# Patient Record
Sex: Female | Born: 1935 | Race: Black or African American | Hispanic: No | Smoking: Never smoker
Health system: Southern US, Community
[De-identification: ages and names within clinical notes are randomized; demographics above are authoritative.]

## PROBLEM LIST (undated history)

## (undated) DIAGNOSIS — R413 Other amnesia: Secondary | ICD-10-CM

## (undated) DIAGNOSIS — M1990 Unspecified osteoarthritis, unspecified site: Secondary | ICD-10-CM

## (undated) DIAGNOSIS — E7800 Pure hypercholesterolemia, unspecified: Secondary | ICD-10-CM

## (undated) DIAGNOSIS — I619 Nontraumatic intracerebral hemorrhage, unspecified: Secondary | ICD-10-CM

## (undated) DIAGNOSIS — G459 Transient cerebral ischemic attack, unspecified: Secondary | ICD-10-CM

## (undated) DIAGNOSIS — I10 Essential (primary) hypertension: Secondary | ICD-10-CM

## (undated) DIAGNOSIS — K589 Irritable bowel syndrome without diarrhea: Secondary | ICD-10-CM

## (undated) DIAGNOSIS — E119 Type 2 diabetes mellitus without complications: Secondary | ICD-10-CM

## (undated) DIAGNOSIS — K219 Gastro-esophageal reflux disease without esophagitis: Secondary | ICD-10-CM

## (undated) DIAGNOSIS — G43909 Migraine, unspecified, not intractable, without status migrainosus: Secondary | ICD-10-CM

## (undated) HISTORY — PX: APPENDECTOMY: SHX54

## (undated) HISTORY — DX: Unspecified osteoarthritis, unspecified site: M19.90

## (undated) HISTORY — DX: Other amnesia: R41.3

## (undated) HISTORY — DX: Irritable bowel syndrome without diarrhea: K58.9

## (undated) HISTORY — DX: Type 2 diabetes mellitus without complications: E11.9

## (undated) HISTORY — DX: Migraine, unspecified, not intractable, without status migrainosus: G43.909

## (undated) HISTORY — PX: ESOPHAGEAL MANOMETRY: SHX1526

## (undated) HISTORY — DX: Gastro-esophageal reflux disease without esophagitis: K21.9

## (undated) HISTORY — DX: Pure hypercholesterolemia, unspecified: E78.00

## (undated) HISTORY — DX: Essential (primary) hypertension: I10

---

## 2000-11-29 ENCOUNTER — Other Ambulatory Visit: Admission: RE | Admit: 2000-11-29 | Discharge: 2000-11-29 | Payer: Self-pay | Admitting: Internal Medicine

## 2001-02-26 ENCOUNTER — Encounter: Payer: Self-pay | Admitting: Family Medicine

## 2001-02-26 ENCOUNTER — Ambulatory Visit (HOSPITAL_COMMUNITY): Admission: RE | Admit: 2001-02-26 | Discharge: 2001-02-26 | Payer: Self-pay | Admitting: Family Medicine

## 2001-06-12 ENCOUNTER — Encounter: Payer: Self-pay | Admitting: Family Medicine

## 2001-06-12 ENCOUNTER — Ambulatory Visit (HOSPITAL_COMMUNITY): Admission: RE | Admit: 2001-06-12 | Discharge: 2001-06-12 | Payer: Self-pay | Admitting: Family Medicine

## 2001-07-13 ENCOUNTER — Emergency Department (HOSPITAL_COMMUNITY): Admission: EM | Admit: 2001-07-13 | Discharge: 2001-07-14 | Payer: Self-pay | Admitting: Emergency Medicine

## 2001-07-13 ENCOUNTER — Encounter: Payer: Self-pay | Admitting: Emergency Medicine

## 2001-08-01 ENCOUNTER — Ambulatory Visit (HOSPITAL_COMMUNITY): Admission: RE | Admit: 2001-08-01 | Discharge: 2001-08-01 | Payer: Self-pay | Admitting: Surgery

## 2001-08-01 ENCOUNTER — Encounter: Payer: Self-pay | Admitting: Surgery

## 2002-03-16 ENCOUNTER — Encounter: Payer: Self-pay | Admitting: Surgery

## 2002-03-16 ENCOUNTER — Ambulatory Visit (HOSPITAL_COMMUNITY): Admission: RE | Admit: 2002-03-16 | Discharge: 2002-03-16 | Payer: Self-pay | Admitting: Surgery

## 2002-06-15 ENCOUNTER — Encounter: Payer: Self-pay | Admitting: Family Medicine

## 2002-06-15 ENCOUNTER — Ambulatory Visit (HOSPITAL_COMMUNITY): Admission: RE | Admit: 2002-06-15 | Discharge: 2002-06-15 | Payer: Self-pay | Admitting: Family Medicine

## 2002-08-05 ENCOUNTER — Encounter: Payer: Self-pay | Admitting: Surgery

## 2002-08-05 ENCOUNTER — Ambulatory Visit (HOSPITAL_COMMUNITY): Admission: RE | Admit: 2002-08-05 | Discharge: 2002-08-05 | Payer: Self-pay | Admitting: Surgery

## 2003-03-30 ENCOUNTER — Ambulatory Visit (HOSPITAL_COMMUNITY): Admission: RE | Admit: 2003-03-30 | Discharge: 2003-03-30 | Payer: Self-pay | Admitting: Internal Medicine

## 2003-03-30 HISTORY — PX: ESOPHAGOGASTRODUODENOSCOPY: SHX1529

## 2003-03-30 HISTORY — PX: COLONOSCOPY: SHX174

## 2003-06-18 ENCOUNTER — Ambulatory Visit (HOSPITAL_COMMUNITY): Admission: RE | Admit: 2003-06-18 | Discharge: 2003-06-18 | Payer: Self-pay | Admitting: Family Medicine

## 2003-07-20 ENCOUNTER — Ambulatory Visit (HOSPITAL_COMMUNITY): Admission: RE | Admit: 2003-07-20 | Discharge: 2003-07-20 | Payer: Self-pay | Admitting: Family Medicine

## 2003-08-03 ENCOUNTER — Ambulatory Visit (HOSPITAL_COMMUNITY): Admission: RE | Admit: 2003-08-03 | Discharge: 2003-08-03 | Payer: Self-pay | Admitting: Surgery

## 2004-03-19 LAB — HM COLONOSCOPY

## 2004-05-21 ENCOUNTER — Emergency Department (HOSPITAL_COMMUNITY): Admission: EM | Admit: 2004-05-21 | Discharge: 2004-05-21 | Payer: Self-pay | Admitting: Emergency Medicine

## 2004-06-05 ENCOUNTER — Ambulatory Visit (HOSPITAL_COMMUNITY): Admission: RE | Admit: 2004-06-05 | Discharge: 2004-06-05 | Payer: Self-pay | Admitting: Family Medicine

## 2004-06-22 ENCOUNTER — Ambulatory Visit (HOSPITAL_COMMUNITY): Admission: RE | Admit: 2004-06-22 | Discharge: 2004-06-22 | Payer: Self-pay | Admitting: Family Medicine

## 2004-08-07 ENCOUNTER — Encounter (HOSPITAL_COMMUNITY): Admission: RE | Admit: 2004-08-07 | Discharge: 2004-09-06 | Payer: Self-pay | Admitting: Family Medicine

## 2004-08-17 HISTORY — PX: OTHER SURGICAL HISTORY: SHX169

## 2004-08-24 ENCOUNTER — Ambulatory Visit (HOSPITAL_COMMUNITY): Admission: RE | Admit: 2004-08-24 | Discharge: 2004-08-24 | Payer: Self-pay | Admitting: General Surgery

## 2005-05-11 ENCOUNTER — Inpatient Hospital Stay (HOSPITAL_COMMUNITY): Admission: EM | Admit: 2005-05-11 | Discharge: 2005-05-17 | Payer: Self-pay | Admitting: Emergency Medicine

## 2005-05-18 ENCOUNTER — Encounter (HOSPITAL_COMMUNITY): Admission: RE | Admit: 2005-05-18 | Discharge: 2005-06-17 | Payer: Self-pay | Admitting: General Surgery

## 2005-06-25 ENCOUNTER — Ambulatory Visit (HOSPITAL_COMMUNITY): Admission: RE | Admit: 2005-06-25 | Discharge: 2005-06-25 | Payer: Self-pay | Admitting: Family Medicine

## 2005-07-25 ENCOUNTER — Ambulatory Visit (HOSPITAL_COMMUNITY): Admission: RE | Admit: 2005-07-25 | Discharge: 2005-07-25 | Payer: Self-pay | Admitting: Family Medicine

## 2005-11-01 ENCOUNTER — Emergency Department (HOSPITAL_COMMUNITY): Admission: EM | Admit: 2005-11-01 | Discharge: 2005-11-01 | Payer: Self-pay | Admitting: Emergency Medicine

## 2006-01-10 ENCOUNTER — Encounter: Admission: RE | Admit: 2006-01-10 | Discharge: 2006-01-10 | Payer: Self-pay | Admitting: Internal Medicine

## 2006-07-23 ENCOUNTER — Ambulatory Visit (HOSPITAL_COMMUNITY): Admission: RE | Admit: 2006-07-23 | Discharge: 2006-07-23 | Payer: Self-pay | Admitting: Family Medicine

## 2007-08-01 ENCOUNTER — Ambulatory Visit (HOSPITAL_COMMUNITY): Admission: RE | Admit: 2007-08-01 | Discharge: 2007-08-01 | Payer: Self-pay | Admitting: Family Medicine

## 2007-11-29 ENCOUNTER — Emergency Department (HOSPITAL_COMMUNITY): Admission: EM | Admit: 2007-11-29 | Discharge: 2007-11-29 | Payer: Self-pay | Admitting: Emergency Medicine

## 2008-07-30 ENCOUNTER — Ambulatory Visit (HOSPITAL_COMMUNITY): Admission: RE | Admit: 2008-07-30 | Discharge: 2008-07-30 | Payer: Self-pay | Admitting: Family Medicine

## 2008-09-27 ENCOUNTER — Emergency Department (HOSPITAL_COMMUNITY): Admission: EM | Admit: 2008-09-27 | Discharge: 2008-09-27 | Payer: Self-pay | Admitting: Emergency Medicine

## 2008-10-06 ENCOUNTER — Emergency Department (HOSPITAL_COMMUNITY): Admission: EM | Admit: 2008-10-06 | Discharge: 2008-10-06 | Payer: Self-pay | Admitting: Emergency Medicine

## 2009-05-16 ENCOUNTER — Encounter: Payer: Self-pay | Admitting: Endocrinology

## 2009-06-13 ENCOUNTER — Ambulatory Visit: Payer: Self-pay | Admitting: Endocrinology

## 2009-06-13 DIAGNOSIS — E119 Type 2 diabetes mellitus without complications: Secondary | ICD-10-CM

## 2009-06-13 DIAGNOSIS — I10 Essential (primary) hypertension: Secondary | ICD-10-CM

## 2009-06-13 DIAGNOSIS — K589 Irritable bowel syndrome without diarrhea: Secondary | ICD-10-CM

## 2009-06-13 DIAGNOSIS — G43909 Migraine, unspecified, not intractable, without status migrainosus: Secondary | ICD-10-CM | POA: Insufficient documentation

## 2009-06-13 DIAGNOSIS — R413 Other amnesia: Secondary | ICD-10-CM

## 2009-06-13 DIAGNOSIS — E7800 Pure hypercholesterolemia, unspecified: Secondary | ICD-10-CM

## 2009-06-13 DIAGNOSIS — K219 Gastro-esophageal reflux disease without esophagitis: Secondary | ICD-10-CM

## 2009-06-13 DIAGNOSIS — M1990 Unspecified osteoarthritis, unspecified site: Secondary | ICD-10-CM | POA: Insufficient documentation

## 2009-06-13 HISTORY — DX: Essential (primary) hypertension: I10

## 2009-06-13 HISTORY — DX: Pure hypercholesterolemia, unspecified: E78.00

## 2009-06-13 HISTORY — DX: Unspecified osteoarthritis, unspecified site: M19.90

## 2009-06-13 HISTORY — DX: Type 2 diabetes mellitus without complications: E11.9

## 2009-06-13 HISTORY — DX: Migraine, unspecified, not intractable, without status migrainosus: G43.909

## 2009-06-13 HISTORY — DX: Other amnesia: R41.3

## 2009-06-13 HISTORY — DX: Irritable bowel syndrome, unspecified: K58.9

## 2009-06-17 LAB — HM MAMMOGRAPHY: HM Mammogram: NORMAL

## 2009-06-27 ENCOUNTER — Ambulatory Visit: Payer: Self-pay | Admitting: Endocrinology

## 2009-08-09 ENCOUNTER — Ambulatory Visit (HOSPITAL_COMMUNITY): Admission: RE | Admit: 2009-08-09 | Discharge: 2009-08-09 | Payer: Self-pay | Admitting: Family Medicine

## 2009-08-26 ENCOUNTER — Ambulatory Visit: Payer: Self-pay | Admitting: Endocrinology

## 2010-04-10 ENCOUNTER — Encounter: Payer: Self-pay | Admitting: Family Medicine

## 2010-04-18 NOTE — Assessment & Plan Note (Signed)
Summary: 2 WK FU  STC   Vital Signs:  Patient profile:   77 year old female Height:      66 inches (167.64 cm) Weight:      141.50 pounds (64.32 kg) O2 Sat:      97 % on Room air Temp:     97.6 degrees F (36.44 degrees C) oral Pulse rate:   93 / minute BP sitting:   136 / 80  (left arm) Cuff size:   regular  Vitals Entered By: Josph Macho RMA (June 27, 2009 9:27 AM)  O2 Flow:  Room air CC: 2 week follow up/ CF Is Patient Diabetic? Yes   Referring Provider:  hill  CC:  2 week follow up/ CF.  History of Present Illness: no cbg record, but states cbg's are in the 100's.  she says it is highest at hs, but it does not go over 200.  she says the cost of a medication is very important.  pt states she feels well in general.   Current Medications (verified): 1)  Alprazolam 0.5 Mg Tabs (Alprazolam) .... 1/2 To 1 Tab 3x Per Day 2)  Imipramine Hcl 10 Mg Tabs (Imipramine Hcl) .... 4 Tab At Bedtime 3)  Simvastatin 40 Mg Tabs (Simvastatin) .Marland Kitchen.. 1 Tab At Bedtime 4)  Verapamil Hcl Cr 120 Mg Xr24h-Cap (Verapamil Hcl) .Marland Kitchen.. 1 Daily 5)  Amitiza 24 Mcg Caps (Lubiprostone) .Marland Kitchen.. 1 Tab 2 Times Per Day 6)  Loratadine 10 Mg Tabs (Loratadine) .... 1/2 Per Day 7)  Phenazopyridine Hcl 100 Mg Tabs (Phenazopyridine Hcl) .Marland Kitchen.. 1 Tab 3 Times Per Day 8)  Aricept 5 Mg Tabs (Donepezil Hcl) .Marland Kitchen.. 1 Tab Per Day 9)  Nexium 40 Mg Cpdr (Esomeprazole Magnesium) .Marland Kitchen.. 1 Tab Per Day 10)  Carafate 1 Gm/68ml Susp (Sucralfate) .... 2 Teaspoons 4x Per Day Before Meals and At Bedtime 11)  Vitamin C 250 Mg Tabs (Ascorbic Acid) .Marland Kitchen.. 1 Per Day 12)  Metformin Hcl 500 Mg Tabs (Metformin Hcl) .... 2 Tabs Two Times A Day 13)  Glyburide 1.25 Mg Tabs (Glyburide) .Marland Kitchen.. 1 Tab Each Am  Allergies (verified): No Known Drug Allergies  Past History:  Past Medical History: Last updated: 06/13/2009 MEMORY LOSS (ICD-780.93) IRRITABLE BOWEL SYNDROME (ICD-564.1) MIGRAINE HEADACHE (ICD-346.90) OSTEOARTHRITIS  (ICD-715.90) HYPERTENSION (ICD-401.9) GERD (ICD-530.81) HYPERCHOLESTEROLEMIA (ICD-272.0) DM (ICD-250.00)  Review of Systems  The patient denies hypoglycemia.    Physical Exam  General:  normal appearance.   Extremities:  no edema    Impression & Recommendations:  Problem # 1:  DM (ICD-250.00) Assessment Improved  Other Orders: Est. Patient Level III (16109)  Patient Instructions: 1)  i told pt we will need to take this complex situation in stages. 2)  check your blood sugar 2 times a day.  vary the time of day when you check, between before the 3 meals, and at bedtime.  also check if you have symptoms of your blood sugar being too high or too low.  please keep a record of the readings and bring it to your next appointment here.  please call us sooner if you are having low blood sugar episodes. 3)  continue: 4)  glyburide 1.25 mg each am 5)  metformin 2x500 mg two times a day 6)  Please schedule a follow-up appointment in 2 months.

## 2010-04-18 NOTE — Assessment & Plan Note (Signed)
Summary: NEW ENDO CON/ [employer] / [employer] [facility]/DM /NWS  #   Vital Signs:  Patient profile:   77 year old female Height:      66 inches Weight:      141 pounds BMI:     22.84 O2 Sat:      98 % on Room air Temp:     97.0 degrees F oral Pulse rate:   87 / minute BP sitting:   136 / 82  (left arm) Cuff size:   regular  Vitals Entered By: Bill Salinas CMA (June 13, 2009 11:00 AM)  O2 Flow:  Room air  Referring Provider:  [facility]   History of Present Illness: pt states 10 years h/o dm.  she denies knowing of any chronic complications.  she has never been on insulin.  she takes glyburide/metformin.  she reportes frequent hypoglycemia, in the afternoon and in the middle of the night.  she brings a record of her cbg's which i have reviewed today.  it is in 100's, except for 200's at hs. pt says her diet and exercise are not good.   symptomatically, pt states 1 year of intermittent numbness of the feet, and associated burning-type pain. she says the cost of a medication is a primary consideration.   Current Medications (verified): 1)  Alprazolam 0.5 Mg Tabs (Alprazolam) .... 1/2 To 1 Tab 3x Per Day 2)  Imipramine Hcl 10 Mg Tabs (Imipramine Hcl) .... 4 Tab At Bedtime 3)  Glyburide-Metformin 2.5-500 Mg Tabs (Glyburide-Metformin) .Marland Kitchen.. 1 Tab 2 Times Per Day 4)  Simvastatin 40 Mg Tabs (Simvastatin) .Marland Kitchen.. 1 Tab At Bedtime 5)  Verapamil Hcl Cr 120 Mg Xr24h-Cap (Verapamil Hcl) .Marland Kitchen.. 1 Daily 6)  Amitiza 24 Mcg Caps (Lubiprostone) .Marland Kitchen.. 1 Tab 2 Times Per Day 7)  Loratadine 10 Mg Tabs (Loratadine) .... 1/2 Per Day 8)  Phenazopyridine Hcl 100 Mg Tabs (Phenazopyridine Hcl) .Marland Kitchen.. 1 Tab 3 Times Per Day 9)  Aricept 5 Mg Tabs (Donepezil Hcl) .Marland Kitchen.. 1 Tab Per Day 10)  Nexium 40 Mg Cpdr (Esomeprazole Magnesium) .Marland Kitchen.. 1 Tab Per Day 11)  Carafate 1 Gm/78ml Susp (Sucralfate) .... 2 Teaspoons 4x Per Day Before Meals and At Bedtime 12)  Vitamin C 250 Mg Tabs (Ascorbic Acid) .Marland Kitchen.. 1 Per Day  Allergies (verified): No  Known Drug Allergies  Past History:  Past Medical History: MEMORY LOSS (ICD-780.93) IRRITABLE BOWEL SYNDROME (ICD-564.1) MIGRAINE HEADACHE (ICD-346.90) OSTEOARTHRITIS (ICD-715.90) HYPERTENSION (ICD-401.9) GERD (ICD-530.81) HYPERCHOLESTEROLEMIA (ICD-272.0) DM (ICD-250.00)  Past Surgical History: Appendectomy (40+ yrs ago)  Family History: Reviewed history and no changes required. dm: father and sister, both deceased  Social History: Reviewed history and no changes required. 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  she reports memory loss, gerd, depression,muscle cramps, easy bruising, rhinorrhea, and headache.  she reports intermittent solid dysphagia.  she has excessive diaphoresis with hypoglycemia.   Physical Exam  General:  normal appearance.   Head:  head: no deformity eyes: no periorbital swelling, no proptosis external nose and ears are normal mouth: no lesion seen Neck:  Supple without thyroid enlargement or tenderness.  Lungs:  Clear to auscultation bilaterally. Normal respiratory effort.  Heart:  Regular rate and rhythm without murmurs or gallops noted. Normal S1,S2.   Abdomen:  abdomen is soft, nontender.  no hepatosplenomegaly.   not distended.  no hernia  Msk:  muscle bulk and strength are grossly normal.  no obvious joint swelling.  gait is normal and steady  Pulses:  dorsalis pedis intact bilat.  no carotid bruit  Extremities:  no deformity.  no ulcer on the feet.  feet are of normal color and temp.  no edema  Neurologic:  cn 2-12 grossly intact.   readily moves all 4's.   sensation is intact to touch on the feet  Skin:  normal texture and temp.  no rash.  not diaphoretic  Cervical Nodes:  No significant adenopathy.  Psych:  Alert and cooperative;  normal mood and affect; normal attention span and concentration.     Impression & Recommendations:  Problem # 1:  DM (ICD-250.00) therapy limited by hypoglycemia  Problem # 2:  INADEQUATE MATERIAL RESOURCES (ICD-V60.2) this limits rx options  Problem # 3:  dysphagia pt says dr hung has already addressed this  Problem # 4:  MEMORY LOSS (ICD-780.93) this limits rx of #1  Medications Added to Medication List This Visit: 1)  Alprazolam 0.5 Mg Tabs (Alprazolam) .... 1/2 to 1 tab 3x per day 2)  Imipramine Hcl 10 Mg Tabs (Imipramine hcl) .... 4 tab at bedtime 3)  Glyburide-metformin 2.5-500 Mg Tabs (Glyburide-metformin) .Marland Kitchen.. 1 tab 2 times per day 4)  Simvastatin 40 Mg Tabs (Simvastatin) .Marland Kitchen.. 1 tab at bedtime 5)  Verapamil Hcl Cr 120 Mg Xr24h-cap (Verapamil hcl) .Marland Kitchen.. 1 daily 6)  Amitiza 24 Mcg Caps (Lubiprostone) .Marland Kitchen.. 1 tab 2 times per day 7)  Loratadine 10 Mg Tabs (Loratadine) .... 1/2 per day 8)  Phenazopyridine Hcl 100 Mg Tabs (Phenazopyridine hcl) .Marland Kitchen.. 1 tab 3 times per day 9)  Aricept 5 Mg Tabs (Donepezil hcl) .Marland Kitchen.. 1 tab per day 10)  Nexium 40 Mg Cpdr (Esomeprazole magnesium) .Marland Kitchen.. 1 tab per day 11)  Carafate 1 Gm/84ml Susp (Sucralfate) .... 2 teaspoons 4x per day before meals and at bedtime 12)  Vitamin C 250 Mg Tabs (Ascorbic acid) .Marland Kitchen.. 1 per day 13)  Metformin Hcl 500 Mg Tabs (Metformin hcl) .... 2 tabs two times a day 14)  Glyburide 1.25 Mg Tabs (Glyburide) .Marland Kitchen.. 1 tab each am  Other Orders: TLB-A1C / Hgb A1C (Glycohemoglobin) (83036-A1C) Consultation Level IV (16109)  Patient Instructions: 1)  we discussed the importance of diet and exercise therapy and the risks of diabetes.  you should see an eye doctor every year. 2)  it is very important to keep good control of blood pressure and cholesterol, especially in those with diabetes.  stopping smoking also reduces the damage diabetes does to your body.  please discuss these with your doctor.  you should take an aspirin every day,  unless you have been advised by a doctor not to. 3)  i told pt we will need to take this complex situation in stages. 4)  check your blood sugar 2 times a day.  vary the time of day when you check, between before the 3 meals, and at bedtime.  also check if you have symptoms of your blood sugar being too high or too low.  please keep a record of the readings and bring it to your next appointment here.  please call us sooner if you are having low blood sugar episodes. 5)  tests are being ordered for you today.  a few days after the test(s), please call [phone] to hear your test results. 6)  pending the test results, please  change glyburide/metformin to: 7)  glyburide 1.25 mg each am 8)  metformin 2x500 mg two times a day 9)  Please schedule a follow-up appointment in 2 weeks. 10)  when the low-blood sugar is resolved, you should consider re-trying the generic aricept Prescriptions: GLYBURIDE 1.25 MG TABS (GLYBURIDE) 1 tab each am  #30 x 11   Entered and Authorized by:   Minus Breeding MD   Signed by:   Minus Breeding MD on 06/13/2009   Method used:   Electronically to        [facility] [address] [phone]* (retail)       [address]       [place], [place]  [address]       Ph: [phone] or [phone]       Fax: [phone]   RxID:   [id number] METFORMIN HCL 500 MG TABS (METFORMIN HCL) 2 tabs two times a day  #120 x 11   Entered and Authorized by:   Minus Breeding MD   Signed by:   Minus Breeding MD on 06/13/2009   Method used:   [facility]ron[facility] to        [facility] [address] [phone]* (retail)       [address]       [place], [place]  [address]       Ph: [phone] or [phone]       Fax: [phone]   RxID:   [id number]   Preventive Care Screening  Pap Smear:    Date:  03/19/2009    Results:  historical   Colonoscopy:    Date:  03/19/2004    Results:  historical

## 2010-04-27 ENCOUNTER — Telehealth: Payer: Self-pay | Admitting: Endocrinology

## 2010-05-01 ENCOUNTER — Encounter: Payer: Self-pay | Admitting: Endocrinology

## 2010-05-04 NOTE — Progress Notes (Signed)
Summary: OV due  Phone Note Outgoing Call Call back at [facility] Phone [phone]   Call placed by: Brenton Grills CMA Duncan Dull),  April 27, 2010 4:13 PM Call placed to: Patient Details for Reason: OV due Summary of Call: Per MD, pt is due for F/U OV for DM. Last OV 06/2009.  Follow-up for Phone Call        left detailed message for pt to schedule F/U OV with SAE Follow-up by: Brenton Grills CMA Duncan Dull),  April 27, 2010 4:14 PM

## 2010-05-04 NOTE — Progress Notes (Signed)
Summary: rx refill req  Phone Note Refill Request Message from:  Fax from Pharmacy on April 27, 2010 4:09 PM  Refills Requested: Medication #1:  METFORMIN HCL 500 MG TABS 2 tabs two times a day   Dosage confirmed as above?Dosage Confirmed   Last Refilled: 06/13/2009 [facility]-90 day supply   Method Requested: Fax to Mail Away Pharmacy Next Appointment Scheduled: none Initial call taken by: Brenton Grills CMA (AAMA),  April 27, 2010 4:10 PM    Prescriptions: METFORMIN HCL 500 MG TABS (METFORMIN HCL) 2 tabs two times a day  #360 x 0   Entered by:   Brenton Grills CMA (AAMA)   Authorized by:   Minus Breeding MD   Signed by:   Brenton Grills CMA (AAMA) on 04/27/2010   Method used:   [facility]             , [place]         Ph:        Fax: [phone]   RxID:   [id number]

## 2010-05-10 NOTE — Medication Information (Signed)
Summary: Metformin / [facility]  Metformin / [facility]   Imported By: Lennie Odor 05/04/2010 09:39:21  _____________________________________________________________________  External Attachment:    Type:   Image     Comment:   External Document

## 2010-05-11 ENCOUNTER — Encounter: Payer: Self-pay | Admitting: Endocrinology

## 2010-05-11 ENCOUNTER — Ambulatory Visit (INDEPENDENT_AMBULATORY_CARE_PROVIDER_SITE_OTHER): Payer: Medicare Other | Admitting: Endocrinology

## 2010-05-11 DIAGNOSIS — E119 Type 2 diabetes mellitus without complications: Secondary | ICD-10-CM

## 2010-05-16 NOTE — Assessment & Plan Note (Signed)
Summary: fu per md/lb   Vital Signs:  Patient profile:   77 year old female Menstrual status:  postmenopausal Height:      66 inches (167.64 cm) Weight:      126.25 pounds (57.39 kg) BMI:     20.45 O2 Sat:      97 % on Room air Temp:     98.6 degrees F (37.00 degrees C) oral Pulse rate:   88 / minute Pulse rhythm:   regular BP sitting:   140 / 80  (left arm) Cuff size:   regular  Vitals Entered By: Brenton Grills CMA Duncan Dull) (May 11, 2010 1:50 PM)  O2 Flow:  Room air CC: Follow up on DM/aj Is Patient Diabetic? Yes     Menstrual Status postmenopausal Last PAP Result historical   Referring Provider:  [facility]  CC:  Follow up on DM/aj.  History of Present Illness: no cbg record, but states cbg's are occasionally low at night.  pt states she feels well in general, except for weight-loss.  she had a gi w/u, which apparently showed only gerd.  Current Medications (verified): 1)  Alprazolam 0.5 Mg Tabs (Alprazolam) .... 1/2 To 1 Tab 3x Per Day 2)  Imipramine Hcl 10 Mg Tabs (Imipramine Hcl) .... 4 Tab At Bedtime 3)  Simvastatin 40 Mg Tabs (Simvastatin) .Marland Kitchen.. 1 Tab At Bedtime 4)  Verapamil Hcl Cr 120 Mg Xr24h-Cap (Verapamil Hcl) .Marland Kitchen.. 1 Daily 5)  Amitiza 24 Mcg Caps (Lubiprostone) .Marland Kitchen.. 1 Tab 2 Times Per Day 6)  Loratadine 10 Mg Tabs (Loratadine) .... 1/2 Per Day 7)  Phenazopyridine Hcl 100 Mg Tabs (Phenazopyridine Hcl) .Marland Kitchen.. 1 Tab 3 Times Per Day 8)  Aricept 5 Mg Tabs (Donepezil Hcl) .Marland Kitchen.. 1 Tab Per Day 9)  Nexium 40 Mg Cpdr (Esomeprazole Magnesium) .Marland Kitchen.. 1 Tab Per Day 10)  Carafate 1 Gm/21ml Susp (Sucralfate) .... 2 Teaspoons 4x Per Day Before Meals and At Bedtime 11)  Vitamin C 250 Mg Tabs (Ascorbic Acid) .Marland Kitchen.. 1 Per Day 12)  Metformin Hcl 500 Mg Tabs (Metformin Hcl) .... 2 Tabs Two Times A Day 13)  Glyburide 1.25 Mg Tabs (Glyburide) .Marland Kitchen.. 1 Tab Each Am 14)  Cozaar 50 Mg Tabs (Losartan Potassium) .Marland Kitchen.. 1 Tablet By Mouth Once Daily  Allergies (verified): No Known Drug  Allergies  Past History:  Past Medical History: Last updated: 06/13/2009 MEMORY LOSS (ICD-780.93) IRRITABLE BOWEL SYNDROME (ICD-564.1) MIGRAINE HEADACHE (ICD-346.90) OSTEOARTHRITIS (ICD-715.90) HYPERTENSION (ICD-401.9) GERD (ICD-530.81) HYPERCHOLESTEROLEMIA (ICD-272.0) DM (ICD-250.00)  Review of Systems  The patient denies syncope.    Physical Exam  General:  normal appearance.   Pulses:  dorsalis pedis intact bilat.    Extremities:  no deformity.  no ulcer on the feet.  feet are of normal color and temp.  no edema Neurologic:  sensation is intact to touch on the feet    Impression & Recommendations:  Problem # 1:  DM (ICD-250.00) therapeutic goal is good control, while mininizing sulfonylurea therapy limited by pt's request for least expensive meds  Medications Added to Medication List This Visit: 1)  Glyburide 1.25 Mg Tabs (Glyburide) .... 1/2 tab each am 2)  Cozaar 50 Mg Tabs (Losartan potassium) .Marland Kitchen.. 1 tablet by mouth once daily  Other Orders: Est. Patient Level III (04540)  Patient Instructions: 1)  i told pt we will need to take this complex situation in stages. 2)  check your blood sugar 2 times a day.  vary the time of day when you check, between before the 3  meals, and at bedtime.  also check if you have symptoms of your blood sugar being too high or too low.  please keep a record of the readings and bring it to your next appointment here.  please call us sooner if you are having low blood sugar episodes. 3)  blood tests are being ordered for you today.  please call [phone] to hear your test results. 4)  pending the test result, please reduce glyburide to 1/2 of 1.25 mg each am, and continue metformin 2x500 mg two times a day 5)  Please schedule a follow-up appointment in 3 months. Prescriptions: GLYBURIDE 1.25 MG TABS (GLYBURIDE) 1/2 tab each am  #45 x 1   Entered and Authorized by:   Minus Breeding MD   Signed by:   Minus Breeding MD on 05/11/2010    Method used:   Electronically to        [facility]* (mail-order)       [address]       [place], [place]  [address]       Ph: [phone]       Fax: [phone]   RxID:   [id number]    Orders Added: 1)  Est. Patient Level III [01027]   Immunization History:  Pneumovax Immunization History:    Pneumovax:  historical (03/19/2005)  Influenza Immunization History:    Influenza:  historical (12/17/2009)  Zostavax History:    Zostavax # 1:  zostavax (03/19/2008)   Immunization History:  Pneumovax Immunization History:    Pneumovax:  Historical (03/19/2005)  Influenza Immunization History:    Influenza:  Historical (12/17/2009)  Zostavax History:    Zostavax # 1:  Zostavax (03/19/2008)   Preventive Care Screening  Mammogram:    Date:  06/17/2009    Results:  normal   Last Tetanus Booster:    Date:  03/19/2005    Results:  Historical

## 2010-06-25 LAB — DIFFERENTIAL
Eosinophils Absolute: 0.1 10*3/uL (ref 0.0–0.7)
Lymphs Abs: 2 10*3/uL (ref 0.7–4.0)
Monocytes Relative: 11 % (ref 3–12)
Neutro Abs: 4.2 10*3/uL (ref 1.7–7.7)
Neutrophils Relative %: 59 % (ref 43–77)

## 2010-06-25 LAB — POCT I-STAT, CHEM 8
Chloride: 102 mEq/L (ref 96–112)
HCT: 40 % (ref 36.0–46.0)
Potassium: 3.9 mEq/L (ref 3.5–5.1)
Sodium: 139 mEq/L (ref 135–145)

## 2010-06-25 LAB — HEMOCCULT GUIAC POC 1CARD (OFFICE): Fecal Occult Bld: NEGATIVE

## 2010-06-25 LAB — CBC
MCV: 85.9 fL (ref 78.0–100.0)
RBC: 4.47 MIL/uL (ref 3.87–5.11)
WBC: 7.1 10*3/uL (ref 4.0–10.5)

## 2010-06-25 LAB — TROPONIN I: Troponin I: 0.01 ng/mL (ref 0.00–0.06)

## 2010-06-25 LAB — APTT: aPTT: 28 seconds (ref 24–37)

## 2010-06-25 LAB — CK TOTAL AND CKMB (NOT AT ARMC): Relative Index: INVALID (ref 0.0–2.5)

## 2010-06-25 LAB — PROTIME-INR: INR: 1 (ref 0.00–1.49)

## 2010-06-25 LAB — SAMPLE TO BLOOD BANK

## 2010-07-01 ENCOUNTER — Other Ambulatory Visit: Payer: Self-pay | Admitting: Endocrinology

## 2010-07-14 ENCOUNTER — Ambulatory Visit (INDEPENDENT_AMBULATORY_CARE_PROVIDER_SITE_OTHER): Payer: Medicare Other | Admitting: Endocrinology

## 2010-07-14 ENCOUNTER — Encounter: Payer: Self-pay | Admitting: Endocrinology

## 2010-07-14 VITALS — BP 136/78 | HR 79 | Temp 98.1°F | Ht 66.0 in | Wt 121.4 lb

## 2010-07-14 DIAGNOSIS — E119 Type 2 diabetes mellitus without complications: Secondary | ICD-10-CM

## 2010-07-14 NOTE — Patient Instructions (Addendum)
Stop glyburide.  Please continue the same metformin.  Please make a follow-up appointment in 1 month. check your blood sugar 1 time a day.  vary the time of day when you check, between before the 3 meals, and at bedtime.  also check if you have symptoms of your blood sugar being too high or too low.  please keep a record of the readings and bring it to your next appointment here.  please call us sooner if you are having low blood sugar episodes.

## 2010-07-14 NOTE — Progress Notes (Signed)
  Subjective:    Patient ID: [name], female    DOB: 05-12-1928, 76 y.o.   MRN: [id number]  HPI she brings a record of her cbg's which i have reviewed today.  She i having frequent mild hypoglycemia, at any time of day.  It is seldom as high as 150.  She has lost 5 lbs since last ov--she says this is due to gerd sxs. Past Medical History  Diagnosis Date  . Memory loss 06/13/2009  . Irritable bowel syndrome 06/13/2009  . MIGRAINE HEADACHE 06/13/2009  . OSTEOARTHRITIS 06/13/2009  . HYPERTENSION 06/13/2009  . GERD 06/13/2009  . HYPERCHOLESTEROLEMIA 06/13/2009  . DM 06/13/2009   Past Surgical History  Procedure Date  . Appendectomy     40+ years ago    reports that she has never smoked. She does not have any smokeless tobacco history on file. She reports that she does not drink alcohol or use illicit drugs. family history includes Diabetes in her father and sister. No Known Allergies   Review of Systems Denies loc    Objective:   Physical Exam GENERAL: no distress Neck - No masses or thyromegaly or limitation in range of motion     Lab Results  Component Value Date   HGBA1C 7.5* 06/13/2009      Assessment & Plan:  Dm is apparently still overcontrolled

## 2010-07-25 ENCOUNTER — Encounter: Payer: Self-pay | Admitting: Endocrinology

## 2010-07-30 ENCOUNTER — Emergency Department (HOSPITAL_COMMUNITY): Payer: Medicare Other

## 2010-07-30 ENCOUNTER — Emergency Department (HOSPITAL_COMMUNITY)
Admission: EM | Admit: 2010-07-30 | Discharge: 2010-07-31 | Disposition: A | Discharge status: Home / Self Care | Payer: Medicare Other | Attending: Emergency Medicine | Admitting: Emergency Medicine

## 2010-07-30 DIAGNOSIS — Z9889 Other specified postprocedural states: Secondary | ICD-10-CM | POA: Insufficient documentation

## 2010-07-30 DIAGNOSIS — E785 Hyperlipidemia, unspecified: Secondary | ICD-10-CM | POA: Insufficient documentation

## 2010-07-30 DIAGNOSIS — K3189 Other diseases of stomach and duodenum: Secondary | ICD-10-CM | POA: Insufficient documentation

## 2010-07-30 DIAGNOSIS — R079 Chest pain, unspecified: Secondary | ICD-10-CM | POA: Insufficient documentation

## 2010-07-30 DIAGNOSIS — E119 Type 2 diabetes mellitus without complications: Secondary | ICD-10-CM | POA: Insufficient documentation

## 2010-07-30 DIAGNOSIS — Z79899 Other long term (current) drug therapy: Secondary | ICD-10-CM | POA: Insufficient documentation

## 2010-07-30 DIAGNOSIS — R1310 Dysphagia, unspecified: Secondary | ICD-10-CM | POA: Insufficient documentation

## 2010-07-30 DIAGNOSIS — R10816 Epigastric abdominal tenderness: Secondary | ICD-10-CM | POA: Insufficient documentation

## 2010-07-30 DIAGNOSIS — I10 Essential (primary) hypertension: Secondary | ICD-10-CM | POA: Insufficient documentation

## 2010-07-30 DIAGNOSIS — K449 Diaphragmatic hernia without obstruction or gangrene: Secondary | ICD-10-CM | POA: Insufficient documentation

## 2010-07-30 DIAGNOSIS — K219 Gastro-esophageal reflux disease without esophagitis: Secondary | ICD-10-CM | POA: Insufficient documentation

## 2010-07-30 LAB — URINALYSIS, ROUTINE W REFLEX MICROSCOPIC
Bilirubin Urine: NEGATIVE
Hgb urine dipstick: NEGATIVE
Ketones, ur: NEGATIVE mg/dL
Nitrite: NEGATIVE
Specific Gravity, Urine: 1.009 (ref 1.005–1.030)
Urobilinogen, UA: 0.2 mg/dL (ref 0.0–1.0)

## 2010-07-30 LAB — DIFFERENTIAL
Basophils Absolute: 0 10*3/uL (ref 0.0–0.1)
Basophils Relative: 0 % (ref 0–1)
Eosinophils Relative: 1 % (ref 0–5)
Lymphocytes Relative: 43 % (ref 12–46)
Monocytes Absolute: 0.8 10*3/uL (ref 0.1–1.0)
Neutro Abs: 3.2 10*3/uL (ref 1.7–7.7)

## 2010-07-30 LAB — COMPREHENSIVE METABOLIC PANEL
ALT: 10 U/L (ref 0–35)
AST: 18 U/L (ref 0–37)
Alkaline Phosphatase: 51 U/L (ref 39–117)
Calcium: 10 mg/dL (ref 8.4–10.5)
GFR calc Af Amer: >60 mL/min (ref >60)
Potassium: 4.1 mEq/L (ref 3.5–5.1)
Sodium: 129 mEq/L — ABNORMAL LOW (ref 135–145)
Total Protein: 6.7 g/dL (ref 6.0–8.3)

## 2010-07-30 LAB — POCT CARDIAC MARKERS
CKMB, poc: <1 ng/mL — ABNORMAL LOW (ref 1.0–8.0)
CKMB, poc: <1 ng/mL — ABNORMAL LOW (ref 1.0–8.0)
Myoglobin, poc: 76.1 ng/mL (ref 12–200)
Troponin i, poc: <0.05 ng/mL (ref 0.00–0.09)
Troponin i, poc: <0.05 ng/mL (ref 0.00–0.09)

## 2010-07-30 LAB — CBC
Hemoglobin: 11.6 g/dL — ABNORMAL LOW (ref 12.0–15.0)
MCHC: 33.9 g/dL (ref 30.0–36.0)
RDW: 13.8 % (ref 11.5–15.5)
WBC: 6.9 10*3/uL (ref 4.0–10.5)

## 2010-07-30 LAB — URINE MICROSCOPIC-ADD ON
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
## 2010-08-04 NOTE — Op Note (Signed)
[name], [name]                 ACCOUNT NO.:  [id number]   MEDICAL RECORD NO.:  [id number]          PATIENT TYPE:  INP   LOCATION:  A320                          FACILITY:  [facility]   PHYSICIAN:  Dirk Dress. Katrinka Blazing, M.D.   DATE OF BIRTH:  29-Jan-1929   DATE OF PROCEDURE:  05/12/2005  DATE OF DISCHARGE:                                 OPERATIVE REPORT   HISTORY:  A 77 year old female with history of severe abscess of the left  hand with severe tenosynovitis, of the dorsal tendon compartment.   PREOP DIAGNOSIS:  Abscess left hand with severe tenosynovitis and  lymphangitis.   POSTOP DIAGNOSIS:  Severe abscess left hand with tenosynovitis and  lymphangitis, left arm.   PROCEDURE:  Wide excision, drainage, and debridement of abscess left hand  and left third finger.   SURGEON:  Dirk Dress. Katrinka Blazing, M.D.   DESCRIPTION:  Under general anesthesia, the patient's left arm and hand were  prepped and draped in a sterile field. Counter incision was made laterally  along the infection of the proximal phalanx. Using blunt dissection, the  abscess cavity was entered. It appeared that the infection extended  circumferentially around the proximal phalanx; and it extended across the  joint space of the proximal interphalangeal joint to the middle phalangeal  joint proper.   The infection extended along the dorsal tendons, especially the extensor  tendons of the left third finger, into the tendon sheath along the dorsal  aspect of the hand. All purulence in this area was suctioned and irrigated.  A counterincision was made along the dorsal aspect of the hand about the  midportion of the hand about halfway between the metacarpal phalangeal joint  and the wrist. Irrigation and suction was carried out in this area. Cultures  were taken. Once hemostasis was achieved. JP drains were placed. A JP drain  was placed through the medial and lateral phalangeal incisions and extended  through the dorsal counter  incision at the midportion of the hand. Another  drain was placed under the skin and subcutaneous tissue of the proximal  phalanx to leave this open for drainage. These were sutured together.  Dressings were placed.   The patient tolerated procedure well. She was awakened from anesthesia,  uneventfully, transferred to a bed; and then taken to the postanesthetic  care unit for further monitoring.      Dirk Dress. Katrinka Blazing, M.D.  Electronically Signed     LCS/MEDQ  D:  05/12/2005  T:  05/12/2005  Job:  [id number]

## 2010-08-04 NOTE — H&P (Signed)
[name], [name]                 ACCOUNT NO.:  [id number]   MEDICAL RECORD NO.:  [id number]          PATIENT TYPE:  INP   LOCATION:  ED99                          FACILITY:  [facility]   PHYSICIAN:  Margaretmary Dys, M.D.DATE OF BIRTH:  01-20-1929   DATE OF ADMISSION:  05/11/2005  DATE OF DISCHARGE:  LH                                HISTORY & PHYSICAL   PRIMARY CARE PHYSICIAN:  Mirna Mires, MD   ADMISSION DIAGNOSES:  1.  Abscess with cellulitis left middle finger.  2. Burn left middle finger.      3. Poorly controlled type 2 diabetes.  4. Poorly controlled      hypertension.  5. Hypokalemia.   CHIEF COMPLAINT:  Swelling and redness in the left middle finger of 2 days'  duration.   HISTORY OF PRESENT ILLNESS:  [name] is a 77 year old African American  female who presented to the emergency room this morning with swelling and  tenderness in her left middle finger. The patient reported having sustained  a burn to her left middle finger while she was making sausage about a week  ago. She did not really take notice of it and dipped it in water. However,  over the last 2 days the swelling has become progressively worse to the  point where she has been draining some pus-like material. She also has had  some fever with some chills. She has not measured her temperature. She went  to see Dr. Katrinka Blazing yesterday who recommended admission and surgery, but the  patient had other social issues to deal with. She was given oral  antibiotics. However, over night the pain became fairly severe. This was  worse. She just had to come to the hospital. She has no signs of nausea. She  vomited early this morning. She has no headaches, dizziness,  lightheadedness, no chest pain, no shortness of breath, no abdominal pain,  no diarrhea.   REVIEW OF SYSTEMS:  Otherwise negative except as mentioned in the History of  Present Illness.   PAST MEDICAL HISTORY:  1.  Hypertension.  2. Diabetes mellitus.  3.  Gastroesophageal reflux      disease.  4. Chronic headaches.  5. Irritable bowel syndrome.  6.      Dyslipidemia.   MEDICATIONS:  This list is probably inaccurate and incomplete, but this is  the most recent available. The patient to bring the list of actual  medications she is taking.  1. Benicar 40 mg p.o. once a day.  2. Verelan PM  100 mg at bed time.  3. Glyburide/metformin 2.5/500 mg one tablet daily.  4.  Imipramine 40 mg at bed time.  5. Nexium 40 mg p.o. once a day.  6. Lescol  XL 80 mg p.o. once a day. The antibiotic prescribed for her by Dr. Katrinka Blazing is  not quite clear.   SOCIAL HISTORY:  The patient is a life long nonsmoker, does not drink  alcohol. She has one daughter. She is fairly independent of activities of  daily living and remains very active taking care of a friend of hers.  FAMILY HISTORY:  Unremarkable, however, noncontributory to History of  Present Illness.   PHYSICAL EXAMINATION:  General: Conscious, alert, comfortable, not in acute  distress, very pleasant. Vital signs: Blood pressure 162/84, temperature  97.6, pulse 79, respiratory rate 22, oxygen saturation 98% on room air.  HEENT: Normocephalic, atraumatic. Oral mucosa was moist. Neck: Supple, no  JVD, lymphadenopathy or adenopathy. Lungs: Clear clinically with good air  entry bilaterally. Heart: S1, S2 regular. No S3, S4, gallops or rubs.  Abdomen: Soft, nontender, bowel sounds positive. No masses palpable.  Extremities: No pitting pedal edema. The patient has a significantly swollen  left middle finger with fluctuancy and was actively draining pus-like  material from the punctum. Area of cellulitis extends to the proximal wrist  joint. The patient reports that this has been getting worse. CNS: Grossly  intact.   LABORATORY DATA:  White blood cell count was 15,500; hemoglobin 11.4;  hematocrit 34.7; platelet count 247 with neutrophils 82%. PT 14.2, INR 1.1,  PTT 30. Sodium 135, potassium 3.4, chloride  103, CO2 23, glucose 190, BUN  12, creatinine 1.0, calcium 8.9. Culture of the wound has been done.   ASSESSMENT AND PLAN:  [name] is a 77 year old African American  female with poorly controlled hypertension and diabetes who presents to the  emergency room with swelling, an abscess and cellulitis of her left middle  finger. The patient will be admitted to the hospital at this time and will  request a consult with Dr. Katrinka Blazing as soon as possible. I will start her on  Zosyn 3.375 g IV q.6 to provide broad coverage for gram-positive and gram-  negative organisms since she is diabetic. This will provide some anaerobic  coverage. I will put her on a sliding scale. At this time I will resume her  oral hypoglycemic agents. We will get a more accurate list of her  medications either from her primary care physician or from her patient's  family bringing in the actual prescription bottles.   Her tetanus status is unknown. We will clarify this with Dr. Loleta Chance. If he is  unclear we will proceed with giving a tetanus toxoid shot. We will keep the  left hand elevated for now. The patient will be on monitored insulin sliding  scale NovoLog. Pain control will be with Dilaudid.   I have discussed the above plan with the patient. She verbalized full  understanding.   CODE STATUS:  She is a full code.      Margaretmary Dys, M.D.  Electronically Signed     AM/MEDQ  D:  05/11/2005  T:  05/11/2005  Job:  [id number]   cc:   Annia Friendly. Loleta Chance, MD  Fax: [phone]

## 2010-08-04 NOTE — H&P (Signed)
NAMEMY, RINKE                             ACCOUNT NO.:  [id number]   MEDICAL RECORD NO.:  [id number]                  PATIENT TYPE:   LOCATION:                                       FACILITY:   PHYSICIAN:  R. Roetta Sessions, M.D.              DATE OF BIRTH:  1928/10/11   DATE OF ADMISSION:  DATE OF DISCHARGE:                                HISTORY & PHYSICAL   CHIEF COMPLAINT:  1. Screening colonoscopy.  2. Gastroesophageal reflux disease.  3. Dysphagia.   HISTORY OF PRESENT ILLNESS:  [name] is a 77 year old African-  American female who presents to our office for evaluation of the above.  She  states that she has an approximately 9-10 year history of chronic GERD.  She  complaints of heartburn, chest pressure, regurgitation, and reflux.  She has  been on Nexium for 4-5 years now, and prior to that she was on Prilosec.  Over the last several months, she has noted intermittent solid food  dysphagia.  She denies any problems with liquids.  She also notes increased  flatulence, as well as occasional abdominal cramping.  She does have a  history of constipation, and is currently taking Peri-Colace b.i.d., which  results in typically a daily bowel movement.  Prior to this, she was having  bowel movements once or twice a week.  She denies any hematochezia or  melena.  She denies any nausea or vomiting.  She has never had a  colonoscopy.  She does report a remote history of peptic ulcer disease in  1978, and has had an EGD; however, it has been many years ago.   PAST MEDICAL HISTORY:  1. Peptic ulcer disease in 1978.  Questionable EGD confirmation.     Questionable H. pylori status.  2. Chronic GERD.  3. Hypertension.  4. Hyperlipidemia.  5. Migraine headaches.  6. Diabetes mellitus.  7. Reportedly, [name] has a liver hemangioma, which was evidenced on CT     scan last year; however, I do not have a copy of this report.   PAST SURGICAL HISTORY:  Laparoscopy for  endometriosis many years ago.   CURRENT MEDICATIONS:  1. Tarka 2/240 mg daily.  2. Phenazopyridine 100 mg t.i.d.  3. Nexium 40 mg daily.  4. Aspirin 81 mg daily.  5. Peri-Colace one tablet b.i.d.  6. Imipramine hydrochloride 10 mg q.i.d.  7. Glyburide one daily.  8. Lescol XL 80 mg q.h.s.  9. Calcium 600 mg daily.  10.      Tylenol arthritis p.r.n.   ALLERGIES:  CAFFEINE (migraines).   FAMILY HISTORY:  No known family history of colorectal carcinoma, liver, or  chronic GI problems.  Mother is deceased at age 38 secondary to CAD, and had  a history of hypertension.  Father is deceased at age 78 with CVA.  She has  three sisters in good health; however, one does  have breast carcinoma.  Two  brothers with history of CAD, as well.   SOCIAL HISTORY:  [name] is currently divorced and lives with her grown  daughter, son-in-law, and granddaughter.  She is retired from [employer].  She denies any tobacco, alcohol, or drug use.   REVIEW OF SYSTEMS:  CONSTITUTIONAL:  Weight is stable.  Denies any fever or  chills.  Appetite is good.  Energy is good.  CARDIOVASCULAR:  Occasional  palpitations with episodes of anxiety; however, she denies any chest pain.  PULMONARY:  Denies any shortness of breath, dyspnea, or hemoptysis.  GENITOURINARY:  Denies dysuria, hematuria, or increased urinary frequency.  GI:  See history of present illness.  SKIN:  Denies any rash or jaundice.   PHYSICAL EXAMINATION:  VITAL SIGNS:  Weight 150 pounds, height 66 inches,  temperature 97.1, blood pressure 138/80, pulse 68.  GENERAL:  [name] is a 77 year old African-American female who is  alert, pleasant, oriented, and cooperative.  She is well-developed, well-  nourished, in no acute distress.  HEENT:  Sclerae are clear.  Nonicteric.  Conjunctivae pink.  Oropharynx pink  and moist without any lesions.  NECK:  Supple without any mass or thyromegaly.  CHEST:  Heart regular rate and rhythm  with normal S1 and S2.  LUNGS:  Clear to auscultation bilaterally.  ABDOMEN:  Positive bowel sounds x4, soft, nontender, nondistended.  No  palpable mass or organomegaly.  RECTAL:  Deferred.  EXTREMITIES:  There were 2+ pedal pulses bilaterally.  No edema.  SKIN:  Brown, warm, and dry, without any rash or jaundice.   LABORATORY DATA:  The most recent laboratories were from May of 2004 with  normal metabolic panel, LFT's completely normal, and hyperlipidemia with  total cholesterol of 225, and triglycerides of 232.   ASSESSMENT:  1. [name] is a 77 year old African-American female with chronic     gastroesophageal reflux disease:  It is worrisome that she continues to     have problems, despite being on daily PPI therapy.  Also, her     gastroesophageal reflux disease has been present for approximately 10     years now, and therefore warrants further evaluation with EGD to rule out     Barrett's esophagus.  Also, given history of intermittent solid food     dysphagia, would be concerned about development of stricture or ring, and     may possibly need esophageal dilatation.  2. Constipation:  Appears to be stable at this time; however, she has not     had screening colonoscopy.  Ultimately, would like to rule out colorectal     carcinoma prior to any further treatment for constipation.   RECOMMENDATIONS:  1. Will schedule screening colonoscopy, as well as EGD with possible ED to     be performed by Dr. Jena Gauss an [facility].  I have discussed both     procedures, including risks and benefits which include, but are not     limited to, bleeding, perforation, and infection, with [name].  She     agrees with this plan.  Consent will be obtained.  We have asked her to     hold her aspirin for three days prior to the procedure, as well as half     her dose of glyburide the day prior and of the procedure. 2. She is to continue Nexium 40 mg daily for now.  3. Further  recommendations pending procedure results.  We would like to thank Dr. Loleta Chance for allowing [place] to participate in the care  of [name].     _____________________________________  ___________________________________________  Nicholas Lose, N.P.               Jonathon Bellows, M.D.   KC/MEDQ  D:  03/18/2003  T:  03/18/2003  Job:  [id number]   cc:   Annia Friendly. Loleta Chance, M.D.  [address]  [place]  [place] [address]  Fax: [phone]

## 2010-08-04 NOTE — H&P (Signed)
**Note [name]** [name], [name]                 ACCOUNT NO.:  [id number]   MEDICAL RECORD NO.:  [id number]          PATIENT TYPE:  AMB   LOCATION:  DAY                           FACILITY:  [facility]   PHYSICIAN:  Jerolyn Shin C. Katrinka Blazing, M.D.   DATE OF BIRTH:  June 06, 1928   DATE OF ADMISSION:  DATE OF DISCHARGE:  LH                                HISTORY & PHYSICAL   A 77 year old female with a history of severe constipation, increasing  indigestion, bloating, and heartburn.  She has been evaluated with upper  abdominal ultrasound which was normal.  She has not had any black stools or  rectal bleeding.  No family history of colon cancer.  The patient is  undergoing colonoscopy for evaluation of her progressive constipation.   PAST HISTORY:  1.  Hypertension.  2.  Diabetes mellitus.  3.  Gastroesophageal reflux disease.  4.  Chronic headaches.   MEDICATIONS:  1.  Benicar 40 mg daily.  2.  Verelan PM 100 mg at bedtime.  3.  Glyburide/Metformin 2.5/500 daily.  4.  Imipramine 40 mg at bedtime.  5.  Nexium 40 mg daily.  6.  Lescol XL 80 mg daily.   PHYSICAL EXAMINATION:  VITAL SIGNS:  Blood pressure 148/90, pulse 84,  respirations 20, weight 141 pounds.  HEENT:  Unremarkable.  NECK:  Supple.  No JVD, bruit, adenopathy, or thyromegaly.  CHEST:  Clear to auscultation.  HEART:  Regular rate and rhythm without murmur, gallop or rub.  ABDOMEN:  Soft, nontender, no masses.  EXTREMITIES:  No cyanosis, clubbing, or edema.  NEUROLOGIC:  No focal motor, sensory, or cerebellar deficit.   IMPRESSION:  1.  Severe constipation.  2.  Hypertension.  3.  Diabetes mellitus.  4.  Gastroesophageal reflux disease.   PLAN:  Scheduled for colonoscopy.       LCS/MEDQ  D:  08/23/2004  T:  08/23/2004  Job:  [id number]   cc:   [facility]  Fax: [phone]

## 2010-08-04 NOTE — Consult Note (Signed)
[name], [name]                 ACCOUNT NO.:  [id number]   MEDICAL RECORD NO.:  [id number]          PATIENT TYPE:  INP   LOCATION:  A320                          FACILITY:  [facility]   PHYSICIAN:  Dirk Dress. Katrinka Blazing, M.D.   DATE OF BIRTH:  04/24/1928   DATE OF CONSULTATION:  05/11/2005  DATE OF DISCHARGE:                                   CONSULTATION   CHIEF COMPLAINT:  This 77 year old female admitted with severe abscess and  tenderness in the right upper left hand.   HISTORY:  The patient has a history of uncontrolled diabetes mellitus. She  sustained a burn about 2 weeks ago while cooking. She states that the wound  was healing nicely but she evidently hit it about 4-5 days ago and noted  that it the proximal phalanx of the middle finger was reddened. She states  that the redness rapidly increased  to involve the middle and proximal  phalanx as well as the dorsum of her hand. The patient was seen in the  office yesterday and was noted to have a major abscess of the left finger  with tenosynovitis, and lymphangitis up the dorsal lateral aspect of her  arm. She was advised to be admitted but because she was caring for another  individual, she stated that she could not be admitted. The patient was given  Claforan 500 mg and started on Doxycycline with the understanding that she  would come back on a daily basis for intravenous medicine until she could be  admitted because the area needed to be drained. She states that during the  evening that her pain became much more severe she noted there was more  swelling and that there was some purulence. She came to the emergency room  and was subsequently admitted by the Hospitalist service. She now has  evidence of more severe tenosynovitis and lymphangitis and she has  leukocytosis with a white count of 15,000. The patient has been started on  intravenous antibiotics and she is counseled for wide excision and drainage  of the finger and hand in  the morning.   PAST MEDICAL HISTORY:  1.  She has uncontrolled diabetes mellitus.  2.  Hypertension.  3.  History of migraine headaches.  4.  Gastroesophageal reflux disease.  5.  Peptic ulcer disease.   MEDICATION LIST:  1.  Benicar 40 mg daily.  2.  Imipramine 40 mg q.h.s.  3.  Amitiza one tablet b.i.d.  4.  Glyburide & metformin 2.5/500 b.i.d.  5.  Aspirin 325 mg daily.  6.  Nexium 40 mg daily.  7.  Lescol XL 80 mg at h.s.  8.  Verelan PM 100 mg daily.   SURGERY:  Diagnostic laparoscopy.   SOCIAL HISTORY:  She is retired from [employer]. She is  divorced. She does not smoke, drink or use drugs.   FAMILY HISTORY:  Positive for coronary artery disease, hypertension, breast  cancer and history of stroke.   PHYSICAL EXAMINATION:  VITAL SIGNS:  Blood pressure is 160/80, pulse 80,  respirations 18, temperature 99.4.  HEENT:  Unremarkable.  NECK:  Supple, no JVD, bruit, adenopathy or thyromegaly.  CHEST:  Clear to auscultation.  HEART:  Regular rate and rhythm without murmur, rub, or gallop.  ABDOMEN:  Soft, nondistended, no masses and normal active bowel sounds.  EXTREMITIES:  Normal extremity exam except for the left upper extremity. She  has a reddened, inflamed and swollen left middle finger with a purulent  drainage from the dorsal aspect of the proximal phalanx with cellulitis  extending distally to the distal phalanx and proximally over the dorsum of  the hand to the wrist with a streaking lymphangitic spread up to her elbow  over the dorsal lateral aspect of her arm. She cannot bend the middle  finger, motion of the other fingers appears to be okay.  NEUROLOGIC EXAM:  No focal motor, sensory or cerebellar deficit.   IMPRESSION:  1.  Abscess left middle finger with tenosynovitis of the left hand and      lymphangitis.  2.  Uncontrolled diabetes mellitus.  3.  Hypertension.  4.  History of migraine headaches.  5.  Gastroesophageal reflux disease.  6.   Peptic ulcer disease.   PLAN:  The patient has already been started on appropriate antibiotics. She  has been counseled to have wide debridement and drainage of her finger and  hand. We may have to drain the dorsal aspect of the hand based on  intraoperative findings. Drains will probably have to be placed. The patient  has been thoroughly counseled for this procedure and it is scheduled for the  a.m.      Dirk Dress. Katrinka Blazing, M.D.  Electronically Signed     LCS/MEDQ  D:  05/11/2005  T:  05/11/2005  Job:  [id number]

## 2010-08-04 NOTE — Op Note (Signed)
NAME:  [name], [name]                           ACCOUNT NO.:  [id number]   MEDICAL RECORD NO.:  [id number]                   PATIENT TYPE:  AMB   LOCATION:  DAY                                  FACILITY:  [facility]   PHYSICIAN:  R. Roetta Sessions, M.D.              DATE OF BIRTH:  [birth year]   DATE OF PROCEDURE:  03/30/2003  DATE OF DISCHARGE:                                 OPERATIVE REPORT   PROCEDURE:  Esophagogastroduodenoscopy with esophageal dilation followed by  attempted colonoscopy.   ENDOSCOPIST:  Gerrit Friends. Rourk, M.D.   INDICATIONS FOR PROCEDURE:  The patient is a 77 year old lady with  longstanding gastroesophageal reflux symptoms.  She is on Nexium 40 mg  orally daily.  If she watches what she eats she really does well.  When she  has dietary indiscretions she has symptoms.  She has had some intermittent  esophageal dysphagia but tells me that it has gotten better recently.  She  has not had a colonoscopy.  She is here for a screening colonoscopy as well.  EGD and colonoscopy are now being done.  This approach has been discussed  with the patient at length.  The potential risks, benefits, and alternatives  have been reviewed.  Please see my March 08, 2003 consultation note for  more information.   PROCEDURE NOTE:  O2 saturation, blood pressure, pulse and respirations were  monitored throughout the entirety of both procedures.  Conscious sedation:  Versed 4 mg IV, Demerol 50 mg IV in divided doses for both procedures.   INSTRUMENT:  Olympus pediatric colonoscope and a GI-140 gastroscope.   EGD FINDINGS:  Examination of the tubular esophagus revealed a Schatzki  ring.  The esophageal mucosa otherwise appeared normal.  The EG junction was  easily traversed.   STOMACH:  The gastric cavity was empty.  It insufflated well with air.  A  thorough examination of the gastric mucosa was undertaken.  The pylorus was  patent and easily traversed.  Examination of the bulb and second  portion  revealed no abnormalities.  The scope was pulled back into the stomach and a  retroflex view of the proximal stomach and esophagogastric junction was  undertaken.  The patient had a small hiatal hernia.   As I retroflexed, I noted the up-down controls became limber and the wheel  freely spun without further movement.  It was noted that the tip of the  gastroscope was fixed under tension in the retroflex position.  The up-down  wheel was not locked and there was no maneuver that I could deal with the  control knobs to straighten the scope out.  We called the [employer] emergency  line and they had no suggestions other than trying to run the cold biopsy  forceps through the scope channel which I did, but this was not helpful in  straightening the scope out.  I did pull the scope  back to where the angle-  tip was up at the EG junction.  I applied further gentle pressure and noted  that the scope did straighten out although tension remained on the tip of  the scope as I pulled it into the distal esophagus.  There was not a great  deal of undue tension there and I continued to pull the scope and it was  ultimately removed from the patient.  A look back with the 160 scope  revealed some excoriation at the EG junction.  The ring had been dilated,  but the tubular esophagus otherwise appeared fine.   The patient tolerated the procedure well and was prepared for colonoscopy.  A digital rectal exam revealed no abnormalities.   ENDOSCOPIC FINDINGS:  The prep was poor.   RECTUM:  Examination of the rectal mucosa including the retroflex view of  the anal verge revealed no gross abnormalities. I advanced the scope into  the left colon.  There was a large granular semi-formed fluid occupying over  half of the colon and the colon lumen.  The pediatric scope kept getting  clogged up as I attempted to wash and suction the fecal material out and it  was clear that the patient was too poorly prepped  to completion of the  examination.  Therefore, the scope was removed/procedure terminated.   The patient tolerated the both procedures well and was reacted in endoscopy.   EGD IMPRESSION:  1. Schatzki ring otherwise normal esophagus.  2. Small hiatal hernia.  3. Normal stomach.  4. Normal D1 and D2.  5. Status post dilation of the ring in an unusual manner, as described     above.   COLONOSCOPY FINDINGS:  1. Poor prep precluded completion of the examination.   RECOMMENDATIONS:  1. The patient is to continue Nexium 40 mg orally daily.  2. Antireflux measures emphasized.  3. The patient is to be rescheduled and reprepped.  She needs an adequate     prep to complete her colonoscopy.  4. Today's malfunctioning scope will be investigated further.      ___________________________________________                                            Jonathon Bellows, M.D.   RMR/MEDQ  D:  03/30/2003  T:  03/30/2003  Job:  [id number]   cc:   Annia Friendly. Loleta Chance, M.D.  [address]  [place]  [place] [address]  Fax: [phone]   R. Roetta Sessions, M.D.  [address]    [place] [address]  Fax: [phone]

## 2010-08-10 ENCOUNTER — Ambulatory Visit (INDEPENDENT_AMBULATORY_CARE_PROVIDER_SITE_OTHER): Payer: Medicare Other | Admitting: Endocrinology

## 2010-08-10 ENCOUNTER — Ambulatory Visit: Payer: No Typology Code available for payment source | Admitting: Endocrinology

## 2010-08-10 ENCOUNTER — Encounter: Payer: Self-pay | Admitting: Endocrinology

## 2010-08-10 DIAGNOSIS — E119 Type 2 diabetes mellitus without complications: Secondary | ICD-10-CM

## 2010-08-10 NOTE — Patient Instructions (Addendum)
Please make a follow-up appointment in 2 months. check your blood sugar 1 time a day.  vary the time of day when you check, between before the 3 meals, and at bedtime.  also check if you have symptoms of your blood sugar being too high or too low.  please keep a record of the readings and bring it to your next appointment here.  please call us sooner if you are having low blood sugar episodes.

## 2010-08-10 NOTE — Progress Notes (Signed)
Subjective:    Patient ID: [name], female    DOB: June 11, 1928, 77 y.o.   MRN: [id number]  HPI Pt says she has had no hypoglycemia since last ov.  she brings a record of her cbg's which i have reviewed today.  It varies from 91-high-100's, with no trend throughout the day.   Past Medical History  Diagnosis Date  . Memory loss 06/13/2009  . Irritable bowel syndrome 06/13/2009  . MIGRAINE HEADACHE 06/13/2009  . OSTEOARTHRITIS 06/13/2009  . HYPERTENSION 06/13/2009  . GERD 06/13/2009  . HYPERCHOLESTEROLEMIA 06/13/2009  . DM 06/13/2009    Past Surgical History  Procedure Date  . Appendectomy     40+ years ago    History   Social History  . Marital Status: Divorced    Spouse Name: N/A    Number of Children: N/A  . Years of Education: N/A   Occupational History  .      Retired   Social History Main Topics  . Smoking status: Never Smoker   . Smokeless tobacco: Not on file  . Alcohol Use: No  . Drug Use: No  . Sexually Active: Not on file   Other Topics Concern  . Not on file   Social History Narrative  . No narrative on file    Current Outpatient Prescriptions on File Prior to Visit  Medication Sig Dispense Refill  . Ascorbic Acid (VITAMIN C) 250 MG tablet Take 250 mg by mouth daily.        . Calcium-Magnesium-Zinc (CALCIUM/MAGNESIUM/ZINC FORMULA) 1000-500-50 MG TABS Take 1 tablet by mouth 2 (two) times daily.        . Cholecalciferol (VITAMIN D) 2000 UNITS CAPS Take 1 capsule by mouth daily.        Marland Kitchen denosumab (PROLIA) 60 MG/ML SOLN Inject 60 mg into the skin. Every 6 months       . donepezil (ARICEPT) 5 MG tablet Take 5 mg by mouth daily.        Marland Kitchen esomeprazole (NEXIUM) 40 MG capsule Take 40 mg by mouth daily before breakfast.        . imipramine (TOFRANIL) 10 MG tablet 4 tablets at bedtime       . losartan (COZAAR) 50 MG tablet Take 50 mg by mouth daily.        Marland Kitchen lubiprostone (AMITIZA) 24 MCG capsule Take 24 mcg by mouth 2 (two) times daily with a meal.        .  metFORMIN (GLUCOPHAGE) 500 MG tablet TAKE 2 TABLETS BY MOUTH TWO TIMES A DAY  120 tablet  3  . simvastatin (ZOCOR) 40 MG tablet Take 40 mg by mouth at bedtime.        . sucralfate (CARAFATE) 1 GM/10ML suspension 2 teaspoons four times per day before meals and at bedtime       . verapamil (VERELAN PM) 120 MG 24 hr capsule Take 120 mg by mouth at bedtime.          No Known Allergies  Family History  Problem Relation Age of Onset  . Diabetes Father   . Diabetes Sister     BP 124/70  Pulse 86  Temp(Src) 98.4 F (36.9 C) (Oral)  Ht 5\' 4"  (1.626 m)  Wt 118 lb (53.524 kg)  BMI 20.25 kg/m2  SpO2 97%    Review of Systems denies hypoglycemia    Objective:   Physical Exam GENERAL: no distress Pulses: dorsalis pedis intact bilat.   Feet: no deformity.  no ulcer on the feet.  feet are of normal color and temp.  no edema Neuro: sensation is intact to touch on the feet.       Assessment & Plan:  Dm.  Pt declines fructosamine today.

## 2010-08-15 ENCOUNTER — Ambulatory Visit: Payer: Medicare Other | Admitting: Endocrinology

## 2010-08-17 ENCOUNTER — Other Ambulatory Visit: Payer: Self-pay | Admitting: *Deleted

## 2010-08-17 MED ORDER — METFORMIN HCL 500 MG PO TABS: ORAL_TABLET | ORAL | Status: DC

## 2010-08-17 NOTE — Telephone Encounter (Signed)
R'cd fax from [facility] for refill of pt's Metformin  Last OV-08/10/2010

## 2010-08-17 NOTE — Telephone Encounter (Signed)
Rx faxed to [facility]

## 2010-08-28 ENCOUNTER — Other Ambulatory Visit (HOSPITAL_COMMUNITY): Payer: Self-pay | Admitting: Family Medicine

## 2010-08-28 DIAGNOSIS — Z139 Encounter for screening, unspecified: Secondary | ICD-10-CM

## 2010-09-07 ENCOUNTER — Ambulatory Visit (HOSPITAL_COMMUNITY)
Admission: RE | Admit: 2010-09-07 | Discharge: 2010-09-07 | Disposition: A | Discharge status: Home / Self Care | Payer: Medicare Other | Source: Ambulatory Visit | Attending: Family Medicine | Admitting: Family Medicine

## 2010-09-07 DIAGNOSIS — Z139 Encounter for screening, unspecified: Secondary | ICD-10-CM

## 2010-09-07 DIAGNOSIS — Z1231 Encounter for screening mammogram for malignant neoplasm of breast: Secondary | ICD-10-CM | POA: Insufficient documentation

## 2010-10-11 ENCOUNTER — Encounter: Payer: Self-pay | Admitting: Endocrinology

## 2010-10-11 ENCOUNTER — Other Ambulatory Visit (INDEPENDENT_AMBULATORY_CARE_PROVIDER_SITE_OTHER): Payer: Medicare Other

## 2010-10-11 ENCOUNTER — Ambulatory Visit (INDEPENDENT_AMBULATORY_CARE_PROVIDER_SITE_OTHER): Payer: Medicare Other | Admitting: Endocrinology

## 2010-10-11 VITALS — BP 130/78 | HR 81 | Temp 98.0°F | Ht 64.0 in | Wt 116.4 lb

## 2010-10-11 DIAGNOSIS — E119 Type 2 diabetes mellitus without complications: Secondary | ICD-10-CM

## 2010-10-11 LAB — HEMOGLOBIN A1C: Hgb A1c MFr Bld: 8.2 % — ABNORMAL HIGH (ref 4.6–6.5)

## 2010-10-11 MED ORDER — SITAGLIPTIN PHOSPHATE 100 MG PO TABS: 100.0000 mg | ORAL_TABLET | Freq: Every day | ORAL | Status: DC

## 2010-10-11 NOTE — Progress Notes (Signed)
Subjective:    Patient ID: [name], female    DOB: 1928-04-06, 77 y.o.   MRN: [id number]  HPI pt states she feels well in general, except for gerd sxs (she sees gi).  she brings a record of her cbg's which i have reviewed today.  It varies from 108-260, but most are in the low-100's.  Pt says the cost of a medication is very important to her. Past Medical History  Diagnosis Date  . Memory loss 06/13/2009  . Irritable bowel syndrome 06/13/2009  . MIGRAINE HEADACHE 06/13/2009  . OSTEOARTHRITIS 06/13/2009  . HYPERTENSION 06/13/2009  . GERD 06/13/2009  . HYPERCHOLESTEROLEMIA 06/13/2009  . DM 06/13/2009    Past Surgical History  Procedure Date  . Appendectomy     40+ years ago    History   Social History  . Marital Status: Divorced    Spouse Name: N/A    Number of Children: N/A  . Years of Education: N/A   Occupational History  .      Retired   Social History Main Topics  . Smoking status: Never Smoker   . Smokeless tobacco: Not on file  . Alcohol Use: No  . Drug Use: No  . Sexually Active: Not on file   Other Topics Concern  . Not on file   Social History Narrative  . No narrative on file    Current Outpatient Prescriptions on File Prior to Visit  Medication Sig Dispense Refill  . Ascorbic Acid (VITAMIN C) 250 MG tablet Take 250 mg by mouth daily.        . Calcium-Magnesium-Zinc (CALCIUM/MAGNESIUM/ZINC FORMULA) 1000-500-50 MG TABS Take 1 tablet by mouth 2 (two) times daily.        . Cholecalciferol (VITAMIN D) 2000 UNITS CAPS Take 1 capsule by mouth daily.        Marland Kitchen denosumab (PROLIA) 60 MG/ML SOLN Inject 60 mg into the skin. Every 6 months       . donepezil (ARICEPT) 5 MG tablet Take 5 mg by mouth daily.        Marland Kitchen esomeprazole (NEXIUM) 40 MG capsule Take 40 mg by mouth daily before breakfast.        . imipramine (TOFRANIL) 10 MG tablet 4 tablets at bedtime       . losartan (COZAAR) 50 MG tablet Take 50 mg by mouth daily.        Marland Kitchen lubiprostone (AMITIZA) 24 MCG  capsule Take 24 mcg by mouth 2 (two) times daily with a meal.        . metFORMIN (GLUCOPHAGE) 500 MG tablet TAKE 2 TABLETS BY MOUTH TWO TIMES A DAY  360 tablet  1  . simvastatin (ZOCOR) 40 MG tablet Take 40 mg by mouth at bedtime.        . verapamil (VERELAN PM) 120 MG 24 hr capsule Take 120 mg by mouth at bedtime.          No Known Allergies  Family History  Problem Relation Age of Onset  . Diabetes Father   . Diabetes Sister     BP 130/78  Pulse 81  Temp(Src) 98 F (36.7 C) (Oral)  Ht 5\' 4"  (1.626 m)  Wt 116 lb 6.4 oz (52.799 kg)  BMI 19.98 kg/m2  SpO2 97%   Review of Systems Denies change in her weight    Objective:   Physical Exam GENERAL: no distress Neck - No masses or thyromegaly or limitation in range of motion  Lab Results  Component Value Date   HGBA1C 8.2* 10/11/2010   Assessment & Plan:  Dm, needs increased rx.

## 2010-10-11 NOTE — Patient Instructions (Addendum)
Please make a follow-up appointment in 3 months. check your blood sugar 1 time a day.  vary the time of day when you check, between before the 3 meals, and at bedtime.  also check if you have symptoms of your blood sugar being too high or too low.  please keep a record of the readings and bring it to your next appointment here.  please call us sooner if you are having low blood sugar episodes.  blood tests are being ordered for you today.  please call [phone] to hear your test results.  You will be prompted to enter the 9-digit "MRN" number that appears at the top left of this page, followed by #.  Then you will hear the message. Based on the results, i may prescribe an additional medication for diabetes (update: i left message on phone-tree:  add onglyza 100/d).

## 2010-10-16 ENCOUNTER — Other Ambulatory Visit: Payer: Self-pay

## 2010-10-16 MED ORDER — SITAGLIPTIN PHOSPHATE 100 MG PO TABS: 100.0000 mg | ORAL_TABLET | Freq: Every day | ORAL | Status: DC

## 2010-10-16 NOTE — Telephone Encounter (Signed)
Pt's daughter called requesting Rx for Januvia be sent to pt's local pharmacy because there is a delay in processing through mail order pharmacy

## 2010-11-06 ENCOUNTER — Telehealth: Payer: Self-pay

## 2010-11-06 MED ORDER — GLIMEPIRIDE 4 MG PO TABS: 4.0000 mg | ORAL_TABLET | Freq: Every day | ORAL | Status: DC

## 2010-11-06 NOTE — Telephone Encounter (Signed)
Daughter advised and pt scheduled for appt.

## 2010-11-06 NOTE — Telephone Encounter (Signed)
Pt's daughter called stating that pt's CBG have been elevated since starting Januvia, 4 readings above 200 and 1 over 300. Daughter is concerned saying that pt can have elevated reading it is usually not this frequent, please advise.

## 2010-11-06 NOTE — Telephone Encounter (Signed)
please call patient: Add glimepiride 4 mg each morning Ret here in 2-4 days

## 2010-11-06 NOTE — Telephone Encounter (Signed)
Left message on machine for pt to return my call  

## 2010-11-10 ENCOUNTER — Ambulatory Visit (INDEPENDENT_AMBULATORY_CARE_PROVIDER_SITE_OTHER): Payer: Medicare Other | Admitting: Endocrinology

## 2010-11-10 ENCOUNTER — Encounter: Payer: Self-pay | Admitting: Endocrinology

## 2010-11-10 DIAGNOSIS — E119 Type 2 diabetes mellitus without complications: Secondary | ICD-10-CM

## 2010-11-10 MED ORDER — GLIMEPIRIDE 2 MG PO TABS: 2.0000 mg | ORAL_TABLET | Freq: Every day | ORAL | Status: DC

## 2010-11-10 NOTE — Progress Notes (Signed)
Subjective:    Patient ID: [name], female    DOB: [birth year], 77 y.o.   MRN: [id number]  HPI Pt stopped the metformin when she started the onglyza.  no cbg record, but states cbg's 51-317.  It is higher as the day goes on.  pt states she feels well in general. Past Medical History  Diagnosis Date  . Memory loss 06/13/2009  . Irritable bowel syndrome 06/13/2009  . MIGRAINE HEADACHE 06/13/2009  . OSTEOARTHRITIS 06/13/2009  . HYPERTENSION 06/13/2009  . GERD 06/13/2009  . HYPERCHOLESTEROLEMIA 06/13/2009  . DM 06/13/2009    Past Surgical History  Procedure Date  . Appendectomy     40+ years ago    History   Social History  . Marital Status: Divorced    Spouse Name: N/A    Number of Children: N/A  . Years of Education: N/A   Occupational History  .      Retired   Social History Main Topics  . Smoking status: Never Smoker   . Smokeless tobacco: Not on file  . Alcohol Use: No  . Drug Use: No  . Sexually Active: Not on file   Other Topics Concern  . Not on file   Social History Narrative  . No narrative on file    Current Outpatient Prescriptions on File Prior to Visit  Medication Sig Dispense Refill  . Ascorbic Acid (VITAMIN C) 250 MG tablet Take 250 mg by mouth daily.        . Calcium-Magnesium-Zinc (CALCIUM/MAGNESIUM/ZINC FORMULA) 1000-500-50 MG TABS Take 1 tablet by mouth 2 (two) times daily.        . Cholecalciferol (VITAMIN D) 2000 UNITS CAPS Take 1 capsule by mouth daily.        Marland Kitchen denosumab (PROLIA) 60 MG/ML SOLN Inject 60 mg into the skin. Every 6 months       . donepezil (ARICEPT) 5 MG tablet Take 5 mg by mouth daily.        Marland Kitchen esomeprazole (NEXIUM) 40 MG capsule Take 40 mg by mouth daily before breakfast.        . imipramine (TOFRANIL) 10 MG tablet 4 tablets at bedtime       . losartan (COZAAR) 50 MG tablet Take 50 mg by mouth daily.        Marland Kitchen lubiprostone (AMITIZA) 24 MCG capsule Take 24 mcg by mouth 2 (two) times daily with a meal.        . metFORMIN  (GLUCOPHAGE) 500 MG tablet TAKE 2 TABLETS BY MOUTH TWO TIMES A DAY  360 tablet  1  . simvastatin (ZOCOR) 40 MG tablet Take 40 mg by mouth at bedtime.        . sitaGLIPtin (JANUVIA) 100 MG tablet Take 1 tablet (100 mg total) by mouth daily.  30 tablet  11  . verapamil (VERELAN PM) 120 MG 24 hr capsule Take 120 mg by mouth at bedtime.         No Known Allergies  Family History  Problem Relation Age of Onset  . Diabetes Father   . Diabetes Sister    BP 126/70  Pulse 80  Temp(Src) 97.9 F (36.6 C) (Oral)  Ht 5\' 4"  (1.626 m)  Wt 115 lb 1.9 oz (52.218 kg)  BMI 19.76 kg/m2  SpO2 98%  Review of Systems No loc    Objective:   Physical Exam GENERAL: no distress Pulses: dorsalis pedis intact bilat.   Feet: no deformity, except there are overlapping toes.Marland Kitchen  no  ulcer on the feet.  feet are of normal color and temp.  no edema Neuro: sensation is intact to touch on the feet    Assessment & Plan:  Dm.  She needs all 3 dm meds.  However, she should minimize the amaryl.

## 2010-11-10 NOTE — Patient Instructions (Addendum)
Please make a follow-up appointment in 2 months. Resume the metformin. Reduce the glimepiride to 2 mg each morning.  i have sent a prescription to your pharmacy.

## 2010-11-18 DIAGNOSIS — K219 Gastro-esophageal reflux disease without esophagitis: Secondary | ICD-10-CM

## 2010-11-18 HISTORY — PX: 24 HOUR PH STUDY: SHX5419

## 2010-11-18 HISTORY — DX: Gastro-esophageal reflux disease without esophagitis: K21.9

## 2010-12-20 LAB — URINALYSIS, ROUTINE W REFLEX MICROSCOPIC
Bilirubin Urine: NEGATIVE
Glucose, UA: 250 — AB
Hgb urine dipstick: NEGATIVE
Ketones, ur: NEGATIVE
Protein, ur: NEGATIVE
pH: 7

## 2010-12-20 LAB — BASIC METABOLIC PANEL
BUN: 11
CO2: 30
Chloride: 105
Creatinine, Ser: 0.99
Glucose, Bld: 215 — ABNORMAL HIGH
Potassium: 4.6

## 2010-12-20 LAB — GLUCOSE, CAPILLARY: Glucose-Capillary: 248 — ABNORMAL HIGH

## 2011-01-10 ENCOUNTER — Encounter: Payer: Self-pay | Admitting: Endocrinology

## 2011-01-10 ENCOUNTER — Ambulatory Visit (INDEPENDENT_AMBULATORY_CARE_PROVIDER_SITE_OTHER): Payer: Medicare Other | Admitting: Endocrinology

## 2011-01-10 ENCOUNTER — Other Ambulatory Visit (INDEPENDENT_AMBULATORY_CARE_PROVIDER_SITE_OTHER): Payer: Medicare Other

## 2011-01-10 VITALS — BP 122/70 | HR 79 | Temp 98.4°F | Ht 64.0 in | Wt 123.0 lb

## 2011-01-10 DIAGNOSIS — E119 Type 2 diabetes mellitus without complications: Secondary | ICD-10-CM

## 2011-01-10 DIAGNOSIS — Z23 Encounter for immunization: Secondary | ICD-10-CM

## 2011-01-10 MED ORDER — GLIMEPIRIDE 1 MG PO TABS: 1.0000 mg | ORAL_TABLET | Freq: Every day | ORAL | Status: DC

## 2011-01-10 NOTE — Progress Notes (Signed)
  Subjective:    Patient ID: [name], female    DOB: 21-Sep-1928, 77 y.o.   MRN: [id number]  HPI pt states she feels well in general.  she brings a record of her cbg's which i have reviewed today.  It varies from 65-130.   There is no trend throughout the day. Past Medical History  Diagnosis Date  . Memory loss 06/13/2009  . Irritable bowel syndrome 06/13/2009  . MIGRAINE HEADACHE 06/13/2009  . OSTEOARTHRITIS 06/13/2009  . HYPERTENSION 06/13/2009  . GERD 06/13/2009  . HYPERCHOLESTEROLEMIA 06/13/2009  . DM 06/13/2009    Past Surgical History  Procedure Date  . Appendectomy     40+ years ago    History   Social History  . Marital Status: Divorced    Spouse Name: N/A    Number of Children: N/A  . Years of Education: N/A   Occupational History  .      Retired   Social History Main Topics  . Smoking status: Never Smoker   . Smokeless tobacco: Not on file  . Alcohol Use: No  . Drug Use: No  . Sexually Active: Not on file   Other Topics Concern  . Not on file   Social History Narrative  . No narrative on file    Current Outpatient Prescriptions on File Prior to Visit  Medication Sig Dispense Refill  . Ascorbic Acid (VITAMIN C) 250 MG tablet Take 250 mg by mouth daily.        . Calcium-Magnesium-Zinc (CALCIUM/MAGNESIUM/ZINC FORMULA) 1000-500-50 MG TABS Take 1 tablet by mouth 2 (two) times daily.        . Cholecalciferol (VITAMIN D) 2000 UNITS CAPS Take 1 capsule by mouth daily.        Marland Kitchen esomeprazole (NEXIUM) 40 MG capsule Take 40 mg by mouth 2 (two) times daily.       Marland Kitchen imipramine (TOFRANIL) 10 MG tablet 4 tablets at bedtime       . losartan (COZAAR) 50 MG tablet Take 50 mg by mouth daily.        . metFORMIN (GLUCOPHAGE) 500 MG tablet TAKE 2 TABLETS BY MOUTH TWO TIMES A DAY  360 tablet  1  . simvastatin (ZOCOR) 40 MG tablet Take 40 mg by mouth at bedtime.        . sitaGLIPtin (JANUVIA) 100 MG tablet Take 1 tablet (100 mg total) by mouth daily.  30 tablet  11  .  verapamil (VERELAN PM) 120 MG 24 hr capsule Take 120 mg by mouth at bedtime.        . donepezil (ARICEPT) 5 MG tablet Take 5 mg by mouth daily.          No Known Allergies  Family History  Problem Relation Age of Onset  . Diabetes Father   . Diabetes Sister     BP 122/70  Pulse 79  Temp(Src) 98.4 F (36.9 C) (Oral)  Ht 5\' 4"  (1.626 m)  Wt 123 lb (55.792 kg)  BMI 21.11 kg/m2  SpO2 98%   Review of Systems Denies loc    Objective:   Physical Exam VITAL SIGNS:  See vs page GENERAL: no distress Gait: normal and steady  Lab Results  Component Value Date   HGBA1C 6.8* 01/10/2011       Assessment & Plan:  Dm, overcontrolled given this sulfonylurea-containing regimen.

## 2011-01-10 NOTE — Patient Instructions (Addendum)
Please make a follow-up appointment in 3 months. Please try to take the metformin. Reduce the glimepiride to 1 mg each morning.  i have sent a prescription to your pharmacy.  You can use-up the 2 mg pills, by taking 1/2 each morning.   check your blood sugar 1 time a day.  vary the time of day when you check, between before the 3 meals, and at bedtime.  also check if you have symptoms of your blood sugar being too high or too low.  please keep a record of the readings and bring it to your next appointment here.  please call us sooner if you are having low blood sugar episodes, or if it stays over 200. (update: i left message on phone-tree:  rx as we discussed)

## 2011-02-27 ENCOUNTER — Telehealth: Payer: Self-pay | Admitting: *Deleted

## 2011-02-27 NOTE — Telephone Encounter (Signed)
Left message for pt's daughter to callback office.

## 2011-02-27 NOTE — Telephone Encounter (Signed)
Pt's daughter called to report that pt has had two hypoglycemic episodes this morning (50's around 1:00am and 67 most recently).  Pt's daughter also states that pt's sister recently passed away and she has been under some stress lately and is wondering if this could be contributing to pt's hypoglycemic episodes-please advise.

## 2011-02-27 NOTE — Telephone Encounter (Signed)
Reduce glimepiride to 1/2 of 1 mg qd.  Call if low-blood sugar persists on this.

## 2011-02-27 NOTE — Telephone Encounter (Signed)
Pt's daughter informed of MD's advisement.  

## 2011-03-22 ENCOUNTER — Other Ambulatory Visit: Payer: Self-pay | Admitting: *Deleted

## 2011-03-22 MED ORDER — SITAGLIPTIN PHOSPHATE 100 MG PO TABS: 100.0000 mg | ORAL_TABLET | Freq: Every day | ORAL | Status: DC

## 2011-03-22 MED ORDER — METFORMIN HCL 500 MG PO TABS: ORAL_TABLET | ORAL | Status: DC

## 2011-03-22 MED ORDER — GLIMEPIRIDE 1 MG PO TABS: 1.0000 mg | ORAL_TABLET | Freq: Every day | ORAL | Status: DC

## 2011-03-22 NOTE — Telephone Encounter (Signed)
Pt's daughter called on behalf of pt. Pt needs all DM meds sent to [facility] mail order [facility]. Rx sent, pt's daughter informed.

## 2011-04-03 ENCOUNTER — Telehealth: Payer: Self-pay | Admitting: *Deleted

## 2011-04-03 NOTE — Telephone Encounter (Signed)
Pt's daughter called to state that pt is still having hypoglycemic episodes in the mornings (CBG's in the 60's). Pt is still taking 1/2 tablet of Glimepiride 1mg  daily. Pt's daughter is asking for MD's advisement regarding CBG's.

## 2011-04-03 NOTE — Telephone Encounter (Signed)
Ov is sched for next week.  Until then, see if you can cut the glimepiride to 1/4 tab qd.

## 2011-04-03 NOTE — Telephone Encounter (Signed)
Pt's daughter informed of MD's advisement.  

## 2011-04-12 ENCOUNTER — Other Ambulatory Visit (INDEPENDENT_AMBULATORY_CARE_PROVIDER_SITE_OTHER): Payer: Medicare Other

## 2011-04-12 ENCOUNTER — Ambulatory Visit (INDEPENDENT_AMBULATORY_CARE_PROVIDER_SITE_OTHER): Payer: Medicare Other | Admitting: Endocrinology

## 2011-04-12 ENCOUNTER — Encounter: Payer: Self-pay | Admitting: Endocrinology

## 2011-04-12 VITALS — BP 132/72 | HR 81 | Temp 97.1°F | Ht 64.0 in | Wt 126.2 lb

## 2011-04-12 DIAGNOSIS — E119 Type 2 diabetes mellitus without complications: Secondary | ICD-10-CM

## 2011-04-12 LAB — HEMOGLOBIN A1C: Hgb A1c MFr Bld: 6.6 % — ABNORMAL HIGH (ref 4.6–6.5)

## 2011-04-12 NOTE — Progress Notes (Signed)
Subjective:    Patient ID: [name], female    DOB: [birth year], 78 y.o.   MRN: [id number]  HPI Pt returns for f/u of type 2 DM (2000).  She has been taking metformin only 500 mg qd, because she fears it will cause hypoglycemia.  She takes amaryl 1/4 of 1 mg qd.  she brings a record of her cbg's which i have reviewed today.  Almost all are below 100.   Past Medical History  Diagnosis Date  . Memory loss 06/13/2009  . Irritable bowel syndrome 06/13/2009  . MIGRAINE HEADACHE 06/13/2009  . OSTEOARTHRITIS 06/13/2009  . HYPERTENSION 06/13/2009  . GERD 06/13/2009  . HYPERCHOLESTEROLEMIA 06/13/2009  . DM 06/13/2009    Past Surgical History  Procedure Date  . Appendectomy     40+ years ago    History   Social History  . Marital Status: Divorced    Spouse Name: N/A    Number of Children: N/A  . Years of Education: N/A   Occupational History  .      Retired   Social History Main Topics  . Smoking status: Never Smoker   . Smokeless tobacco: Not on file  . Alcohol Use: No  . Drug Use: No  . Sexually Active: Not on file   Other Topics Concern  . Not on file   Social History Narrative  . No narrative on file    Current Outpatient Prescriptions on File Prior to Visit  Medication Sig Dispense Refill  . Ascorbic Acid (VITAMIN C) 250 MG tablet Take 250 mg by mouth daily.        . Calcium-Magnesium-Zinc (CALCIUM/MAGNESIUM/ZINC FORMULA) 1000-500-50 MG TABS Take 1 tablet by mouth 2 (two) times daily.        . Cholecalciferol (VITAMIN D) 2000 UNITS CAPS Take 1 capsule by mouth daily.        Marland Kitchen esomeprazole (NEXIUM) 40 MG capsule Take 40 mg by mouth every morning.       Marland Kitchen losartan (COZAAR) 50 MG tablet Take 50 mg by mouth daily.        . metFORMIN (GLUCOPHAGE) 500 MG tablet TAKE 2 TABLETS BY MOUTH TWO TIMES A DAY  360 tablet  2  . simvastatin (ZOCOR) 40 MG tablet Take 40 mg by mouth at bedtime.        . sitaGLIPtin (JANUVIA) 100 MG tablet Take 1 tablet (100 mg total) by mouth daily.   90 tablet  2  . verapamil (VERELAN PM) 120 MG 24 hr capsule Take 120 mg by mouth at bedtime.          No Known Allergies  Family History  Problem Relation Age of Onset  . Diabetes Father   . Diabetes Sister     BP 132/72  Pulse 81  Temp(Src) 97.1 F (36.2 C) (Oral)  Ht 5\' 4"  (1.626 m)  Wt 126 lb 3.2 oz (57.244 kg)  BMI 21.66 kg/m2  SpO2 97%    Review of Systems Denies hypoglycemia since she last reduced the amaryl.      Objective:   Physical Exam VITAL SIGNS:  See vs page GENERAL: no distress Pulses: dorsalis pedis intact bilat.   Feet: no deformity.  no ulcer on the feet.  feet are of normal color and temp.  no edema.   Neuro: sensation is intact to touch on the feet.     Lab Results  Component Value Date   HGBA1C 6.6* 04/12/2011      Assessment &  Plan:  DM is overcontrolled, given this sulfonylurea-containing regimen

## 2011-04-12 NOTE — Patient Instructions (Addendum)
Please make a follow-up appointment in 3 months. Please try to take the metformin 2 pills, 2x a day.  Continue the [place].   Stop glimepiride.  check your blood sugar 1 time a day.  vary the time of day when you check, between before the 3 meals, and at bedtime.  also check if you have symptoms of your blood sugar being too high or too low.  please keep a record of the readings and bring it to your next appointment here.  please call us sooner if you are having low blood sugar episodes, or if it stays over 200.

## 2011-07-10 ENCOUNTER — Other Ambulatory Visit: Payer: Self-pay

## 2011-07-10 MED ORDER — GLUCOSE BLOOD VI STRP: 1.0000 | ORAL_STRIP | Freq: Two times a day (BID) | Status: DC | PRN

## 2011-08-03 ENCOUNTER — Other Ambulatory Visit (HOSPITAL_COMMUNITY): Payer: Self-pay | Admitting: Family Medicine

## 2011-08-03 DIAGNOSIS — Z139 Encounter for screening, unspecified: Secondary | ICD-10-CM

## 2011-08-30 ENCOUNTER — Telehealth: Payer: Self-pay | Admitting: *Deleted

## 2011-08-30 NOTE — Telephone Encounter (Signed)
Ov is due.  Let's address options then

## 2011-08-30 NOTE — Telephone Encounter (Signed)
Left message for pt's daughter to callback and scheduled OV for mother as per MD's advisement.

## 2011-08-30 NOTE — Telephone Encounter (Signed)
Patient's daughter called reporting that pt's CBGs are elevated [running in the 170s]; she believes that pt is testing in the Am fasting & in evening post meal at bedtime, but not sure. Went over medications and directions, but daughter is concerned that patient made need OV. Please advise.

## 2011-08-31 ENCOUNTER — Other Ambulatory Visit (INDEPENDENT_AMBULATORY_CARE_PROVIDER_SITE_OTHER): Payer: Medicare Other

## 2011-08-31 ENCOUNTER — Encounter: Payer: Self-pay | Admitting: Endocrinology

## 2011-08-31 ENCOUNTER — Ambulatory Visit (INDEPENDENT_AMBULATORY_CARE_PROVIDER_SITE_OTHER): Payer: Medicare Other | Admitting: Endocrinology

## 2011-08-31 VITALS — BP 102/70 | HR 83 | Temp 97.7°F | Ht 64.0 in | Wt 113.0 lb

## 2011-08-31 DIAGNOSIS — E119 Type 2 diabetes mellitus without complications: Secondary | ICD-10-CM

## 2011-08-31 LAB — HEMOGLOBIN A1C: Hgb A1c MFr Bld: 7.1 % — ABNORMAL HIGH (ref 4.6–6.5)

## 2011-08-31 NOTE — Progress Notes (Signed)
Subjective:    Patient ID: [name], female    DOB: [birth year], 78 y.o.   MRN: [id number]  HPI Pt returns for f/u of type 2 DM (dx'ed 2000; no known complications).  pt states she feels well in general.  she brings a record of her cbg's which i have reviewed today.  It varies from 78-200, but most are in the low-100's.   Past Medical History  Diagnosis Date  . Memory loss 06/13/2009  . Irritable bowel syndrome 06/13/2009  . MIGRAINE HEADACHE 06/13/2009  . OSTEOARTHRITIS 06/13/2009  . HYPERTENSION 06/13/2009  . GERD 06/13/2009  . HYPERCHOLESTEROLEMIA 06/13/2009  . DM 06/13/2009    Past Surgical History  Procedure Date  . Appendectomy     40+ years ago    History   Social History  . Marital Status: Divorced    Spouse Name: N/A    Number of Children: N/A  . Years of Education: N/A   Occupational History  .      Retired   Social History Main Topics  . Smoking status: Never Smoker   . Smokeless tobacco: Not on file  . Alcohol Use: No  . Drug Use: No  . Sexually Active: Not on file   Other Topics Concern  . Not on file   Social History Narrative  . No narrative on file    Current Outpatient Prescriptions on File Prior to Visit  Medication Sig Dispense Refill  . ALPRAZolam (XANAX) 0.5 MG tablet Take 0.5 mg by mouth 3 (three) times daily.      . Ascorbic Acid (VITAMIN C) 250 MG tablet Take 250 mg by mouth daily.        . Calcium-Magnesium-Zinc (CALCIUM/MAGNESIUM/ZINC FORMULA) 1000-500-50 MG TABS Take 1 tablet by mouth 2 (two) times daily.        . Cholecalciferol (VITAMIN D) 2000 UNITS CAPS Take 1 capsule by mouth daily.        Marland Kitchen esomeprazole (NEXIUM) 40 MG capsule Take 40 mg by mouth every morning.       Marland Kitchen glucose blood (ONE TOUCH TEST STRIPS) test strip 1 each by Other route 2 (two) times daily as needed for other.  100 each  5  . imipramine (TOFRANIL) 10 MG tablet Take 40 mg by mouth at bedtime.      Marland Kitchen losartan (COZAAR) 50 MG tablet Take 50 mg by mouth daily.          . metFORMIN (GLUCOPHAGE) 500 MG tablet TAKE 2 TABLETS BY MOUTH TWO TIMES A DAY  360 tablet  2  . simvastatin (ZOCOR) 40 MG tablet Take 40 mg by mouth at bedtime.        . sitaGLIPtin (JANUVIA) 100 MG tablet Take 1 tablet (100 mg total) by mouth daily.  90 tablet  2  . verapamil (VERELAN PM) 120 MG 24 hr capsule Take 120 mg by mouth at bedtime.        . bromocriptine (PARLODEL) 2.5 MG tablet Take 1 tablet (2.5 mg total) by mouth at bedtime.  30 tablet  11    No Known Allergies  Family History  Problem Relation Age of Onset  . Diabetes Father   . Diabetes Sister     BP 102/70  Pulse 83  Temp 97.7 F (36.5 C) (Oral)  Ht 5\' 4"  (1.626 m)  Wt 113 lb (51.256 kg)  BMI 19.40 kg/m2  SpO2 98%    Review of Systems denies hypoglycemia    Objective:   Physical  Exam VITAL SIGNS:  See vs page GENERAL: no distress Pulses: dorsalis pedis intact bilat.   Feet: no deformity.  no ulcer on the feet.  feet are of normal color and temp.  no edema Neuro: sensation is intact to touch on the feet. Lab Results  Component Value Date   HGBA1C 7.1* 08/31/2011      Assessment & Plan:  DM.  Needs increased rx, if it can be done with a regimen that avoids or minimizes hypoglycemia.

## 2011-08-31 NOTE — Patient Instructions (Addendum)
Please make a follow-up appointment in 3 months.   Continue the [place] and metformin check your blood sugar 1 time a day.  vary the time of day when you check, between before the 3 meals, and at bedtime.  also check if you have symptoms of your blood sugar being too high or too low.  please keep a record of the readings and bring it to your next appointment here.  please call us sooner if you are having low blood sugar episodes, or if it stays over 200.

## 2011-09-01 ENCOUNTER — Encounter: Payer: Self-pay | Admitting: Endocrinology

## 2011-09-01 MED ORDER — BROMOCRIPTINE MESYLATE 2.5 MG PO TABS: 2.5000 mg | ORAL_TABLET | Freq: Every day | ORAL | Status: DC

## 2011-09-03 ENCOUNTER — Telehealth: Payer: Self-pay | Admitting: Internal Medicine

## 2011-09-03 NOTE — Telephone Encounter (Signed)
This medication was approved may 2009 by the FDA for Diabetes, as it helps those who need a milder medication to avoid the risk of low sugars;  I would encourage pt to take as I reviewed the record, and this is intent of Dr Everardo All

## 2011-09-03 NOTE — Telephone Encounter (Signed)
Caller: Winifert/Child; PCP: Romero Belling;  Call regarding Daughter Has Questions and concerns About the Medication Bromocriptine That Dr. Everardo All Prescribed on 08/21/11.She is not sure why she is on this med - and is not going to let her take it until futher explanation given. Willard went to last appnt on her own with a friend d/t blood sugars being elevated and Rx this med. Daughter concerned b/c med is used to treat Parkinsons Disease and may increase Blood Pressure. Please call her and let her know. CB#: [phone]. Medication Questions Protocol.

## 2011-09-04 ENCOUNTER — Other Ambulatory Visit: Payer: Self-pay | Admitting: Endocrinology

## 2011-09-04 ENCOUNTER — Telehealth: Payer: Self-pay | Admitting: *Deleted

## 2011-09-04 NOTE — Telephone Encounter (Signed)
The patients daughter would like to have this addressed by Dr. Everardo All as she is concerned about interactions of her other medications. I did inform of Dr. Raphael Gibney information on this medication, but would still prefer to have Dr. Everardo All address once he returns next week.

## 2011-09-04 NOTE — Telephone Encounter (Signed)
Yes, this med is for diabetes.

## 2011-09-04 NOTE — Telephone Encounter (Signed)
Called pt to inform of lab results, pt informed (letter also mailed to pt). 

## 2011-09-04 NOTE — Telephone Encounter (Signed)
Left message for pt's daughter to callback office.  

## 2011-09-05 ENCOUNTER — Other Ambulatory Visit: Payer: Self-pay | Admitting: Endocrinology

## 2011-09-05 NOTE — Telephone Encounter (Signed)
Pt's daughter informed of MD's advisement via VM and to callback office with any questions/concerns.

## 2011-09-11 ENCOUNTER — Ambulatory Visit (HOSPITAL_COMMUNITY)
Admission: RE | Admit: 2011-09-11 | Discharge: 2011-09-11 | Disposition: A | Discharge status: Home / Self Care | Payer: Medicare Other | Source: Ambulatory Visit | Attending: Family Medicine | Admitting: Family Medicine

## 2011-09-11 DIAGNOSIS — Z139 Encounter for screening, unspecified: Secondary | ICD-10-CM

## 2011-09-11 DIAGNOSIS — Z1231 Encounter for screening mammogram for malignant neoplasm of breast: Secondary | ICD-10-CM | POA: Insufficient documentation

## 2011-10-22 ENCOUNTER — Telehealth: Payer: Self-pay | Admitting: Endocrinology

## 2011-10-22 NOTE — Telephone Encounter (Signed)
No samples at this time. Daughter advised via VM

## 2011-10-22 NOTE — Telephone Encounter (Signed)
Daughter, Winifert calling to see if the office has any more samples of Nexium 40 mg that she can come bay get for her mother the patient.  Please call her and let her know.

## 2011-12-12 LAB — CMP AND LIVER
ALT: 10 U/L (ref 7–35)
AST: 17 U/L
Alkaline Phosphatase: 37 U/L

## 2011-12-21 ENCOUNTER — Other Ambulatory Visit: Payer: Self-pay | Admitting: Endocrinology

## 2011-12-21 NOTE — Telephone Encounter (Signed)
Pt requesting a refill of Cozaar, this is a historical med. Pt last seen on 08/31/11.

## 2011-12-21 NOTE — Telephone Encounter (Signed)
Dr Renae Gloss prescribes this

## 2011-12-31 ENCOUNTER — Encounter: Payer: Self-pay | Admitting: Internal Medicine

## 2012-01-01 ENCOUNTER — Encounter: Payer: Self-pay | Admitting: Gastroenterology

## 2012-01-01 ENCOUNTER — Ambulatory Visit (INDEPENDENT_AMBULATORY_CARE_PROVIDER_SITE_OTHER): Payer: Medicare Other | Admitting: Gastroenterology

## 2012-01-01 VITALS — BP 122/69 | HR 88 | Temp 97.5°F | Ht 66.0 in | Wt 122.8 lb

## 2012-01-01 DIAGNOSIS — R109 Unspecified abdominal pain: Secondary | ICD-10-CM

## 2012-01-01 DIAGNOSIS — K219 Gastro-esophageal reflux disease without esophagitis: Secondary | ICD-10-CM

## 2012-01-01 DIAGNOSIS — R1310 Dysphagia, unspecified: Secondary | ICD-10-CM

## 2012-01-01 DIAGNOSIS — R634 Abnormal weight loss: Secondary | ICD-10-CM

## 2012-01-01 DIAGNOSIS — K5900 Constipation, unspecified: Secondary | ICD-10-CM

## 2012-01-01 DIAGNOSIS — K921 Melena: Secondary | ICD-10-CM

## 2012-01-01 MED ORDER — POLYETHYLENE GLYCOL 3350 17 GM/SCOOP PO POWD: 17.0000 g | Freq: Every day | ORAL | Status: DC

## 2012-01-01 MED ORDER — ESOMEPRAZOLE MAGNESIUM 40 MG PO CPDR: 40.0000 mg | DELAYED_RELEASE_CAPSULE | Freq: Two times a day (BID) | ORAL | Status: DC

## 2012-01-01 MED ORDER — HYDROCORTISONE ACETATE 25 MG RE SUPP: 25.0000 mg | Freq: Two times a day (BID) | RECTAL | Status: DC

## 2012-01-01 NOTE — Patient Instructions (Addendum)
Please start taking Nexium twice a day, 30 minutes before breakfast and dinner. Samples have been provided, and I have sent the prescription to your pharmacy.   Start taking Miralax once a day for constipation. Hold if loose stools.  Anusol suppositories for your hemorrhoids have been sent to the pharmacy. Take this for 7 days.   I am retrieving your outside records to determine what further procedures need to be done.  I have set you up for a CT scan of your belly. You will need to have blood drawn prior to this to make sure your kidneys are ok before receiving the dye contrast. After the scan, do not take metformin for 48 hours. Then, you may resume this.  We will be in touch shortly regarding the next steps!!!

## 2012-01-01 NOTE — Progress Notes (Signed)
Primary Care Physician:  Evlyn Courier, MD Primary Gastroenterologist:  Dr. Jena Gauss   Chief Complaint  Patient presents with  . Dysphagia    HPI:   Presents today at request of Dr. Loleta Chance. Last TCS by Dr. Katrinka Blazing, normal 2006. Notes severe GERD. States unable to eat for past few years. Sometimes eating baby food. States has a lot of gas. Sometimes dysphagia to solid foods. "nothing rough". Has to eat soft foods. No nausea or vomiting. Sometimes has to regurgitate food so she doesn't choke. Sometimes will have spasms mid-abdomen after eating. Takes Nexium, "which helps a lot but so expensive". Describes spasms as "burning". Sometimes Mylanta will help. Takes a laxative every day to have a BM. No melena. Scant amount of paper hematochezia after straining. Used to weigh 130, states several weeks ago was 117.   States has tried Zantac, Designer, fashion/clothing. Reportedly on Protonix BID now. States not working.  States issues with hemorrhoids every time she has a bowel movement. Sometimes has to push back inside. States Preparation H doesn't work.   Pt thinks she had EGD last year in [place]. HOWEVER, records reviewed, no EGD. Instead: esophageal manometry performed Sept 2012. Hiatal hernia noted, hypotensive pressure of LES. Normal peristalsis contractions.  24 hour pH study with excellent control of GERD on PPI therapy. DeMeester score 0.2.   Past Medical History  Diagnosis Date  . Memory loss 06/13/2009  . Irritable bowel syndrome 06/13/2009  . MIGRAINE HEADACHE 06/13/2009  . OSTEOARTHRITIS 06/13/2009  . HYPERTENSION 06/13/2009  . GERD Sept 2012    [facility], pH and manometry.. hiatal hernia present, resting pressure of LES hypotensive. normal peristalsis esophageal contractions. DeMeester score 0.2. Excellent control of GERD on PPI.   Marland Kitchen HYPERCHOLESTEROLEMIA 06/13/2009  . DM 06/13/2009    Past Surgical History  Procedure Date  . Appendectomy     40+ years ago  . Esophagogastroduodenoscopy  03/30/2003     Schatzki ring otherwise normal esophagus/  Small hiatal hernia/ Normal stomach/ Status post dilation of the ring in an unusual manner  . Colonoscopy 03/30/2003    Poor prep precluded completion of the examination.  . Colonoscopy June 2006    Dr. Elpidio Anis: normal  . 24 hour ph study Sept 2012    DeMeester 0.2  . Esophageal manometry     hypotensive LES    Current Outpatient Prescriptions  Medication Sig Dispense Refill  . ALPRAZolam (XANAX) 0.5 MG tablet Take 0.5 mg by mouth 3 (three) times daily.      . Ascorbic Acid (VITAMIN C) 250 MG tablet Take 250 mg by mouth daily.        . bromocriptine (PARLODEL) 2.5 MG tablet Take 1 tablet (2.5 mg total) by mouth at bedtime.  30 tablet  11  . Calcium-Magnesium-Zinc (CALCIUM/MAGNESIUM/ZINC FORMULA) 1000-500-50 MG TABS Take 1 tablet by mouth 2 (two) times daily.        . Cholecalciferol (VITAMIN D) 2000 UNITS CAPS Take 1 capsule by mouth daily.        Marland Kitchen losartan (COZAAR) 50 MG tablet Take 50 mg by mouth daily.        . metFORMIN (GLUCOPHAGE) 500 MG tablet TAKE 2 TABLETS BY MOUTH TWO TIMES A DAY  120 tablet  7  . Multiple Vitamins-Minerals (CENTRUM SILVER PO) Take 1 tablet by mouth daily.      . pantoprazole (PROTONIX) 40 MG tablet Take 40 mg by mouth 2 (two) times daily.      . simvastatin (ZOCOR) 40  MG tablet Take 40 mg by mouth at bedtime.        . sitaGLIPtin (JANUVIA) 100 MG tablet Take 1 tablet (100 mg total) by mouth daily.  90 tablet  2  . verapamil (VERELAN PM) 120 MG 24 hr capsule Take 120 mg by mouth at bedtime.        Marland Kitchen esomeprazole (NEXIUM) 40 MG capsule Take 1 capsule (40 mg total) by mouth 2 times daily at 12 noon and 4 pm.  60 capsule  3  . hydrocortisone (ANUSOL-HC) 25 MG suppository Place 1 suppository (25 mg total) rectally every 12 (twelve) hours. For 7 days.  14 suppository  1  . polyethylene glycol powder (GLYCOLAX/MIRALAX) powder Take 17 g by mouth daily. For constipation. Hold if loose stools.  255 g  3     Allergies as of 01/01/2012  . (No Known Allergies)    Family History  Problem Relation Age of Onset  . Diabetes Father   . Diabetes Sister   . Colon cancer Brother     deceased at age  30, diagnosed age late 2s.     History   Social History  . Marital Status: Divorced    Spouse Name: N/A    Number of Children: N/A  . Years of Education: N/A   Occupational History  . [employer]     Retired   Social History Main Topics  . Smoking status: Never Smoker   . Smokeless tobacco: Not on file  . Alcohol Use: No  . Drug Use: No  . Sexually Active: Not on file   Other Topics Concern  . Not on file   Social History Narrative  . No narrative on file    Review of Systems: Gen: SEE HPI CV: Denies chest pain, heart palpitations, peripheral edema, syncope.  Resp: Denies shortness of breath at rest or with exertion. Denies wheezing or cough.  GI: SEE HPI GU : Denies urinary burning, urinary frequency, urinary hesitancy MS:+arthritis  Derm: Denies rash, itching, dry skin Psych: Denies depression, anxiety. +short-term memory loss, "can't think like I used to" Heme: Denies bruising, bleeding, and enlarged lymph nodes.  Physical Exam: BP 122/69  Pulse 88  Temp 97.5 F (36.4 C) (Temporal)  Ht 5\' 6"  (1.676 m)  Wt 122 lb 12.8 oz (55.702 kg)  BMI 19.82 kg/m2 General:   Alert and oriented. Pleasant and cooperative. Well-nourished and well-developed.  Head:  Normocephalic and atraumatic. Eyes:  Without icterus, sclera clear and conjunctiva pink.  Ears:  Normal auditory acuity. Nose:  No deformity, discharge,  or lesions. Mouth:  No deformity or lesions, oral mucosa pink.  Neck:  Supple, without mass or thyromegaly. Lungs:  Clear to auscultation bilaterally. No wheezes, rales, or rhonchi. No distress.  Heart:  S1, S2 present without murmurs appreciated.  Abdomen:  +BS, soft, non-tender and non-distended. No HSM noted. No guarding or rebound. LLQ palpable small quarter-sized  "mass" noted. ?stool burden  Rectal:  External hemorrhoid, non-thrombosed. Msk:  Symmetrical without gross deformities. Normal posture. Extremities:  Without clubbing or edema. Neurologic:  Alert and  oriented x4;  grossly normal neurologically. Skin:  Intact without significant lesions or rashes. Cervical Nodes:  No significant cervical adenopathy. Psych:  Alert and cooperative. Normal mood and affect.

## 2012-01-02 ENCOUNTER — Telehealth: Payer: Self-pay | Admitting: Gastroenterology

## 2012-01-02 ENCOUNTER — Encounter: Payer: Self-pay | Admitting: Gastroenterology

## 2012-01-02 DIAGNOSIS — R634 Abnormal weight loss: Secondary | ICD-10-CM | POA: Insufficient documentation

## 2012-01-02 DIAGNOSIS — K921 Melena: Secondary | ICD-10-CM | POA: Insufficient documentation

## 2012-01-02 DIAGNOSIS — K5900 Constipation, unspecified: Secondary | ICD-10-CM | POA: Insufficient documentation

## 2012-01-02 DIAGNOSIS — R109 Unspecified abdominal pain: Secondary | ICD-10-CM | POA: Insufficient documentation

## 2012-01-02 DIAGNOSIS — R1310 Dysphagia, unspecified: Secondary | ICD-10-CM | POA: Insufficient documentation

## 2012-01-02 NOTE — Assessment & Plan Note (Signed)
Chronic. Miralax daily prn. High fiber diet. +FH of colon cancer in first-degree relative (brother).   Proceed with TCS with Dr. Jena Gauss in near future: the risks, benefits, and alternatives have been discussed with the patient in detail. The patient states understanding and desires to proceed.

## 2012-01-02 NOTE — Assessment & Plan Note (Signed)
In setting of chronic constipation. Anusol for hemorrhoids. Miralax daily for BM. High fiber diet. Brother with HX OF COLON CANCER. Needs updated colonoscopy at time of EGD.

## 2012-01-02 NOTE — Telephone Encounter (Signed)
Please let patient know I have reviewed her records.   I'd like to have her scheduled for an upper endoscopy with dilation and colonoscopy.   1/2 dose of diabetes medication (glucophage) the evening prior, none the day of.   Make sure procedures are set up for after the CT scan not before. Thanks!  Tobi Bastos

## 2012-01-02 NOTE — Assessment & Plan Note (Signed)
Upper abdominal pain after eating, described as burning and relieved with Mylanta. Last EGD in 2005. Switch to Nexium. Concern for gastritis, ulcer, IBS, less likely malignancy. EGD in near future. Unable to exclude occult biliary disease. Not mentioned above, LFTs normal Sept 2013 outside facility.

## 2012-01-02 NOTE — Assessment & Plan Note (Signed)
Dilation in near future, see GERD.

## 2012-01-02 NOTE — Assessment & Plan Note (Signed)
78 year old female with hx of chronic GERD, worsening. Notes decreased po intake, inability to tolerate solid foods due to dysphagia. Denies N/V. +regurgitation to avoid choking. Nexium has worked best in past. Previously failed Zantac, Prilosec, Protonix BID. Last EGD in 2005 with Schatzki's ring. 24 hour pH and esophageal manometry performed at [facility] Sept 2012 with overall impression of adequate GERD control with PPI, hypotensive LES. Wt loss at this point multifactorial due to decreased po intake, unable to exclude occult malignancy although less likely.   Resume Nexium. Rx sent to pharmacy.  Proceed with upper endoscopy/dilation in the near future with Dr. Jena Gauss. The risks, benefits, and alternatives have been discussed in detail with patient. They have stated understanding and desire to proceed.

## 2012-01-02 NOTE — Progress Notes (Signed)
Faxed to PCP

## 2012-01-02 NOTE — Assessment & Plan Note (Signed)
CT abd/pelvis due to questionable palpable mass on physical exam, LLQ. Decreased po intake contributor.

## 2012-01-03 ENCOUNTER — Other Ambulatory Visit: Payer: Self-pay | Admitting: Endocrinology

## 2012-01-03 ENCOUNTER — Other Ambulatory Visit: Payer: Self-pay | Admitting: Internal Medicine

## 2012-01-03 DIAGNOSIS — K921 Melena: Secondary | ICD-10-CM

## 2012-01-03 DIAGNOSIS — R109 Unspecified abdominal pain: Secondary | ICD-10-CM

## 2012-01-03 DIAGNOSIS — R1310 Dysphagia, unspecified: Secondary | ICD-10-CM

## 2012-01-03 DIAGNOSIS — R634 Abnormal weight loss: Secondary | ICD-10-CM

## 2012-01-03 DIAGNOSIS — K5900 Constipation, unspecified: Secondary | ICD-10-CM

## 2012-01-03 DIAGNOSIS — K219 Gastro-esophageal reflux disease without esophagitis: Secondary | ICD-10-CM

## 2012-01-03 MED ORDER — PEG 3350-KCL-NA BICARB-NACL 420 G PO SOLR: 4000.0000 mL | ORAL | Status: DC

## 2012-01-03 NOTE — Telephone Encounter (Signed)
Pt would like rx refilled for diabetes meds. Send to [facility] in [place].

## 2012-01-03 NOTE — Telephone Encounter (Signed)
Patient is scheduled with RMR on Nov 7th I have spoke to the patient and she wants to discuss this with her daughter further before really committing to do the procedure I have mailed her the instructions anyway.

## 2012-01-04 MED ORDER — LOSARTAN POTASSIUM 50 MG PO TABS: ORAL_TABLET | ORAL | Status: DC

## 2012-01-07 ENCOUNTER — Telehealth: Payer: Self-pay | Admitting: *Deleted

## 2012-01-07 ENCOUNTER — Other Ambulatory Visit: Payer: Self-pay | Admitting: Gastroenterology

## 2012-01-07 ENCOUNTER — Ambulatory Visit (HOSPITAL_COMMUNITY)
Admission: RE | Admit: 2012-01-07 | Discharge: 2012-01-07 | Disposition: A | Discharge status: Home / Self Care | Payer: Medicare Other | Source: Ambulatory Visit | Attending: Gastroenterology | Admitting: Gastroenterology

## 2012-01-07 DIAGNOSIS — R1904 Left lower quadrant abdominal swelling, mass and lump: Secondary | ICD-10-CM | POA: Insufficient documentation

## 2012-01-07 DIAGNOSIS — E119 Type 2 diabetes mellitus without complications: Secondary | ICD-10-CM | POA: Insufficient documentation

## 2012-01-07 DIAGNOSIS — R634 Abnormal weight loss: Secondary | ICD-10-CM | POA: Insufficient documentation

## 2012-01-07 DIAGNOSIS — K769 Liver disease, unspecified: Secondary | ICD-10-CM | POA: Insufficient documentation

## 2012-01-07 DIAGNOSIS — R911 Solitary pulmonary nodule: Secondary | ICD-10-CM | POA: Insufficient documentation

## 2012-01-07 DIAGNOSIS — E875 Hyperkalemia: Secondary | ICD-10-CM

## 2012-01-07 LAB — POCT I-STAT, CHEM 8
BUN: 21 mg/dL (ref 6–23)
Calcium, Ion: 1.05 mmol/L — ABNORMAL LOW (ref 1.13–1.30)
Chloride: 101 mEq/L (ref 96–112)

## 2012-01-07 MED ORDER — IOHEXOL 300 MG/ML  SOLN
100.0000 mL | Freq: Once | INTRAMUSCULAR | Status: AC | PRN
  Administered 2012-01-07: 100 mL via INTRAVENOUS

## 2012-01-07 NOTE — Progress Notes (Signed)
Quick Note:  I did not order this lab; this was done prior to CT. Creatinine order was on file, but for some reason this was not obtained. Incidental finding of hyperkalemia noted. Pt is completely asymptomatic. May be lab error. However, I have contacted Dr. Mirna Mires, pt's PCP and reviewed with him. I have asked pt to hold Losartan until further labs are drawn. She was instructed to have BMP redrawn tomorrow; we will fax results to Dr. Loleta Chance for further review and management. ?ARB effect vs lab error.    ______

## 2012-01-07 NOTE — Telephone Encounter (Signed)
Ms Dipasquale's daughter called today. She would like the results of her mothers ct scan called to her. Thanks.

## 2012-01-07 NOTE — Telephone Encounter (Signed)
Routing to AS 

## 2012-01-07 NOTE — Telephone Encounter (Signed)
Attempted to reach daughter. Had to leave message.  See lab documentation.

## 2012-01-08 ENCOUNTER — Other Ambulatory Visit: Payer: Self-pay | Admitting: Gastroenterology

## 2012-01-08 ENCOUNTER — Telehealth: Payer: Self-pay | Admitting: Gastroenterology

## 2012-01-08 LAB — BASIC METABOLIC PANEL
CO2: 29 mEq/L (ref 19–32)
Calcium: 9.4 mg/dL (ref 8.4–10.5)
Chloride: 101 mEq/L (ref 96–112)
Glucose, Bld: 115 mg/dL — ABNORMAL HIGH (ref 70–99)
Potassium: 4.4 mEq/L (ref 3.5–5.3)
Sodium: 137 mEq/L (ref 135–145)

## 2012-01-08 NOTE — Telephone Encounter (Signed)
Pt states she had blood work done today.  Awaiting results.

## 2012-01-09 ENCOUNTER — Telehealth: Payer: Self-pay | Admitting: Internal Medicine

## 2012-01-09 NOTE — Telephone Encounter (Signed)
Informed pt of lab results  

## 2012-01-09 NOTE — Progress Notes (Signed)
Quick Note:  Please let pt know her potassium has normalized. I'm tending to think the first value was not correct.   Please fax to her PCP, Dr. Mirna Mires, so he may have this on file. Include the result notes from first lab draw and this one. Thanks! ______

## 2012-01-09 NOTE — Telephone Encounter (Signed)
Pt called to speak with AS and wants her lab results. Please call her at [phone]

## 2012-01-09 NOTE — Telephone Encounter (Signed)
Results obtained. Normal.

## 2012-01-09 NOTE — Progress Notes (Signed)
Faxed to PCP

## 2012-01-10 ENCOUNTER — Other Ambulatory Visit: Payer: Self-pay | Admitting: General Practice

## 2012-01-10 MED ORDER — SITAGLIPTIN PHOSPHATE 100 MG PO TABS: 100.0000 mg | ORAL_TABLET | Freq: Every day | ORAL | Status: DC

## 2012-01-10 NOTE — Telephone Encounter (Signed)
Sorry, we have none

## 2012-01-10 NOTE — Telephone Encounter (Signed)
Pt daughter notified   

## 2012-01-10 NOTE — Telephone Encounter (Signed)
Pt daughter called to see if we have any samples of Januvia that her mother could have. Please call daughter back to advise.

## 2012-01-16 ENCOUNTER — Encounter (HOSPITAL_COMMUNITY): Payer: Self-pay | Admitting: Pharmacy Technician

## 2012-01-17 ENCOUNTER — Other Ambulatory Visit: Payer: Self-pay | Admitting: Endocrinology

## 2012-01-17 ENCOUNTER — Encounter: Payer: Self-pay | Admitting: Gastroenterology

## 2012-01-17 NOTE — Progress Notes (Signed)
Contacted patient to make sure she knew the CT results. Due to labs with hyperkalemia, I wanted to make sure she understood the CT results as well.  She has no question. See result notes under CT.   [name], patient wants to cancel EGD and TCS with RMR. She is feeling better. I reviewed with her the importance of colon cancer screening, specifically with a +FH of colon cancer. She still would like to wait. Can we cancel the procedures and have her return in mid-December for a weight check and OV with me? Thanks!  ~~~~~~~~~~~~~~~~~~~~~  [name]: Can we make sure pt returns for weight check sometime this month? Thanks!

## 2012-01-17 NOTE — Progress Notes (Signed)
Procedure is cancelled

## 2012-01-24 ENCOUNTER — Ambulatory Visit: Admit: 2012-01-24 | Payer: Self-pay | Admitting: Internal Medicine

## 2012-01-24 SURGERY — COLONOSCOPY, ESOPHAGOGASTRODUODENOSCOPY (EGD) AND ESOPHAGEAL DILATION (ED)
Anesthesia: Moderate Sedation

## 2012-02-13 ENCOUNTER — Encounter: Payer: Self-pay | Admitting: Internal Medicine

## 2012-02-13 ENCOUNTER — Ambulatory Visit (INDEPENDENT_AMBULATORY_CARE_PROVIDER_SITE_OTHER): Payer: Medicare Other | Admitting: Internal Medicine

## 2012-02-13 VITALS — BP 136/72 | HR 79 | Temp 97.5°F | Resp 16 | Wt 119.0 lb

## 2012-02-13 DIAGNOSIS — E119 Type 2 diabetes mellitus without complications: Secondary | ICD-10-CM

## 2012-02-13 DIAGNOSIS — E7800 Pure hypercholesterolemia, unspecified: Secondary | ICD-10-CM

## 2012-02-13 MED ORDER — BROMOCRIPTINE MESYLATE 2.5 MG PO TABS: 2.5000 mg | ORAL_TABLET | Freq: Every day | ORAL | Status: DC

## 2012-02-13 MED ORDER — METFORMIN HCL 500 MG PO TABS: 500.0000 mg | ORAL_TABLET | Freq: Two times a day (BID) | ORAL | Status: AC

## 2012-02-13 MED ORDER — SIMVASTATIN 40 MG PO TABS: 40.0000 mg | ORAL_TABLET | Freq: Every day | ORAL | Status: DC

## 2012-02-13 NOTE — Patient Instructions (Addendum)
Patient instructions for Diabetes mellitus type 2:  DIET AND EXERCISE Diet and exercise is an important part of diabetic treatment.  We recommended aerobic exercise in the form of brisk walking (working between 40-60% of maximal aerobic capacity, similar to brisk walking) for 150 minutes per week (such as 30 minutes five days per week) along with 3 times per week performing 'resistance' training (using various gauge rubber tubes with handles) 5-10 exercises involving the major muscle groups (upper body, lower body and core) performing 10-15 repetitions (or near fatigue) each exercise. Start at half the above goal but build slowly to reach the above goals. If limited by weight, joint pain, or disability, we recommend daily walking in a swimming pool with water up to waist to reduce pressure from joints while allow for adequate exercise.    BLOOD GLUCOSES Monitoring your blood glucoses is important for continued management of your diabetes. Please check your blood glucoses every day day.  HYPOGLYCEMIA (low blood sugar) Hypoglycemia is usually a reaction to not eating, exercising, or taking too much insulin/ other diabetes drugs.  Symptoms include tremors, sweating, hunger, confusion, headache, etc. Treat IMMEDIATELY with 15 grams of Carbs:   4 glucose tablets    cup regular juice/soda   2 tablespoons raisins   4 teaspoons sugar   1 tablespoon honey Recheck blood glucose in 15 mins and repeat above if still symptomatic/blood glucose <100. Please contact our office at [phone] if you have questions about how to next handle your insulin.  RECOMMENDATIONS TO REDUCE YOUR RISK OF DIABETIC COMPLICATIONS: * Take your prescribed MEDICATION(S). * Follow a DIABETIC diet: Complex carbs, fiber rich foods, heart healthy fish twice weekly, (monounsaturated and polyunsaturated) fats * AVOID saturated/trans fats, high fat foods, >2,300 mg salt per day. * EXERCISE at least 5 times a week for 30 minutes or  preferably daily.  * DO NOT SMOKE OR DRINK more than 1 drink a day. * Check your FEET every day. Do not wear tightfitting shoes. Contact us if you develop an ulcer * See your EYE doctor once a year or more if needed * Get a FLU shot once a year * Get a PNEUMONIA vaccine every 5 years and once after age 49 years  GOALS:  * Your Hemoglobin A1c of 7%  * Your Systolic BP should be 130 or lower  * Your Diastolic BP should be 80 or lower  * Your HDL (Good Cholesterol) should be 40 or higher  * Your LDL (Bad Cholesterol) should be 100 or lower  * Your Triglycerides should be 150 or lower  * Your Urine microalbumin (kidney function) of <30 * Your Body Mass Index should be 25 or lower  We will be glad to help you achieve these goals. Our telephone number is: [phone].

## 2012-02-13 NOTE — Progress Notes (Signed)
Subjective:     Patient ID: [name], female   DOB: 06-27-1928, 78 y.o.   MRN: [id number]  HPI  Pt is an 78 y/o AAW, a pt of Dr. George Hugh, who returns for f/u of type 2 DM (dx'ed 2000; no known complications) - last visit with him in 08/2011. Pt. states she feels well in general and has no complaints other than having a mild cold.   She checks her sugars 1x in am - am: ~130 On Januvia, her sugars are better she says. She also takes Bromocriptine, Metformin (takes only 500 mg bid, rather than 1000 mg bid as advised, as she says her sugars drop on the high dose). She cannot eat well, because of acid reflux. She has hypoglycemia if she cannot eat, lowest 47. She has a low ~2x a month. She has hypoglycemia awareness. She does mention that her swallowing and acid reflux is better now, on Nexium, and has actually gained 6 lbs since last visit. Highest sugars 200.  Last HbA1c: Lab Results  Component Value Date   HGBA1C 7.1* 08/31/2011  Last eye exam was last year, she will schedule a new one.  Refuses Flu vaccine today as she has a cold (no fever or cough, though).  Past Medical History  Diagnosis Date  . Memory loss 06/13/2009  . Irritable bowel syndrome 06/13/2009  . MIGRAINE HEADACHE 06/13/2009  . OSTEOARTHRITIS 06/13/2009  . HYPERTENSION 06/13/2009  . GERD Sept 2012    [facility], pH and manometry.. hiatal hernia present, resting pressure of LES hypotensive. normal peristalsis esophageal contractions. DeMeester score 0.2. Excellent control of GERD on PPI.   Marland Kitchen HYPERCHOLESTEROLEMIA 06/13/2009  . DM 06/13/2009    Past Surgical History  Procedure Date  . Appendectomy     40+ years ago  . Esophagogastroduodenoscopy 03/30/2003     Schatzki ring otherwise normal esophagus/  Small hiatal hernia/ Normal stomach/ Status post dilation of the ring in an unusual manner  . Colonoscopy 03/30/2003    Poor prep precluded completion of the examination.  . Colonoscopy June 2006    Dr.  Elpidio Anis: normal  . 24 hour ph study Sept 2012    DeMeester 0.2  . Esophageal manometry     hypotensive LES   History   Social History  . Marital Status: Divorced    Spouse Name: N/A    Number of Children: N/A  . Years of Education: N/A   Occupational History  . [employer]     Retired   Social History Main Topics  . Smoking status: Never Smoker   . Smokeless tobacco: Not on file  . Alcohol Use: No  . Drug Use: No  . Sexually Active: Not on file   Social History Narrative  . No narrative on file    Current Outpatient Prescriptions on File Prior to Visit  Medication Sig Dispense Refill  . ALPRAZolam (XANAX) 0.5 MG tablet Take 0.5 mg by mouth 3 (three) times daily.      . Calcium-Magnesium-Zinc (CALCIUM/MAGNESIUM/ZINC FORMULA) 1000-500-50 MG TABS Take 1 tablet by mouth 2 (two) times daily.        . Cholecalciferol (VITAMIN D) 2000 UNITS CAPS Take 1 capsule by mouth daily.        Marland Kitchen esomeprazole (NEXIUM) 40 MG capsule Take 1 capsule (40 mg total) by mouth 2 times daily at 12 noon and 4 pm.  60 capsule  3  . losartan (COZAAR) 50 MG tablet TAKE 1 TABLET BY MOUTH EVERY  DAY  30 tablet  0  . Multiple Vitamins-Minerals (CENTRUM SILVER PO) Take 1 tablet by mouth daily.      . sitaGLIPtin (JANUVIA) 100 MG tablet Take 1 tablet (100 mg total) by mouth daily.  30 tablet  6  . verapamil (VERELAN PM) 120 MG 24 hr capsule Take 120 mg by mouth at bedtime.        . [DISCONTINUED] bromocriptine (PARLODEL) 2.5 MG tablet Take 1 tablet (2.5 mg total) by mouth at bedtime.  30 tablet  11  . [DISCONTINUED] metFORMIN (GLUCOPHAGE) 500 MG tablet TAKE 2 TABLETS BY MOUTH TWO TIMES A DAY  120 tablet  7  . [DISCONTINUED] simvastatin (ZOCOR) 40 MG tablet TAKE 1 TABLET BY MOUTH EVERY NIGHT AT BEDTIME  30 tablet  0  . Ascorbic Acid (VITAMIN C) 250 MG tablet Take 250 mg by mouth daily.        . hydrocortisone (ANUSOL-HC) 25 MG suppository Place 1 suppository (25 mg total) rectally every 12 (twelve) hours. For 7  days.  14 suppository  1  . pantoprazole (PROTONIX) 40 MG tablet Take 40 mg by mouth 2 (two) times daily.      . polyethylene glycol powder (GLYCOLAX/MIRALAX) powder Take 17 g by mouth daily. For constipation. Hold if loose stools.  255 g  3  . polyethylene glycol-electrolytes (TRILYTE) 420 G solution Take 4,000 mLs by mouth as directed.  4000 mL  0   No Known Allergies  Family History  Problem Relation Age of Onset  . Diabetes Father   . Diabetes Sister   . Colon cancer Brother     deceased at age  46, diagnosed age late 54s.    Review of Systems Constitutional: weight gain, no fatigue Eyes: no blurry vision ENT: no sore throat, has dysphagia from her GERD Cardiovascular: no CP/SOB/palpitations/leg swelling Respiratory: no cough/SOB Gastrointestinal: no N/V/D/C, has hemorrhoids Musculoskeletal: has joint aches (hands) Skin: no rashes Neurological: no tremors/numbness/tingling/dizziness     Objective:   Physical Exam BP 136/72  Pulse 79  Temp 97.5 F (36.4 C) (Oral)  Resp 16  Wt 119 lb (53.978 kg)  SpO2 97% Weight: 119 lb (53.978 kg)  Wt Readings from Last 3 Encounters:  02/13/12 119 lb (53.978 kg)  01/01/12 122 lb 12.8 oz (55.702 kg)  08/31/11 113 lb (51.256 kg)  Constitutional: thin, in NAD Eyes: PERRLA, EOMI, no exophthalmos ENT: very dry mucous membranes, no thyromegaly Cardiovascular: RRR, No MRG Respiratory: CTA B Gastrointestinal: abdomen soft, NT, ND, BS+ Musculoskeletal: no deformities, strength intact in all 4 Skin: moist, warm, no rashes Neurological: no tremor with outstretched hands, DTR normal in all 4     Assessment:     1. DM2, non-insulin-dependent, w/o complications    Plan:     - pt had episodes on hypoglycemia 2/2 poor po intake 2/2 GERD and dysphagia, now improved >> less hypoglycemia. Weight improved, too. - pt given sugar logs and shown how to fill them out - refilled Metformin, Bromocriptine, Simvastatin - will check Lipids and  HbA1C - given handout Re: hypoglycemia management, diabetes targets (see Pt Instructions) - will RTC in 3 mo to f/u with Dr. Everardo All  Apparently, pt. did not stop for labs.

## 2012-02-15 ENCOUNTER — Other Ambulatory Visit: Payer: Self-pay | Admitting: Endocrinology

## 2012-02-26 ENCOUNTER — Other Ambulatory Visit: Payer: Self-pay | Admitting: *Deleted

## 2012-02-26 ENCOUNTER — Other Ambulatory Visit: Payer: Self-pay | Admitting: Endocrinology

## 2012-02-26 MED ORDER — LOSARTAN POTASSIUM 50 MG PO TABS: 50.0000 mg | ORAL_TABLET | Freq: Every day | ORAL | Status: DC

## 2012-02-26 NOTE — Progress Notes (Signed)
Quick Note:  LATE ENTRY: THESE RESULTS WERE DISCUSSED WITH PATIENT AT TIME OF CT. She states she has been told in the past that she had a "mass" in her liver. Upon review of chart, most recent imaging was April 2006, [place].  [place] April 2006:  Echogenic mass left lobe liver 4.3 cm greatest diameter, likely a hepatic hemangioma. This has been previously evaluated by CT and nuclear medicine blood pool imaging. The report of the prior CT from 2003 indicates 3.8 cm greatest diameter mass at that time, indicating minimal interval enlargement.  Also, CT from Dec 2003 noted, with likely cavernous hemangioma.  IMPRESSION 1. MULTILOBULATED LESION IN THE LATERAL SEGMENT OF THE LEFT HEPATIC LOBE HAS FEATURES MOST COMPATIBLE WITH A CAVERNOUS HEMANGIOMA. 2. THERE ARE NO OTHER LIVER LESIONS. 3. BASILAR PULMONARY NODULES ARE PRESENT AS REPORTED ON THE PRIOR STUDY. THE PATIENT'S PREVIOUS CT EXAMINATIONS OF THE CHEST AND ABDOMEN, OBTAINED AT [facility] ON 08/01/01 AND 07/13/01 RESPECTIVELY, HAVE BEEN OBTAINED FOR COMPARISON. THERE HAS BEEN NO SIGNIFICANT CHANGE IN SIZE OR SHAPE OF THE PROBABLE CAVERNOUS HEMANGIOMA IN THE LATERAL SEGMENT OF THE LEFT LOBE OF THE LIVER. THE SMALL NODULES AT THE LUNG BASES ARE STABLE.    Therefore, these are not new findings. Will need to nic CT chest for 1 year. Pt denied further work-up with MRI at the time of this discussion. ______

## 2012-03-17 ENCOUNTER — Encounter: Payer: Self-pay | Admitting: Gastroenterology

## 2012-03-17 NOTE — Progress Notes (Signed)
Pt is aware of OV on 1/15 at 0930 with AS and appt card was mailed

## 2012-04-01 ENCOUNTER — Other Ambulatory Visit: Payer: Self-pay | Admitting: Family Medicine

## 2012-04-01 ENCOUNTER — Encounter: Payer: Self-pay | Admitting: Internal Medicine

## 2012-04-01 DIAGNOSIS — R413 Other amnesia: Secondary | ICD-10-CM

## 2012-04-02 ENCOUNTER — Ambulatory Visit (INDEPENDENT_AMBULATORY_CARE_PROVIDER_SITE_OTHER): Payer: Medicare Other | Admitting: Gastroenterology

## 2012-04-02 ENCOUNTER — Encounter: Payer: Self-pay | Admitting: Gastroenterology

## 2012-04-02 VITALS — BP 113/66 | HR 83 | Temp 98.0°F | Ht 63.0 in | Wt 120.8 lb

## 2012-04-02 DIAGNOSIS — K5900 Constipation, unspecified: Secondary | ICD-10-CM

## 2012-04-02 DIAGNOSIS — R634 Abnormal weight loss: Secondary | ICD-10-CM

## 2012-04-02 DIAGNOSIS — R1310 Dysphagia, unspecified: Secondary | ICD-10-CM

## 2012-04-02 DIAGNOSIS — K219 Gastro-esophageal reflux disease without esophagitis: Secondary | ICD-10-CM

## 2012-04-02 MED ORDER — LUBIPROSTONE 8 MCG PO CAPS: 8.0000 ug | ORAL_CAPSULE | Freq: Two times a day (BID) | ORAL | Status: DC

## 2012-04-02 NOTE — Progress Notes (Signed)
Referring Provider: Mirna Mires, MD Primary Care Physician:  Evlyn Courier, MD Primary Gastroenterologist: Dr. Jena Gauss   Chief Complaint  Patient presents with  . Follow-up    HPI:   79 year old female with hx of GERD, dysphagia, hematochezia, chronic constipation, wt loss, +FH of colon cancer in brother. Last EGD in 2005 with Schatzki's ring. Manometry and pH performed at [facility] in Sept 2012 with adequate GERD control with PPI, hypotensive LES. At last visit in October, I had recommended and EGD/ED/TCS due to symptoms. CT was also completed due to ?mass palpated during abdominal exam. See findings below. She cancelled the procedures, stating she was feeling better.   Presents today down 2 lbs since last visit. States dysphagia has resolved. Nexium started at last visit, BID, with significant improvement. Out of the donut hole now, so paying about 40$ for 60 pills. Denies abdominal pain. +discomfort if constipated. Tries to have a BM every day. Eats small portions. No rectal bleeding.   Pt does not want to pursue any procedures. Refusing MRI.   CT showed distended colon with stool, 2.8 cm hypovascular mass in left hepatic lobe, lung nodules. The liver findings were not new, based on review of prior records. However, I had recommended an MRI for further characterization. She declined this.  Past Medical History  Diagnosis Date  . Memory loss 06/13/2009  . Irritable bowel syndrome 06/13/2009  . MIGRAINE HEADACHE 06/13/2009  . OSTEOARTHRITIS 06/13/2009  . HYPERTENSION 06/13/2009  . GERD Sept 2012    [facility], pH and manometry.. hiatal hernia present, resting pressure of LES hypotensive. normal peristalsis esophageal contractions. DeMeester score 0.2. Excellent control of GERD on PPI.   Marland Kitchen HYPERCHOLESTEROLEMIA 06/13/2009  . DM 06/13/2009    Past Surgical History  Procedure Date  . Appendectomy     40+ years ago  . Esophagogastroduodenoscopy 03/30/2003     PIR:JJOACZYS ring  otherwise normal esophagus/  Small hiatal hernia/ Normal stomach/ Status post dilation of the ring in an unusual manner  . Colonoscopy 03/30/2003    Poor prep precluded completion of the examination.  . Colonoscopy June 2006    Dr. Elpidio Anis: normal  . 24 hour ph study Sept 2012    DeMeester 0.2  . Esophageal manometry     hypotensive LES    Current Outpatient Prescriptions  Medication Sig Dispense Refill  . ALPRAZolam (XANAX) 0.5 MG tablet Take 0.5 mg by mouth 3 (three) times daily.      . Ascorbic Acid (VITAMIN C) 250 MG tablet Take 250 mg by mouth daily.        . bromocriptine (PARLODEL) 2.5 MG tablet Take 1 tablet (2.5 mg total) by mouth at bedtime.  30 tablet  11  . Calcium-Magnesium-Zinc (CALCIUM/MAGNESIUM/ZINC FORMULA) 1000-500-50 MG TABS Take 1 tablet by mouth 2 (two) times daily.        . Cholecalciferol (VITAMIN D) 2000 UNITS CAPS Take 1 capsule by mouth daily.        Marland Kitchen esomeprazole (NEXIUM) 40 MG capsule Take 1 capsule (40 mg total) by mouth 2 times daily at 12 noon and 4 pm.  60 capsule  3  . hydrocortisone (ANUSOL-HC) 25 MG suppository Place 1 suppository (25 mg total) rectally every 12 (twelve) hours. For 7 days.  14 suppository  1  . imipramine (TOFRANIL) 10 MG tablet Take 10 mg by mouth 4 (four) times daily.      Marland Kitchen losartan (COZAAR) 50 MG tablet Take 1 tablet (50 mg total) by mouth  daily. TAKE 1 TABLET BY MOUTH EVERY DAY . Be sure patient keeps follow up appt. In 3 mnths  30 tablet  3  . metFORMIN (GLUCOPHAGE) 500 MG tablet Take 1 tablet (500 mg total) by mouth 2 (two) times daily with a meal.  60 tablet  11  . pantoprazole (PROTONIX) 40 MG tablet Take 40 mg by mouth 2 (two) times daily.      . polyethylene glycol powder (GLYCOLAX/MIRALAX) powder Take 17 g by mouth daily. For constipation. Hold if loose stools.  255 g  3  . simvastatin (ZOCOR) 40 MG tablet Take 1 tablet (40 mg total) by mouth at bedtime.  30 tablet  11  . sitaGLIPtin (JANUVIA) 100 MG tablet Take 1 tablet  (100 mg total) by mouth daily.  30 tablet  6  . verapamil (VERELAN PM) 120 MG 24 hr capsule Take 120 mg by mouth at bedtime.        . polyethylene glycol-electrolytes (TRILYTE) 420 G solution Take 4,000 mLs by mouth as directed.  4000 mL  0    Allergies as of 04/02/2012  . (No Known Allergies)    Family History  Problem Relation Age of Onset  . Diabetes Father   . Diabetes Sister   . Colon cancer Brother     deceased at age  9, diagnosed age late 35s.     History   Social History  . Marital Status: Divorced    Spouse Name: N/A    Number of Children: N/A  . Years of Education: N/A   Occupational History  . [employer]     Retired   Social History Main Topics  . Smoking status: Never Smoker   . Smokeless tobacco: None  . Alcohol Use: No  . Drug Use: No  . Sexually Active: None   Other Topics Concern  . None   Social History Narrative  . None    Review of Systems: Negative unless otherwise mentioned in HPI  Physical Exam: BP 113/66  Pulse 83  Temp 98 F (36.7 C) (Oral)  Ht 5\' 3"  (1.6 m)  Wt 120 lb 12.8 oz (54.795 kg)  BMI 21.40 kg/m2 General:   Alert and oriented. No distress noted. Pleasant and cooperative.  Head:  Normocephalic and atraumatic. Eyes:  Conjuctiva clear without scleral icterus. Abdomen:  +BS, soft, non-tender and non-distended. No rebound or guarding. No HSM. LLQ with likely palpable stool (similar to last visit, with f/u CT ordered negative for mass: +STOOL) Rectal: small non-thrombosed external hemorrhoid Msk:  Symmetrical without gross deformities. Normal posture. Extremities:  Without edema. Neurologic:  Alert and  oriented x4;  grossly normal neurologically. Skin:  Intact without significant lesions or rashes. Psych:  Alert and cooperative. Normal mood and affect.

## 2012-04-02 NOTE — Progress Notes (Signed)
Faxed to PCP

## 2012-04-02 NOTE — Assessment & Plan Note (Addendum)
Chronic, last TCS in 2006 by Dr. Katrinka Blazing: normal. HOWEVER, pt has +FH of colon cancer (brother). Low-volume hematochezia resolved since last visit. PT REFUSING COLONOSCOPY AT THIS POINT. Some improvement with Miralax, non-thrombosed small external hemorrhoid on external rectal exam. I reviewed the benefits of lower GI evaluation in detail. She continues to refuse. We will try Amitiza 8 mcg po BID, samples provided. Pt to call if improvement and full rx will be sent. 6 months follow-up.

## 2012-04-02 NOTE — Assessment & Plan Note (Addendum)
Notes at one point weighing as low as 117. Last visit 122, today 120.8. Likely multifactorial, with decreased po intake as culprit. Questionable mass in liver, which has been chronically present, dating back to 2003. Likely hemangioma, but MRI was recommended for further evaluation. Pt continues to refuse further work-up. Doubt dealing with malignancy, but we will continue to offer her an MRI; however, she is refusing at this point.   I would like to see her back in 6 months. She is to call if any further weight loss. Hopefully, as reflux is much improved, she will continue to remain at a stable weight. CT IS ON FILE.

## 2012-04-02 NOTE — Assessment & Plan Note (Signed)
RESOLVED. Hx of Schatzki's ring. EGD/ED was set up at last appt but pt refusing now. Restarting Nexium provided significant relief. Tried/failed Prilosec and Protonix. Continue Nexium BID.

## 2012-04-02 NOTE — Patient Instructions (Addendum)
Continue to take Nexium twice a day.   I have provided samples of a constipation medication called Amitiza. Take this WITH FOOD twice a day. Watch for diarrhea. This can cause nausea, so make sure you TAKE IT WITH FOOD.   We will see you back in 6 months. PLEASE CALL us IF YOU CONTINUE TO LOSE WEIGHT.

## 2012-04-02 NOTE — Assessment & Plan Note (Signed)
Continue nexium BID. 

## 2012-04-03 ENCOUNTER — Other Ambulatory Visit: Payer: Medicare Other

## 2012-04-03 ENCOUNTER — Ambulatory Visit
Admission: RE | Admit: 2012-04-03 | Discharge: 2012-04-03 | Disposition: A | Discharge status: Home / Self Care | Payer: Medicare Other | Source: Ambulatory Visit | Attending: Family Medicine | Admitting: Family Medicine

## 2012-04-03 DIAGNOSIS — R413 Other amnesia: Secondary | ICD-10-CM

## 2012-05-14 ENCOUNTER — Telehealth: Payer: Self-pay

## 2012-05-14 NOTE — Telephone Encounter (Signed)
Pt came by office- she is wanting an ov with AS tomorrow. Spoke with pt- she is having a lot of gas and some pains that shoot thru her R side on occasion. No other pain, no N/V, no blood , no fever, no diarrhea. Got pts weight since she was here and it is 120.2 lbs. Pt wants to know if she can be seen or if there is anything we can do until she can be seen. Next available ov is 06/03/12 with AS.  Please advise.

## 2012-05-14 NOTE — Telephone Encounter (Signed)
I would like to offer her the MRI of liver that was recommended at last visit. She could then follow-up with [place].

## 2012-05-15 ENCOUNTER — Ambulatory Visit: Payer: Medicare Other | Admitting: Endocrinology

## 2012-05-15 NOTE — Telephone Encounter (Signed)
Pt aware. She is going to talk with her daughter and will call back on Monday and let us know what she wants to do.

## 2012-05-19 ENCOUNTER — Other Ambulatory Visit: Payer: Self-pay

## 2012-05-19 MED ORDER — ESOMEPRAZOLE MAGNESIUM 40 MG PO CPDR: 40.0000 mg | DELAYED_RELEASE_CAPSULE | Freq: Two times a day (BID) | ORAL | Status: DC

## 2012-05-21 ENCOUNTER — Ambulatory Visit (INDEPENDENT_AMBULATORY_CARE_PROVIDER_SITE_OTHER): Payer: Medicare Other | Admitting: Endocrinology

## 2012-05-21 ENCOUNTER — Encounter: Payer: Self-pay | Admitting: Endocrinology

## 2012-05-21 ENCOUNTER — Other Ambulatory Visit: Payer: Self-pay | Admitting: Endocrinology

## 2012-05-21 VITALS — BP 126/70 | HR 90 | Wt 121.0 lb

## 2012-05-21 DIAGNOSIS — E119 Type 2 diabetes mellitus without complications: Secondary | ICD-10-CM

## 2012-05-21 LAB — HEMOGLOBIN A1C: Hgb A1c MFr Bld: 7.7 % — ABNORMAL HIGH (ref 4.6–6.5)

## 2012-05-21 LAB — MICROALBUMIN / CREATININE URINE RATIO: Microalb, Ur: 0.8 mg/dL (ref 0.0–1.9)

## 2012-05-21 MED ORDER — PIOGLITAZONE HCL 45 MG PO TABS: 45.0000 mg | ORAL_TABLET | Freq: Every day | ORAL | Status: DC

## 2012-05-21 NOTE — Progress Notes (Signed)
Subjective:    Patient ID: [name], female    DOB: 1928/04/27, 79 y.o.   MRN: [id number]  HPI Pt returns for f/u of type 2 DM (dx'ed 2000; no known complications; she takes 3 oral agents).  pt states she feels well in general.  no cbg record, but states cbg's are well-controlled.   Past Medical History  Diagnosis Date  . Memory loss 06/13/2009  . Irritable bowel syndrome 06/13/2009  . MIGRAINE HEADACHE 06/13/2009  . OSTEOARTHRITIS 06/13/2009  . HYPERTENSION 06/13/2009  . GERD Sept 2012    [facility], pH and manometry.. hiatal hernia present, resting pressure of LES hypotensive. normal peristalsis esophageal contractions. DeMeester score 0.2. Excellent control of GERD on PPI.   Marland Kitchen HYPERCHOLESTEROLEMIA 06/13/2009  . DM 06/13/2009    Past Surgical History  Procedure Laterality Date  . Appendectomy      40+ years ago  . Esophagogastroduodenoscopy  03/30/2003     WUJ:[id number] ring otherwise normal esophagus/  Small hiatal hernia/ Normal stomach/ Status post dilation of the ring in an unusual manner  . Colonoscopy  03/30/2003    Poor prep precluded completion of the examination.  . Colonoscopy  June 2006    Dr. Elpidio Anis: normal  . 24 hour ph study  Sept 2012    DeMeester 0.2  . Esophageal manometry      hypotensive LES    History   Social History  . Marital Status: Divorced    Spouse Name: N/A    Number of Children: N/A  . Years of Education: N/A   Occupational History  . [employer]     Retired   Social History Main Topics  . Smoking status: Never Smoker   . Smokeless tobacco: Not on file  . Alcohol Use: No  . Drug Use: No  . Sexually Active: Not on file   Other Topics Concern  . Not on file   Social History Narrative  . No narrative on file    Current Outpatient Prescriptions on File Prior to Visit  Medication Sig Dispense Refill  . ALPRAZolam (XANAX) 0.5 MG tablet Take 0.5 mg by mouth 3 (three) times daily.      . Ascorbic Acid (VITAMIN C) 250 MG  tablet Take 250 mg by mouth daily.        . bromocriptine (PARLODEL) 2.5 MG tablet Take 1 tablet (2.5 mg total) by mouth at bedtime.  30 tablet  11  . Calcium-Magnesium-Zinc (CALCIUM/MAGNESIUM/ZINC FORMULA) 1000-500-50 MG TABS Take 1 tablet by mouth 2 (two) times daily.        . Cholecalciferol (VITAMIN D) 2000 UNITS CAPS Take 1 capsule by mouth daily.        Marland Kitchen esomeprazole (NEXIUM) 40 MG capsule Take 1 capsule (40 mg total) by mouth 2 times daily at 12 noon and 4 pm.  60 capsule  5  . hydrocortisone (ANUSOL-HC) 25 MG suppository Place 1 suppository (25 mg total) rectally every 12 (twelve) hours. For 7 days.  14 suppository  1  . imipramine (TOFRANIL) 10 MG tablet Take 10 mg by mouth 4 (four) times daily.      Marland Kitchen losartan (COZAAR) 50 MG tablet Take 1 tablet (50 mg total) by mouth daily. TAKE 1 TABLET BY MOUTH EVERY DAY . Be sure patient keeps follow up appt. In 3 mnths  30 tablet  3  . lubiprostone (AMITIZA) 8 MCG capsule Take 1 capsule (8 mcg total) by mouth 2 (two) times daily with a meal.  14 capsule  0  . metFORMIN (GLUCOPHAGE) 500 MG tablet Take 1 tablet (500 mg total) by mouth 2 (two) times daily with a meal.  60 tablet  11  . pantoprazole (PROTONIX) 40 MG tablet Take 40 mg by mouth 2 (two) times daily.      . polyethylene glycol powder (GLYCOLAX/MIRALAX) powder Take 17 g by mouth daily. For constipation. Hold if loose stools.  255 g  3  . polyethylene glycol-electrolytes (TRILYTE) 420 G solution Take 4,000 mLs by mouth as directed.  4000 mL  0  . simvastatin (ZOCOR) 40 MG tablet Take 1 tablet (40 mg total) by mouth at bedtime.  30 tablet  11  . sitaGLIPtin (JANUVIA) 100 MG tablet Take 1 tablet (100 mg total) by mouth daily.  30 tablet  6  . verapamil (VERELAN PM) 120 MG 24 hr capsule Take 120 mg by mouth at bedtime.         No current facility-administered medications on file prior to visit.   No Known Allergies  Family History  Problem Relation Age of Onset  . Diabetes Father   .  Diabetes Sister   . Colon cancer Brother     deceased at age  14, diagnosed age late 67s.     BP 126/70  Pulse 90  Wt 121 lb (54.885 kg)  BMI 21.44 kg/m2  SpO2 97%  Review of Systems Denies weight change    Objective:   Physical Exam VITAL SIGNS:  See vs page GENERAL: no distress Pulses: dorsalis pedis intact bilat.   Feet: no deformity.  no ulcer on the feet.  feet are of normal color and temp.  no edema Neuro: sensation is intact to touch on the feet.       Assessment & Plan:  DM, apparently well-controlled

## 2012-05-21 NOTE — Patient Instructions (Addendum)
Please make a follow-up appointment in 3 months.   blood and urine tests are being requested for you today.  We'll contact you with results.  check your blood sugar 1 time a day.  vary the time of day when you check, between before the 3 meals, and at bedtime.  also check if you have symptoms of your blood sugar being too high or too low.  please keep a record of the readings and bring it to your next appointment here.  please call us sooner if you are having low blood sugar episodes, or if it stays over 200.  Here are some samples of "tradjenta."  These are similar to [place] (take 1 per day)

## 2012-05-26 ENCOUNTER — Telehealth: Payer: Self-pay | Admitting: Endocrinology

## 2012-05-26 NOTE — Telephone Encounter (Signed)
Daughter called regarding new med has caused sugar levels to drop. Please call daughter, [phone]. Sherri S.

## 2012-05-26 NOTE — Telephone Encounter (Signed)
Please try cutting pioglitizone, and take just 1/2 per day

## 2012-05-27 ENCOUNTER — Other Ambulatory Visit: Payer: Self-pay | Admitting: Gastroenterology

## 2012-05-27 NOTE — Telephone Encounter (Signed)
Pt is aware of OV and to have MRI done prior

## 2012-05-27 NOTE — Telephone Encounter (Signed)
Pt's dtr advised and states an understanding

## 2012-05-28 ENCOUNTER — Telehealth: Payer: Self-pay | Admitting: Endocrinology

## 2012-05-28 NOTE — Telephone Encounter (Signed)
The patient's daughter called to request return call to discuss and get clarification on her diabetic medications.  Please call the patient's daughter at [phone].

## 2012-05-28 NOTE — Telephone Encounter (Signed)
Pt's dtr called confused about meds pt is to be taking, should pt be taking Metformin, Tradjenta samples, and cut the actos in 1/2k is this correct, only these dm rx's? [phone]

## 2012-05-28 NOTE — Telephone Encounter (Signed)
Pt's dtr advised

## 2012-05-28 NOTE — Telephone Encounter (Signed)
Dm meds are [place] (or tradjenta samples), actos (1 pill per day), metformin, and bromocriptine.

## 2012-05-29 ENCOUNTER — Other Ambulatory Visit (HOSPITAL_COMMUNITY): Payer: Medicare Other

## 2012-05-30 ENCOUNTER — Encounter: Payer: Self-pay | Admitting: Internal Medicine

## 2012-06-03 ENCOUNTER — Telehealth: Payer: Self-pay | Admitting: Internal Medicine

## 2012-06-03 ENCOUNTER — Ambulatory Visit: Payer: Medicare Other | Admitting: Gastroenterology

## 2012-06-03 NOTE — Telephone Encounter (Signed)
Pt called to cancel fu OV and her MRI.

## 2012-06-03 NOTE — Telephone Encounter (Signed)
I have cancelled the MRI until further notice

## 2012-06-03 NOTE — Telephone Encounter (Signed)
It is her decision.

## 2012-06-04 ENCOUNTER — Ambulatory Visit (HOSPITAL_COMMUNITY): Payer: Medicare Other

## 2012-06-04 ENCOUNTER — Telehealth: Payer: Self-pay | Admitting: Internal Medicine

## 2012-06-04 NOTE — Telephone Encounter (Signed)
MRI has been R/S for Monday March 24th at 11:00 am

## 2012-06-04 NOTE — Telephone Encounter (Signed)
Pt came by window to [facility] her MRI and to set up OV. She had cancelled both earlier in the week due to the weather. She is aware to have her lab work done and she asked for [place] to call her with the date and time of her MRI. I told her once everything was done that we would set up an OV for her to come back. Pt agreed.

## 2012-06-06 ENCOUNTER — Encounter: Payer: Self-pay | Admitting: Internal Medicine

## 2012-06-06 NOTE — Telephone Encounter (Signed)
Pt is aware of OV on 4/9 at 1030 with AS and appt card was mailed

## 2012-06-09 ENCOUNTER — Encounter (HOSPITAL_COMMUNITY): Payer: Self-pay

## 2012-06-09 ENCOUNTER — Ambulatory Visit (HOSPITAL_COMMUNITY)
Admission: RE | Admit: 2012-06-09 | Discharge: 2012-06-09 | Disposition: A | Discharge status: Home / Self Care | Payer: Medicare Other | Source: Ambulatory Visit | Attending: Gastroenterology | Admitting: Gastroenterology

## 2012-06-09 DIAGNOSIS — R918 Other nonspecific abnormal finding of lung field: Secondary | ICD-10-CM | POA: Insufficient documentation

## 2012-06-09 DIAGNOSIS — R160 Hepatomegaly, not elsewhere classified: Secondary | ICD-10-CM

## 2012-06-09 DIAGNOSIS — K769 Liver disease, unspecified: Secondary | ICD-10-CM | POA: Insufficient documentation

## 2012-06-09 MED ORDER — GADOBENATE DIMEGLUMINE 529 MG/ML IV SOLN
10.0000 mL | Freq: Once | INTRAVENOUS | Status: AC | PRN
  Administered 2012-06-09: 10 mL via INTRAVENOUS

## 2012-06-11 ENCOUNTER — Ambulatory Visit: Payer: Medicare Other | Admitting: Gastroenterology

## 2012-06-12 ENCOUNTER — Ambulatory Visit: Payer: Medicare Other | Admitting: Gastroenterology

## 2012-06-17 NOTE — Progress Notes (Signed)
Quick Note:  Liver lesion likely benign hemangioma. Patient is scheduled to see me April 9th.   ______

## 2012-06-25 ENCOUNTER — Ambulatory Visit (INDEPENDENT_AMBULATORY_CARE_PROVIDER_SITE_OTHER): Payer: Medicare Other | Admitting: Gastroenterology

## 2012-06-25 ENCOUNTER — Encounter: Payer: Self-pay | Admitting: Gastroenterology

## 2012-06-25 VITALS — BP 125/72 | HR 92 | Temp 98.2°F | Ht 66.0 in | Wt 121.0 lb

## 2012-06-25 DIAGNOSIS — R634 Abnormal weight loss: Secondary | ICD-10-CM

## 2012-06-25 DIAGNOSIS — K5900 Constipation, unspecified: Secondary | ICD-10-CM

## 2012-06-25 DIAGNOSIS — K219 Gastro-esophageal reflux disease without esophagitis: Secondary | ICD-10-CM

## 2012-06-25 NOTE — Patient Instructions (Addendum)
We will see you back in 6 months. Please call me if your weight drops or you see any blood.   Continue taking Miralax as needed for constipation.   Continue taking Nexium twice a day.

## 2012-06-25 NOTE — Assessment & Plan Note (Signed)
Stable now for past several months. Upon review of past weights, appears she fluctuates at times and actually admits that stress is a large contributor to her po intake. Continue to offer TCS, but she declines. CT and MRI on file. Return in 6 mos.

## 2012-06-25 NOTE — Assessment & Plan Note (Signed)
Controlled with Nexium BID.

## 2012-06-25 NOTE — Assessment & Plan Note (Signed)
Doing well with Miralax as needed. She is titrating this herself. I have continued to offer her a colonoscopy, as her brother does have a history of colon cancer. Her last colonoscopy was in 2006. She has no longer had any evidence of hematochezia. She continues to decline lower GI evaluation. CT and MRI on file as noted above. At this point, we will continue Miralax and have her return in 6 months to see Dr. Jena Gauss. She is to call if any further changes in the interim.

## 2012-06-25 NOTE — Progress Notes (Signed)
Cc PCP 

## 2012-06-25 NOTE — Progress Notes (Signed)
Referring Provider: Mirna Mires, MD Primary Care Physician:  Evlyn Courier, MD Primary GI: Dr. Jena Gauss   Chief Complaint  Patient presents with  . Follow-up    HPI79 year old female with hx of GERD, dysphagia, hematochezia, chronic constipation, wt loss, +FH of colon cancer in brother. Last EGD in 2005 with Schatzki's ring. Manometry and pH performed at [facility] in Sept 2012 with adequate GERD control with PPI, hypotensive LES. At last visit in October, I had recommended an EGD/ED/TCS due to symptoms. Her last TCS was in 2006 by Dr. Katrinka Blazing and normal. She has refused colonoscopy in the past. Amitiza prescribed at last visit, but she did not start this. States Miralax works well for her. She is titrating this herself. BM about twice per day. No further rectal bleeding.  Improvement in GERD and dysphagia with Nexium BID. Appetite is good. Weight is stable.  States her weight will dip down if something upsets her at home. Appears her weight has been overall stable since April 2012 in the 120s. There will be occasional dips down, but she has been maintaining for awhile.   CT Oct 2013 showed distended colon with stool, 2.8 cm hypovascular mass in left hepatic lobe, lung nodules. The liver findings were not new, based on review of prior records.  MRI April 2014: liver lesion likely benign hemangioma. Stable lung nodules.   Past Medical History  Diagnosis Date  . Memory loss 06/13/2009  . Irritable bowel syndrome 06/13/2009  . MIGRAINE HEADACHE 06/13/2009  . OSTEOARTHRITIS 06/13/2009  . HYPERTENSION 06/13/2009  . GERD Sept 2012    [facility], pH and manometry.. hiatal hernia present, resting pressure of LES hypotensive. normal peristalsis esophageal contractions. DeMeester score 0.2. Excellent control of GERD on PPI.   Marland Kitchen HYPERCHOLESTEROLEMIA 06/13/2009  . DM 06/13/2009    Past Surgical History  Procedure Laterality Date  . Appendectomy      40+ years ago  . Esophagogastroduodenoscopy   03/30/2003     WJX:[id number] ring otherwise normal esophagus/  Small hiatal hernia/ Normal stomach/ Status post dilation of the ring in an unusual manner  . Colonoscopy  03/30/2003    Poor prep precluded completion of the examination.  . Colonoscopy  June 2006    Dr. Elpidio Anis: normal  . 24 hour ph study  Sept 2012    DeMeester 0.2  . Esophageal manometry      hypotensive LES    Current Outpatient Prescriptions  Medication Sig Dispense Refill  . ALPRAZolam (XANAX) 0.5 MG tablet Take 0.5 mg by mouth 3 (three) times daily.      . Ascorbic Acid (VITAMIN C) 250 MG tablet Take 250 mg by mouth daily.        . bromocriptine (PARLODEL) 2.5 MG tablet Take 1 tablet (2.5 mg total) by mouth at bedtime.  30 tablet  11  . Calcium-Magnesium-Zinc (CALCIUM/MAGNESIUM/ZINC FORMULA) 1000-500-50 MG TABS Take 1 tablet by mouth 2 (two) times daily.        . Cholecalciferol (VITAMIN D) 2000 UNITS CAPS Take 1 capsule by mouth daily.        Marland Kitchen esomeprazole (NEXIUM) 40 MG capsule Take 1 capsule (40 mg total) by mouth 2 times daily at 12 noon and 4 pm.  60 capsule  5  . hydrocortisone (ANUSOL-HC) 25 MG suppository Place 1 suppository (25 mg total) rectally every 12 (twelve) hours. For 7 days.  14 suppository  1  . imipramine (TOFRANIL) 10 MG tablet Take 10 mg by mouth 4 (four) times  daily.      . losartan (COZAAR) 50 MG tablet Take 1 tablet (50 mg total) by mouth daily. TAKE 1 TABLET BY MOUTH EVERY DAY . Be sure patient keeps follow up appt. In 3 mnths  30 tablet  3  . lubiprostone (AMITIZA) 8 MCG capsule Take 1 capsule (8 mcg total) by mouth 2 (two) times daily with a meal.  14 capsule  0  . metFORMIN (GLUCOPHAGE) 500 MG tablet Take 1 tablet (500 mg total) by mouth 2 (two) times daily with a meal.  60 tablet  11  . pantoprazole (PROTONIX) 40 MG tablet Take 40 mg by mouth 2 (two) times daily.      . pioglitazone (ACTOS) 45 MG tablet Take 1 tablet (45 mg total) by mouth daily.  30 tablet  11  . polyethylene glycol  powder (GLYCOLAX/MIRALAX) powder Take 17 g by mouth daily. For constipation. Hold if loose stools.  255 g  3  . simvastatin (ZOCOR) 40 MG tablet Take 1 tablet (40 mg total) by mouth at bedtime.  30 tablet  11  . sitaGLIPtin (JANUVIA) 100 MG tablet Take 1 tablet (100 mg total) by mouth daily.  30 tablet  6  . verapamil (VERELAN PM) 120 MG 24 hr capsule Take 120 mg by mouth at bedtime.        . polyethylene glycol-electrolytes (TRILYTE) 420 G solution Take 4,000 mLs by mouth as directed.  4000 mL  0   No current facility-administered medications for this visit.    Allergies as of 06/25/2012  . (No Known Allergies)    Family History  Problem Relation Age of Onset  . Diabetes Father   . Diabetes Sister   . Colon cancer Brother     deceased at age  19, diagnosed age late 8s.     History   Social History  . Marital Status: Divorced    Spouse Name: N/A    Number of Children: N/A  . Years of Education: N/A   Occupational History  . [employer]     Retired   Social History Main Topics  . Smoking status: Never Smoker   . Smokeless tobacco: None  . Alcohol Use: No  . Drug Use: No  . Sexually Active: None   Other Topics Concern  . None   Social History Narrative  . None    Review of Systems: Negative unless mentioned in HPI.   Physical Exam: BP 125/72  Pulse 92  Temp(Src) 98.2 F (36.8 C) (Oral)  Ht 5\' 6"  (1.676 m)  Wt 121 lb (54.885 kg)  BMI 19.54 kg/m2 General:   Alert and oriented. No distress noted. Pleasant and cooperative.  Head:  Normocephalic and atraumatic. Eyes:  Conjuctiva clear without scleral icterus. Mouth:  Oral mucosa pink and moist.  Heart:  S1, S2 present without murmurs, rubs, or gallops. Regular rate and rhythm. Abdomen:  +BS, soft, non-tender and non-distended. No rebound or guarding.  Msk:  Symmetrical without gross deformities. Normal posture. Extremities:  Without edema. Neurologic:  Alert and  oriented x4;  grossly normal  neurologically. Skin:  Intact without significant lesions or rashes. Psych:  Alert and cooperative. Normal mood and affect.

## 2012-07-18 ENCOUNTER — Other Ambulatory Visit: Payer: Self-pay | Admitting: Endocrinology

## 2012-08-25 ENCOUNTER — Encounter: Payer: Self-pay | Admitting: Endocrinology

## 2012-08-25 ENCOUNTER — Ambulatory Visit (INDEPENDENT_AMBULATORY_CARE_PROVIDER_SITE_OTHER): Payer: Medicare Other | Admitting: Endocrinology

## 2012-08-25 VITALS — BP 126/68 | HR 75 | Ht 66.0 in | Wt 113.0 lb

## 2012-08-25 DIAGNOSIS — E119 Type 2 diabetes mellitus without complications: Secondary | ICD-10-CM

## 2012-08-25 NOTE — Progress Notes (Signed)
Subjective:    Patient ID: [name], female    DOB: 03-18-1929, 79 y.o.   MRN: [id number]  HPI Pt returns for f/u of type 2 DM (dx'ed 2000; she has mild if any neuropathy of the lower extremities. no known associated complications; she takes 4 oral agents).  pt states she feels well in general.  no cbg record, but states cbg's are in the mid-100's.   Past Medical History  Diagnosis Date  . Memory loss 06/13/2009  . Irritable bowel syndrome 06/13/2009  . MIGRAINE HEADACHE 06/13/2009  . OSTEOARTHRITIS 06/13/2009  . HYPERTENSION 06/13/2009  . GERD Sept 2012    [facility], pH and manometry.. hiatal hernia present, resting pressure of LES hypotensive. normal peristalsis esophageal contractions. DeMeester score 0.2. Excellent control of GERD on PPI.   Marland Kitchen HYPERCHOLESTEROLEMIA 06/13/2009  . DM 06/13/2009    Past Surgical History  Procedure Laterality Date  . Appendectomy      40+ years ago  . Esophagogastroduodenoscopy  03/30/2003     WUJ:[id number] ring otherwise normal esophagus/  Small hiatal hernia/ Normal stomach/ Status post dilation of the ring in an unusual manner  . Colonoscopy  03/30/2003    Poor prep precluded completion of the examination.  . Colonoscopy  June 2006    Dr. Elpidio Anis: normal  . 24 hour ph study  Sept 2012    DeMeester 0.2  . Esophageal manometry      hypotensive LES    History   Social History  . Marital Status: Divorced    Spouse Name: N/A    Number of Children: N/A  . Years of Education: N/A   Occupational History  . [employer]     Retired   Social History Main Topics  . Smoking status: Never Smoker   . Smokeless tobacco: Not on file  . Alcohol Use: No  . Drug Use: No  . Sexually Active: Not on file   Other Topics Concern  . Not on file   Social History Narrative  . No narrative on file    Current Outpatient Prescriptions on File Prior to Visit  Medication Sig Dispense Refill  . ALPRAZolam (XANAX) 0.5 MG tablet Take 0.5 mg by  mouth 3 (three) times daily.      . Ascorbic Acid (VITAMIN C) 250 MG tablet Take 250 mg by mouth daily.        . bromocriptine (PARLODEL) 2.5 MG tablet Take 1 tablet (2.5 mg total) by mouth at bedtime.  30 tablet  11  . Calcium-Magnesium-Zinc (CALCIUM/MAGNESIUM/ZINC FORMULA) 1000-500-50 MG TABS Take 1 tablet by mouth 2 (two) times daily.        . Cholecalciferol (VITAMIN D) 2000 UNITS CAPS Take 1 capsule by mouth daily.        Marland Kitchen esomeprazole (NEXIUM) 40 MG capsule Take 1 capsule (40 mg total) by mouth 2 times daily at 12 noon and 4 pm.  60 capsule  5  . hydrocortisone (ANUSOL-HC) 25 MG suppository Place 1 suppository (25 mg total) rectally every 12 (twelve) hours. For 7 days.  14 suppository  1  . imipramine (TOFRANIL) 10 MG tablet Take 10 mg by mouth 4 (four) times daily.      Marland Kitchen losartan (COZAAR) 50 MG tablet Take 1 tablet (50 mg total) by mouth daily.  30 tablet  3  . lubiprostone (AMITIZA) 8 MCG capsule Take 1 capsule (8 mcg total) by mouth 2 (two) times daily with a meal.  14 capsule  0  .  metFORMIN (GLUCOPHAGE) 500 MG tablet Take 1 tablet (500 mg total) by mouth 2 (two) times daily with a meal.  60 tablet  11  . pantoprazole (PROTONIX) 40 MG tablet Take 40 mg by mouth 2 (two) times daily.      . pioglitazone (ACTOS) 45 MG tablet Take 1 tablet (45 mg total) by mouth daily.  30 tablet  11  . polyethylene glycol powder (GLYCOLAX/MIRALAX) powder Take 17 g by mouth daily. For constipation. Hold if loose stools.  255 g  3  . polyethylene glycol-electrolytes (TRILYTE) 420 G solution Take 4,000 mLs by mouth as directed.  4000 mL  0  . simvastatin (ZOCOR) 40 MG tablet Take 1 tablet (40 mg total) by mouth at bedtime.  30 tablet  11  . sitaGLIPtin (JANUVIA) 100 MG tablet Take 1 tablet (100 mg total) by mouth daily.  30 tablet  6  . verapamil (VERELAN PM) 120 MG 24 hr capsule Take 120 mg by mouth at bedtime.         No current facility-administered medications on file prior to visit.    No Known  Allergies  Family History  Problem Relation Age of Onset  . Diabetes Father   . Diabetes Sister   . Colon cancer Brother     deceased at age  83, diagnosed age late 46s.    BP 126/68  Pulse 75  Ht 5\' 6"  (1.676 m)  Wt 113 lb (51.256 kg)  BMI 18.25 kg/m2  SpO2 98%  Review of Systems denies hypoglycemia and weigh change    Objective:   Physical Exam VITAL SIGNS:  See vs page GENERAL: no distress   Lab Results  Component Value Date   HGBA1C 7.7* 05/21/2012      Assessment & Plan:  DM: she needs increased rx; we discussed the nine oral agents available for type 2 diabetes.  This regimen gives the best risk-benefit ratio.

## 2012-08-25 NOTE — Patient Instructions (Addendum)
Please make a follow-up appointment in 3 months.   Your diabetes blood test is requested for you today.  We'll contact you with results.  If it is high, we'll add "acarbose."   check your blood sugar 1 time a day.  vary the time of day when you check, between before the 3 meals, and at bedtime.  also check if you have symptoms of your blood sugar being too high or too low.  please keep a record of the readings and bring it to your next appointment here.  please call us sooner if you are having low blood sugar episodes, or if it stays over 200.  Here are some samples of "tradjenta."  These are similar to [place] (take 1 per day).

## 2012-08-26 ENCOUNTER — Other Ambulatory Visit: Payer: Self-pay | Admitting: Endocrinology

## 2012-08-26 MED ORDER — ACARBOSE 25 MG PO TABS: 25.0000 mg | ORAL_TABLET | Freq: Three times a day (TID) | ORAL | Status: AC

## 2012-08-27 ENCOUNTER — Other Ambulatory Visit: Payer: Self-pay

## 2012-09-03 ENCOUNTER — Other Ambulatory Visit (HOSPITAL_COMMUNITY): Payer: Self-pay | Admitting: Family Medicine

## 2012-09-03 DIAGNOSIS — Z139 Encounter for screening, unspecified: Secondary | ICD-10-CM

## 2012-09-11 ENCOUNTER — Ambulatory Visit (HOSPITAL_COMMUNITY)
Admission: RE | Admit: 2012-09-11 | Discharge: 2012-09-11 | Disposition: A | Discharge status: Home / Self Care | Payer: Medicare Other | Source: Ambulatory Visit | Attending: Family Medicine | Admitting: Family Medicine

## 2012-09-11 DIAGNOSIS — Z1231 Encounter for screening mammogram for malignant neoplasm of breast: Secondary | ICD-10-CM | POA: Insufficient documentation

## 2012-09-11 DIAGNOSIS — Z139 Encounter for screening, unspecified: Secondary | ICD-10-CM

## 2012-09-16 DIAGNOSIS — I619 Nontraumatic intracerebral hemorrhage, unspecified: Secondary | ICD-10-CM

## 2012-09-16 HISTORY — DX: Nontraumatic intracerebral hemorrhage, unspecified: I61.9

## 2012-09-18 ENCOUNTER — Inpatient Hospital Stay (HOSPITAL_COMMUNITY)
Admission: EM | Admit: 2012-09-18 | Discharge: 2012-09-20 | DRG: 065 | Disposition: A | Discharge status: Home Health | Payer: Medicare Other | Attending: Neurology | Admitting: Neurology

## 2012-09-18 ENCOUNTER — Encounter (HOSPITAL_COMMUNITY): Payer: Self-pay | Admitting: Emergency Medicine

## 2012-09-18 ENCOUNTER — Emergency Department (HOSPITAL_COMMUNITY): Payer: Medicare Other

## 2012-09-18 DIAGNOSIS — E7800 Pure hypercholesterolemia, unspecified: Secondary | ICD-10-CM | POA: Diagnosis present

## 2012-09-18 DIAGNOSIS — I10 Essential (primary) hypertension: Secondary | ICD-10-CM | POA: Diagnosis present

## 2012-09-18 DIAGNOSIS — I619 Nontraumatic intracerebral hemorrhage, unspecified: Principal | ICD-10-CM | POA: Diagnosis present

## 2012-09-18 DIAGNOSIS — Z681 Body mass index (BMI) 19 or less, adult: Secondary | ICD-10-CM

## 2012-09-18 DIAGNOSIS — G8190 Hemiplegia, unspecified affecting unspecified side: Secondary | ICD-10-CM | POA: Diagnosis present

## 2012-09-18 DIAGNOSIS — Z79899 Other long term (current) drug therapy: Secondary | ICD-10-CM

## 2012-09-18 DIAGNOSIS — K589 Irritable bowel syndrome without diarrhea: Secondary | ICD-10-CM | POA: Diagnosis present

## 2012-09-18 DIAGNOSIS — E785 Hyperlipidemia, unspecified: Secondary | ICD-10-CM | POA: Diagnosis present

## 2012-09-18 DIAGNOSIS — E119 Type 2 diabetes mellitus without complications: Secondary | ICD-10-CM | POA: Diagnosis present

## 2012-09-18 DIAGNOSIS — K219 Gastro-esophageal reflux disease without esophagitis: Secondary | ICD-10-CM | POA: Diagnosis present

## 2012-09-18 DIAGNOSIS — M1990 Unspecified osteoarthritis, unspecified site: Secondary | ICD-10-CM | POA: Diagnosis present

## 2012-09-18 LAB — GLUCOSE, CAPILLARY
Glucose-Capillary: 186 mg/dL — ABNORMAL HIGH (ref 70–99)
Glucose-Capillary: 115 mg/dL — ABNORMAL HIGH (ref 70–99)
Glucose-Capillary: 108 mg/dL — ABNORMAL HIGH (ref 70–99)

## 2012-09-18 LAB — BASIC METABOLIC PANEL
CO2: 29 mEq/L (ref 19–32)
Chloride: 101 mEq/L (ref 96–112)
GFR calc non Af Amer: 55 mL/min — ABNORMAL LOW (ref >90)
Glucose, Bld: 211 mg/dL — ABNORMAL HIGH (ref 70–99)
Potassium: 4.3 mEq/L (ref 3.5–5.1)
Sodium: 138 mEq/L (ref 135–145)

## 2012-09-18 LAB — CBC WITH DIFFERENTIAL/PLATELET
HCT: 34.6 % — ABNORMAL LOW (ref 36.0–46.0)
Hemoglobin: 11.5 g/dL — ABNORMAL LOW (ref 12.0–15.0)
Lymphocytes Relative: 30 % (ref 12–46)
Lymphs Abs: 2 10*3/uL (ref 0.7–4.0)
MCH: 27.4 pg (ref 26.0–34.0)
MCHC: 33.2 g/dL (ref 30.0–36.0)
MCV: 82.6 fL (ref 78.0–100.0)
Neutro Abs: 3.8 10*3/uL (ref 1.7–7.7)
Neutrophils Relative %: 57 % (ref 43–77)
Platelets: 219 10*3/uL (ref 150–400)
RDW: 14.7 % (ref 11.5–15.5)

## 2012-09-18 LAB — URINALYSIS, ROUTINE W REFLEX MICROSCOPIC
Hgb urine dipstick: NEGATIVE
Specific Gravity, Urine: 1.011 (ref 1.005–1.030)
Urobilinogen, UA: 0.2 mg/dL (ref 0.0–1.0)

## 2012-09-18 LAB — URINE MICROSCOPIC-ADD ON

## 2012-09-18 MED ORDER — ACETAMINOPHEN 650 MG RE SUPP: 650.0000 mg | RECTAL | Status: DC | PRN

## 2012-09-18 MED ORDER — BROMOCRIPTINE MESYLATE 2.5 MG PO TABS
2.5000 mg | ORAL_TABLET | Freq: Every day | ORAL | Status: DC
  Administered 2012-09-18: 2.5 mg via ORAL
  Filled 2012-09-18 (×4): qty 1

## 2012-09-18 MED ORDER — VITAMIN D3 25 MCG (1000 UNIT) PO TABS
2000.0000 [IU] | ORAL_TABLET | Freq: Every day | ORAL | Status: DC
  Administered 2012-09-19 – 2012-09-20 (×2): 2000 [IU] via ORAL
  Filled 2012-09-18 (×2): qty 2

## 2012-09-18 MED ORDER — INSULIN ASPART 100 UNIT/ML ~~LOC~~ SOLN
0.0000 [IU] | Freq: Three times a day (TID) | SUBCUTANEOUS | Status: DC
  Administered 2012-09-19 (×2): 1 [IU] via SUBCUTANEOUS

## 2012-09-18 MED ORDER — ALPRAZOLAM 0.5 MG PO TABS
0.5000 mg | ORAL_TABLET | Freq: Three times a day (TID) | ORAL | Status: DC
  Administered 2012-09-18 – 2012-09-20 (×5): 0.5 mg via ORAL
  Filled 2012-09-18 (×6): qty 1

## 2012-09-18 MED ORDER — PANTOPRAZOLE SODIUM 40 MG IV SOLR
40.0000 mg | Freq: Every day | INTRAVENOUS | Status: DC
  Administered 2012-09-18: 40 mg via INTRAVENOUS
  Filled 2012-09-18 (×2): qty 40

## 2012-09-18 MED ORDER — VITAMIN D 50 MCG (2000 UT) PO CAPS: 1.0000 | ORAL_CAPSULE | Freq: Every day | ORAL | Status: DC

## 2012-09-18 MED ORDER — ACETAMINOPHEN 325 MG PO TABS: 650.0000 mg | ORAL_TABLET | ORAL | Status: DC | PRN

## 2012-09-18 MED ORDER — POLYETHYLENE GLYCOL 3350 17 GM/SCOOP PO POWD
17.0000 g | Freq: Every day | ORAL | Status: DC
  Administered 2012-09-18 – 2012-09-19 (×2): 17 g via ORAL
  Filled 2012-09-18 (×2): qty 255

## 2012-09-18 MED ORDER — SIMVASTATIN 40 MG PO TABS
40.0000 mg | ORAL_TABLET | Freq: Every day | ORAL | Status: DC
  Administered 2012-09-18: 40 mg via ORAL
  Filled 2012-09-18 (×2): qty 1

## 2012-09-18 MED ORDER — SENNOSIDES-DOCUSATE SODIUM 8.6-50 MG PO TABS
1.0000 | ORAL_TABLET | Freq: Two times a day (BID) | ORAL | Status: DC
  Administered 2012-09-18 – 2012-09-20 (×4): 1 via ORAL
  Filled 2012-09-18 (×5): qty 1

## 2012-09-18 MED ORDER — LINAGLIPTIN 5 MG PO TABS
5.0000 mg | ORAL_TABLET | Freq: Every day | ORAL | Status: DC
  Administered 2012-09-19 – 2012-09-20 (×2): 5 mg via ORAL
  Filled 2012-09-18 (×2): qty 1

## 2012-09-18 MED ORDER — IMIPRAMINE HCL 10 MG PO TABS
40.0000 mg | ORAL_TABLET | Freq: Every day | ORAL | Status: DC
  Administered 2012-09-18 – 2012-09-20 (×3): 40 mg via ORAL
  Filled 2012-09-18 (×3): qty 4

## 2012-09-18 MED ORDER — ACARBOSE 25 MG PO TABS
25.0000 mg | ORAL_TABLET | Freq: Three times a day (TID) | ORAL | Status: DC
Start: 2012-09-18 — End: 2012-09-20
  Administered 2012-09-18 – 2012-09-20 (×6): 25 mg via ORAL
  Filled 2012-09-18 (×9): qty 1

## 2012-09-18 MED ORDER — INSULIN ASPART 100 UNIT/ML ~~LOC~~ SOLN: 0.0000 [IU] | Freq: Every day | SUBCUTANEOUS | Status: DC

## 2012-09-18 MED ORDER — LABETALOL HCL 5 MG/ML IV SOLN
10.0000 mg | INTRAVENOUS | Status: DC | PRN
  Administered 2012-09-18 – 2012-09-19 (×3): 10 mg via INTRAVENOUS
  Filled 2012-09-18 (×3): qty 4

## 2012-09-18 MED ORDER — METFORMIN HCL 500 MG PO TABS
500.0000 mg | ORAL_TABLET | Freq: Two times a day (BID) | ORAL | Status: DC
  Administered 2012-09-18 – 2012-09-20 (×4): 500 mg via ORAL
  Filled 2012-09-18 (×7): qty 1

## 2012-09-18 NOTE — H&P (Addendum)
Admission H&P    Chief Complaint: Right leg weakness HPI: [name] is an 79 y.o. female who reports going to bed normal last evening.  Awakened today and felt her right leg give-way on her. Although she was able to ambulate and go up and down stairs continued to feel weak in her right leg.  With no improvement patient presented for evaluation.    Date last known well: 09/18/2012 Time last known well: Time: 01:00 tPA Given: No: ICH  Past Medical History  Diagnosis Date  . Memory loss 06/13/2009  . Irritable bowel syndrome 06/13/2009  . MIGRAINE HEADACHE 06/13/2009  . OSTEOARTHRITIS 06/13/2009  . HYPERTENSION 06/13/2009  . GERD Sept 2012    [facility], pH and manometry.. hiatal hernia present, resting pressure of LES hypotensive. normal peristalsis esophageal contractions. DeMeester score 0.2. Excellent control of GERD on PPI.   Marland Kitchen HYPERCHOLESTEROLEMIA 06/13/2009  . DM 06/13/2009    Past Surgical History  Procedure Laterality Date  . Appendectomy      40+ years ago  . Esophagogastroduodenoscopy  03/30/2003     VWU:[id number] ring otherwise normal esophagus/  Small hiatal hernia/ Normal stomach/ Status post dilation of the ring in an unusual manner  . Colonoscopy  03/30/2003    Poor prep precluded completion of the examination.  . Colonoscopy  June 2006    Dr. Elpidio Anis: normal  . 24 hour ph study  Sept 2012    DeMeester 0.2  . Esophageal manometry      hypotensive LES    Family History  Problem Relation Age of Onset  . Diabetes Father   . Diabetes Sister   . Colon cancer Brother     deceased at age  26, diagnosed age late 38s.    Social History:  reports that she has never smoked. She does not have any smokeless tobacco history on file. She reports that she does not drink alcohol or use illicit drugs.  Allergies: No Known Allergies  Medications: Current outpatient prescriptions:acarbose (PRECOSE) 25 MG tablet, Take 1 tablet (25 mg total) by mouth 3 (three)  times daily with meals., Disp: 90 tablet, Rfl: 11;  ALPRAZolam (XANAX) 0.5 MG tablet, Take 0.5 mg by mouth 3 (three) times daily., Disp: , Rfl: ;  bromocriptine (PARLODEL) 2.5 MG tablet, Take 1 tablet (2.5 mg total) by mouth at bedtime., Disp: 30 tablet, Rfl: 11 Cholecalciferol (VITAMIN D) 2000 UNITS CAPS, Take 1 capsule by mouth daily.  , Disp: , Rfl: ;  esomeprazole (NEXIUM) 40 MG capsule, Take 1 capsule (40 mg total) by mouth 2 times daily at 12 noon and 4 pm., Disp: 60 capsule, Rfl: 5;  imipramine (TOFRANIL) 10 MG tablet, Take 40 mg by mouth daily. , Disp: , Rfl: ;  linagliptin (TRADJENTA) 5 MG TABS tablet, Take 5 mg by mouth daily., Disp: , Rfl:  losartan (COZAAR) 50 MG tablet, Take 1 tablet (50 mg total) by mouth daily., Disp: 30 tablet, Rfl: 3;  metFORMIN (GLUCOPHAGE) 500 MG tablet, Take 1 tablet (500 mg total) by mouth 2 (two) times daily with a meal., Disp: 60 tablet, Rfl: 11;  polyethylene glycol powder (GLYCOLAX/MIRALAX) powder, Take 17 g by mouth daily. For constipation. Hold if loose stools., Disp: 255 g, Rfl: 3 simvastatin (ZOCOR) 40 MG tablet, Take 1 tablet (40 mg total) by mouth at bedtime., Disp: 30 tablet, Rfl: 11;  verapamil (VERELAN PM) 120 MG 24 hr capsule, Take 120 mg by mouth at bedtime.  , Disp: , Rfl:  ROS: History obtained from the patient  General ROS: negative for - chills, fatigue, fever, night sweats, weight gain or weight loss Psychological ROS: negative for - behavioral disorder, hallucinations, memory difficulties, mood swings or suicidal ideation Ophthalmic ROS: negative for - blurry vision, double vision, eye pain or loss of vision ENT ROS: difficulty swallowing Allergy and Immunology ROS: negative for - hives or itchy/watery eyes Hematological and Lymphatic ROS: negative for - bleeding problems, bruising or swollen lymph nodes Endocrine ROS: negative for - galactorrhea, hair pattern changes, polydipsia/polyuria or temperature intolerance Respiratory ROS: negative  for - cough, hemoptysis, shortness of breath or wheezing Cardiovascular ROS: negative for - chest pain, dyspnea on exertion, edema or irregular heartbeat Gastrointestinal ROS: negative for - abdominal pain, diarrhea, hematemesis, nausea/vomiting or stool incontinence Genito-Urinary ROS: negative for - dysuria, hematuria, incontinence or urinary frequency/urgency Musculoskeletal ROS: joint pain Neurological ROS: as noted in HPI Dermatological ROS: negative for rash and skin lesion changes  Physical Examination: Blood pressure 162/79, pulse 69, temperature 97.9 F (36.6 C), temperature source Oral, resp. rate 22, SpO2 98.00%.  General Examination: HEENT-  Normocephalic, no lesions, without obvious abnormality.  Normal external eye and conjunctiva.  Normal TM's bilaterally.  Normal auditory canals and external ears. Normal external nose, mucus membranes and septum.  Normal pharynx. Neck supple with no masses, nodes, nodules or enlargement. Cardiovascular - S1, S2 normal Lungs - chest clear, no wheezing, rales, normal symmetric air entry Abdomen - soft, non-tender; bowel sounds normal; no masses,  no organomegaly Extremities - no edema  Neurologic Examination: Mental Status: Alert, oriented, thought content appropriate.  Speech fluent without evidence of aphasia.  Able to follow 3 step commands without difficulty. Cranial Nerves: II: Discs flat bilaterally; Visual fields grossly normal, pupils equal, round, reactive to light and accommodation III,IV, VI: ptosis not present, extra-ocular motions intact bilaterally V,VII: smile symmetric, facial light touch sensation normal bilaterally VIII: hearing normal bilaterally IX,X: gag reflex present XI: bilateral shoulder shrug XII: midline tongue extension Motor: Right : Upper extremity   5/5    Left:     Upper extremity   5/5  Lower extremity   4/5     Lower extremity   5/5 Tone and bulk:normal tone throughout; no atrophy noted Sensory:  Pinprick and light touch intact throughout, bilaterally Deep Tendon Reflexes: 2+ and symmetric with absent AJ's bilaterally Plantars: Right: downgoing   Left: downgoing Cerebellar: normal finger-to-nose and normal heel-to-shin test Gait: Unable to test CV: pulses palpable throughout   Laboratory Studies:   Basic Metabolic Panel:  Recent Labs Lab 09/18/12 1128  NA 138  K 4.3  CL 101  CO2 29  GLUCOSE 211*  BUN 13  CREATININE 0.94  CALCIUM 9.4    Liver Function Tests: No results found for this basename: AST, ALT, ALKPHOS, BILITOT, PROT, ALBUMIN,  in the last 168 hours No results found for this basename: LIPASE, AMYLASE,  in the last 168 hours No results found for this basename: AMMONIA,  in the last 168 hours  CBC:  Recent Labs Lab 09/18/12 1128  WBC 6.6  NEUTROABS 3.8  HGB 11.5*  HCT 34.6*  MCV 82.6  PLT 219    Cardiac Enzymes: No results found for this basename: CKTOTAL, CKMB, CKMBINDEX, TROPONINI,  in the last 168 hours  BNP: No components found with this basename: POCBNP,   CBG:  Recent Labs Lab 09/18/12 1110  GLUCAP 186*    Microbiology: No results found for this or any previous visit.  Coagulation  Studies: No results found for this basename: LABPROT, INR,  in the last 72 hours  Urinalysis: No results found for this basename: COLORURINE, APPERANCEUR, LABSPEC, PHURINE, GLUCOSEU, HGBUR, BILIRUBINUR, KETONESUR, PROTEINUR, UROBILINOGEN, NITRITE, LEUKOCYTESUR,  in the last 168 hours  Lipid Panel:  No results found for this basename: chol, trig, hdl, cholhdl, vldl, ldlcalc    HgbA1C:  Lab Results  Component Value Date   HGBA1C 8.3* 08/25/2012    Urine Drug Screen:   No results found for this basename: labopia, cocainscrnur, labbenz, amphetmu, thcu, labbarb    Alcohol Level: No results found for this basename: ETH,  in the last 168 hours  Other results: EKG: sinus rhythm at 77 bpm  Imaging: Ct Head (brain) Wo Contrast  09/18/2012    *RADIOLOGY REPORT*  Clinical Data: CVA  CT HEAD WITHOUT CONTRAST  Technique:  Contiguous axial images were obtained from the base of the skull through the vertex without contrast.  Comparison: 04/03/2012  Findings: No skull fracture is noted.  Axial image 28 there is a focal hemorrhage with tiny amount of surrounding edema in the left parietal lobe high convexity measures 1.3 cm.  No intraventricular hemorrhage.  No mass effect or midline shift. Mild cerebral atrophy.  Periventricular white matter decreased attenuation probable due to chronic small vessel ischemic changes.  IMPRESSION:  1.  There is focal intraparenchymal hemorrhage with small amount of surrounding edema in the left parietal lobe high convexity measures 1.3 cm.  Hemorrhagic infarct cannot be excluded.  Clinical correlation is necessary.  Further evaluation with MRI is recommended. 2.  Mild cerebral atrophy.  Periventricular white matter decreased attenuation is probable due to chronic small vessel ischemic changes.  Critical findings discussed with Dr. Blinda Leatherwood   Original Report Authenticated By: Natasha Mead, M.D.    Assessment: 79 y.o. female presenting with complaitns of RLE weakness.  No sensory complaints.  Head CT reviewed and shows a high left parietal lobe area of ICH.  There is no evidence of midline shift.  BP reasonably controlled at this time.  Further work up recommended.    Stroke Risk Factors - diabetes mellitus, hyperlipidemia and hypertension  Plan: 1. HgbA1c, fasting lipid panel 2. MRI, MRA  of the brain without contrast 3. PT consult, OT consult 4. Echocardiogram 5. Carotid dopplers 6. Prophylactic therapy-None 7. Risk factor modification 8. Telemetry monitoring 9. Frequent neuro checks  This patient is critically ill and at significant risk of neurological worsening, death and care requires constant monitoring of vital signs, hemodynamics,respiratory and cardiac monitoring, neurological assessment, discussion with  family, other specialists and medical decision making of high complexity. I spent 45 minutes of neurocritical care time  in the care of  this patient.   Thana Farr, MD [facility] [phone] 09/18/2012, 2:37 PM

## 2012-09-18 NOTE — ED Notes (Addendum)
Pt reports last night while eating she has difficulty swallowing and getting her food to go down. Pt reports this AM when getting out of bed she noticed she couldn't walk because he right leg has been drooping. Denies numbness. Pt with slurred speech. No droop, no arm drift noted. Pt alert, oriented x4, NAD at present.

## 2012-09-18 NOTE — ED Provider Notes (Signed)
History    CSN: [id number] Arrival date & time 09/18/12  1040  First MD Initiated Contact with Patient 09/18/12 1112     Chief Complaint  Patient presents with  . Cerebrovascular Accident   (Consider location/radiation/quality/duration/timing/severity/associated sxs/prior Treatment) HPI Comments: Patient presents to ER for evaluation of right leg weakness. Patient reports that when she woke this morning she noticed that her right leg was weak. She tried to get out of bed and could not stand on it. There is no pain. She has not noticed numbness or tingling. Patient denies headache and blurred vision. She does report, however, that she had a slight headache last night. It was resolved upon awakening today. She has not had any chest pain, heart palpitations or shortness of breath.  Patient is a 79 y.o. female presenting with Acute Neurological Problem.  Cerebrovascular Accident   Past Medical History  Diagnosis Date  . Memory loss 06/13/2009  . Irritable bowel syndrome 06/13/2009  . MIGRAINE HEADACHE 06/13/2009  . OSTEOARTHRITIS 06/13/2009  . HYPERTENSION 06/13/2009  . GERD Sept 2012    [facility], pH and manometry.. hiatal hernia present, resting pressure of LES hypotensive. normal peristalsis esophageal contractions. DeMeester score 0.2. Excellent control of GERD on PPI.   Marland Kitchen HYPERCHOLESTEROLEMIA 06/13/2009  . DM 06/13/2009   Past Surgical History  Procedure Laterality Date  . Appendectomy      40+ years ago  . Esophagogastroduodenoscopy  03/30/2003     ION:[id number] ring otherwise normal esophagus/  Small hiatal hernia/ Normal stomach/ Status post dilation of the ring in an unusual manner  . Colonoscopy  03/30/2003    Poor prep precluded completion of the examination.  . Colonoscopy  June 2006    Dr. Elpidio Anis: normal  . 24 hour ph study  Sept 2012    DeMeester 0.2  . Esophageal manometry      hypotensive LES   Family History  Problem Relation Age of Onset  .  Diabetes Father   . Diabetes Sister   . Colon cancer Brother     deceased at age  9, diagnosed age late 48s.    History  Substance Use Topics  . Smoking status: Never Smoker   . Smokeless tobacco: Not on file  . Alcohol Use: No   OB History   Grav Para Term Preterm Abortions TAB SAB Ect Mult Living                 Review of Systems  Respiratory: Negative.   Cardiovascular: Negative.   Neurological: Positive for weakness.  All other systems reviewed and are negative.    Allergies  Review of patient's allergies indicates no known allergies.  Home Medications   Current Outpatient Rx  Name  Route  Sig  Dispense  Refill  . acarbose (PRECOSE) 25 MG tablet   Oral   Take 1 tablet (25 mg total) by mouth 3 (three) times daily with meals.   90 tablet   11   . ALPRAZolam (XANAX) 0.5 MG tablet   Oral   Take 0.5 mg by mouth 3 (three) times daily.         . Ascorbic Acid (VITAMIN C) 250 MG tablet   Oral   Take 250 mg by mouth daily.           . bromocriptine (PARLODEL) 2.5 MG tablet   Oral   Take 1 tablet (2.5 mg total) by mouth at bedtime.   30 tablet   11   .  Calcium-Magnesium-Zinc (CALCIUM/MAGNESIUM/ZINC FORMULA) 1000-500-50 MG TABS   Oral   Take 1 tablet by mouth 2 (two) times daily.           . Cholecalciferol (VITAMIN D) 2000 UNITS CAPS   Oral   Take 1 capsule by mouth daily.           Marland Kitchen esomeprazole (NEXIUM) 40 MG capsule   Oral   Take 1 capsule (40 mg total) by mouth 2 times daily at 12 noon and 4 pm.   60 capsule   5     Has tried and failed Prilosec, Protonix.   . hydrocortisone (ANUSOL-HC) 25 MG suppository   Rectal   Place 1 suppository (25 mg total) rectally every 12 (twelve) hours. For 7 days.   14 suppository   1   . imipramine (TOFRANIL) 10 MG tablet   Oral   Take 10 mg by mouth 4 (four) times daily.         Marland Kitchen losartan (COZAAR) 50 MG tablet   Oral   Take 1 tablet (50 mg total) by mouth daily.   30 tablet   3   .  lubiprostone (AMITIZA) 8 MCG capsule   Oral   Take 1 capsule (8 mcg total) by mouth 2 (two) times daily with a meal.   14 capsule   0   . metFORMIN (GLUCOPHAGE) 500 MG tablet   Oral   Take 1 tablet (500 mg total) by mouth 2 (two) times daily with a meal.   60 tablet   11     Pt was originally prescribed to take 1000 mg bid,  ...   . pantoprazole (PROTONIX) 40 MG tablet   Oral   Take 40 mg by mouth 2 (two) times daily.         . pioglitazone (ACTOS) 45 MG tablet   Oral   Take 1 tablet (45 mg total) by mouth daily.   30 tablet   11   . polyethylene glycol powder (GLYCOLAX/MIRALAX) powder   Oral   Take 17 g by mouth daily. For constipation. Hold if loose stools.   255 g   3   . polyethylene glycol-electrolytes (TRILYTE) 420 G solution   Oral   Take 4,000 mLs by mouth as directed.   4000 mL   0   . simvastatin (ZOCOR) 40 MG tablet   Oral   Take 1 tablet (40 mg total) by mouth at bedtime.   30 tablet   11   . sitaGLIPtin (JANUVIA) 100 MG tablet   Oral   Take 1 tablet (100 mg total) by mouth daily.   30 tablet   6   . verapamil (VERELAN PM) 120 MG 24 hr capsule   Oral   Take 120 mg by mouth at bedtime.            BP 142/71  Pulse 81  Temp(Src) 98.6 F (37 C) (Oral)  Resp 18  SpO2 98% Physical Exam  Constitutional: She is oriented to person, place, and time. She appears well-developed and well-nourished. No distress.  HENT:  Head: Normocephalic and atraumatic.  Right Ear: Hearing normal.  Left Ear: Hearing normal.  Nose: Nose normal.  Mouth/Throat: Oropharynx is clear and moist and mucous membranes are normal.  Eyes: Conjunctivae and EOM are normal. Pupils are equal, round, and reactive to light.  Neck: Normal range of motion. Neck supple.  Cardiovascular: Regular rhythm, S1 normal and S2 normal.  Exam reveals no gallop and no friction  rub.   No murmur heard. Pulmonary/Chest: Effort normal and breath sounds normal. No respiratory distress. She  exhibits no tenderness.  Abdominal: Soft. Normal appearance and bowel sounds are normal. There is no hepatosplenomegaly. There is no tenderness. There is no rebound, no guarding, no tenderness at McBurney's point and negative Murphy's sign. No hernia.  Musculoskeletal: Normal range of motion.  Neurological: She is alert and oriented to person, place, and time. No cranial nerve deficit or sensory deficit. Coordination normal. GCS eye subscore is 4. GCS verbal subscore is 5. GCS motor subscore is 6.  Right lower extremity strength 4/5 against gravity Left lower extremity strength 5/5 against gravity  Skin: Skin is warm, dry and intact. No rash noted. No cyanosis.  Psychiatric: She has a normal mood and affect. Her speech is normal and behavior is normal. Thought content normal.    ED Course  Procedures (including critical care time)  EKG:  Date: 09/18/2012  Rate: 77  Rhythm: normal sinus rhythm  QRS Axis: left  Intervals: normal  ST/T Wave abnormalities: normal  Conduction Disutrbances:none  Narrative Interpretation:   Old EKG Reviewed: unchanged    Labs Reviewed  GLUCOSE, CAPILLARY - Abnormal; Notable for the following:    Glucose-Capillary 186 (*)    All other components within normal limits  CBC WITH DIFFERENTIAL - Abnormal; Notable for the following:    Hemoglobin 11.5 (*)    HCT 34.6 (*)    All other components within normal limits  BASIC METABOLIC PANEL - Abnormal; Notable for the following:    Glucose, Bld 211 (*)    GFR calc non Af Amer 55 (*)    GFR calc Af Amer 64 (*)    All other components within normal limits  URINALYSIS, ROUTINE W REFLEX MICROSCOPIC   Ct Head (brain) Wo Contrast  09/18/2012   *RADIOLOGY REPORT*  Clinical Data: CVA  CT HEAD WITHOUT CONTRAST  Technique:  Contiguous axial images were obtained from the base of the skull through the vertex without contrast.  Comparison: 04/03/2012  Findings: No skull fracture is noted.  Axial image 28 there is a focal  hemorrhage with tiny amount of surrounding edema in the left parietal lobe high convexity measures 1.3 cm.  No intraventricular hemorrhage.  No mass effect or midline shift. Mild cerebral atrophy.  Periventricular white matter decreased attenuation probable due to chronic small vessel ischemic changes.  IMPRESSION:  1.  There is focal intraparenchymal hemorrhage with small amount of surrounding edema in the left parietal lobe high convexity measures 1.3 cm.  Hemorrhagic infarct cannot be excluded.  Clinical correlation is necessary.  Further evaluation with MRI is recommended. 2.  Mild cerebral atrophy.  Periventricular white matter decreased attenuation is probable due to chronic small vessel ischemic changes.  Critical findings discussed with Dr. Blinda Leatherwood   Original Report Authenticated By: Natasha Mead, M.D.   Diagnosis: Left parietal focal intraparenchymal hemorrhagic infarct  MDM  Patient presents to ER with complaints of right lower extremity weakness. Patient does have decreased strength in the right leg compared to the left. She does not have significant decreased sensation, however. No facial droop, speech difficulty. Upper extremity does not appear to be involved. Patient awakened with these symptoms, never a candidate for TPA or intervention based on timing. A CT was performed on arrival and does show evidence of hemorrhagic infarct. Doctor Thad Ranger, neurology has been consulted.   Gilda Crease, MD 09/18/12 1329

## 2012-09-19 ENCOUNTER — Inpatient Hospital Stay (HOSPITAL_COMMUNITY): Payer: Medicare Other

## 2012-09-19 LAB — GLUCOSE, CAPILLARY
Glucose-Capillary: 126 mg/dL — ABNORMAL HIGH (ref 70–99)
Glucose-Capillary: 96 mg/dL (ref 70–99)

## 2012-09-19 LAB — HEMOGLOBIN A1C: Mean Plasma Glucose: 174 mg/dL — ABNORMAL HIGH (ref <117)

## 2012-09-19 MED ORDER — PANTOPRAZOLE SODIUM 40 MG PO TBEC
40.0000 mg | DELAYED_RELEASE_TABLET | Freq: Every day | ORAL | Status: DC
  Administered 2012-09-19: 40 mg via ORAL
  Filled 2012-09-19: qty 1

## 2012-09-19 MED ORDER — VERAPAMIL HCL ER 120 MG PO TBCR
120.0000 mg | EXTENDED_RELEASE_TABLET | Freq: Every day | ORAL | Status: DC
  Administered 2012-09-19 – 2012-09-20 (×2): 120 mg via ORAL
  Filled 2012-09-19 (×2): qty 1

## 2012-09-19 MED ORDER — POLYETHYLENE GLYCOL 3350 17 G PO PACK
17.0000 g | PACK | Freq: Every day | ORAL | Status: DC
  Administered 2012-09-19 – 2012-09-20 (×2): 17 g via ORAL
  Filled 2012-09-19 (×2): qty 1

## 2012-09-19 MED ORDER — GADOBENATE DIMEGLUMINE 529 MG/ML IV SOLN
10.0000 mL | Freq: Once | INTRAVENOUS | Status: AC | PRN
  Administered 2012-09-19: 10 mL via INTRAVENOUS

## 2012-09-19 MED ORDER — LOSARTAN POTASSIUM 50 MG PO TABS
50.0000 mg | ORAL_TABLET | Freq: Every day | ORAL | Status: DC
  Administered 2012-09-19 – 2012-09-20 (×2): 50 mg via ORAL
  Filled 2012-09-19 (×2): qty 1

## 2012-09-19 MED ORDER — ATORVASTATIN CALCIUM 20 MG PO TABS
20.0000 mg | ORAL_TABLET | Freq: Every day | ORAL | Status: DC
  Administered 2012-09-19: 20 mg via ORAL
  Filled 2012-09-19 (×2): qty 1

## 2012-09-19 NOTE — Evaluation (Deleted)
Speech Language Pathology Evaluation Patient Details Name: [name] MRN: [id number] DOB: 02/10/1929 Today's Date: 09/19/2012 Time: 4098-1191 SLP Time Calculation (min): 25 min  Problem List:  Patient Active Problem List   Diagnosis Date Noted  . ICH (intracerebral hemorrhage) 09/18/2012  . Weight loss 01/02/2012  . Abdominal pain 01/02/2012  . Dysphagia 01/02/2012  . Hematochezia 01/02/2012  . Constipation 01/02/2012  . DM 06/13/2009  . HYPERCHOLESTEROLEMIA 06/13/2009  . MIGRAINE HEADACHE 06/13/2009  . HYPERTENSION 06/13/2009  . GERD 06/13/2009  . IRRITABLE BOWEL SYNDROME 06/13/2009  . OSTEOARTHRITIS 06/13/2009  . MEMORY LOSS 06/13/2009   Past Medical History:  Past Medical History  Diagnosis Date  . Memory loss 06/13/2009  . Irritable bowel syndrome 06/13/2009  . MIGRAINE HEADACHE 06/13/2009  . OSTEOARTHRITIS 06/13/2009  . HYPERTENSION 06/13/2009  . GERD Sept 2012    [facility], pH and manometry.. hiatal hernia present, resting pressure of LES hypotensive. normal peristalsis esophageal contractions. DeMeester score 0.2. Excellent control of GERD on PPI.   Marland Kitchen HYPERCHOLESTEROLEMIA 06/13/2009  . DM 06/13/2009   Past Surgical History:  Past Surgical History  Procedure Laterality Date  . Appendectomy      40+ years ago  . Esophagogastroduodenoscopy  03/30/2003     YNW:[id number] ring otherwise normal esophagus/  Small hiatal hernia/ Normal stomach/ Status post dilation of the ring in an unusual manner  . Colonoscopy  03/30/2003    Poor prep precluded completion of the examination.  . Colonoscopy  June 2006    Dr. Elpidio Anis: normal  . 24 hour ph study  Sept 2012    DeMeester 0.2  . Esophageal manometry      hypotensive LES   HPI:  79 y.o. female presenting with complaitns of RLE weakness.  No sensory complaints.  Head CT reviewed and shows a high left parietal lobe area of ICH.  There is no evidence of midline shift.     Assessment / Plan /  Recommendation Clinical Impression  Pt demonstrates mild dysarthria associated with right labial weakness. Though cognition otherwise appears WNL, pt has decreased awareness of this deficit. Intelligiblity is mildy reduced at conversation level. Pt would benefit from f/u SLP therapy during acute stay for initaition of exercise program and compensatory strategies.    SLP Assessment  Patient needs continued Speech Lanaguage Pathology Services    Follow Up Recommendations  None    Frequency and Duration min 2x/week  1 week   Pertinent Vitals/Pain NA   SLP Goals  SLP Goals Potential to Achieve Goals: Good SLP Goal #1: Pt will demosntrate understanding of home exercise program with min model SLP Goal #2: Pt will utilize compensatory strategies for articulation at conversation level over 5 minutes with min verbal cues.  SLP Goal #2 - Progress: Progressing toward goal  SLP Evaluation Prior Functioning  Cognitive/Linguistic Baseline: Within functional limits Type of Home: House  Lives With: Alone Education: Ph.D Vocation: Self employed (Chartered loss adjuster)   Cognition  Overall Cognitive Status: Within Functional Limits for tasks assessed Arousal/Alertness: Awake/alert Orientation Level: Oriented X4 Attention: Focused;Sustained;Selective;Alternating;Divided Focused Attention: Appears intact Sustained Attention: Appears intact Selective Attention: Appears intact Alternating Attention: Appears intact Divided Attention: Appears intact Memory: Appears intact Awareness: Impaired Awareness Impairment: Emergent impairment Problem Solving: Appears intact Executive Function:  (WNL) Safety/Judgment: Appears intact    Comprehension  Auditory Comprehension Overall Auditory Comprehension: Appears within functional limits for tasks assessed Reading Comprehension Reading Status: Within funtional limits    Expression Verbal Expression Overall Verbal Expression: Appears within  functional limits for  tasks assessed Written Expression Dominant Hand: Right   Oral / Motor Oral Motor/Sensory Function Overall Oral Motor/Sensory Function: Impaired Labial ROM: Reduced right Labial Symmetry: Abnormal symmetry right Labial Strength: Reduced Labial Sensation: Reduced Lingual ROM: Within Functional Limits Lingual Symmetry: Within Functional Limits Lingual Strength: Within Functional Limits Facial Symmetry: Right droop Motor Speech Overall Motor Speech: Impaired Respiration: Within functional limits Phonation: Normal Resonance: Within functional limits Articulation: Impaired Level of Impairment: Word Intelligibility: Intelligible Motor Planning: Witnin functional limits Motor Speech Errors: Cindi Carbon, MA CCC-SLP [phone]  Claudine Mouton 09/19/2012, 9:28 AM

## 2012-09-19 NOTE — Progress Notes (Signed)
1015 Pt transported to radiology for MRI.  Pt transported in wheelchair, on portable monitor, accomp by RN.  No adverse events during procedure.

## 2012-09-19 NOTE — Evaluation (Signed)
Physical Therapy Evaluation Patient Details Name: SAYA MCCOLL MRN: [id number] DOB: [birth year] Today's Date: 09/19/2012 Time: 4098-1191 PT Time Calculation (min): 29 min  PT Assessment / Plan / Recommendation History of Present Illness  Patient is an 79 y/o female admitted with right sided weakness head CT positive for interparenchymal hemorrhage in the left parietal lobe felt to be hypertensive in nature.  Clinical Impression  Patient presents with decreased balance, decreased LE strength, decreased safety and decreased knowledge of use of DME and will benefit from skilled PT in the acute setting to allow d/c home with initial 24 hour assist from family and HHPT.  Will need walker for home.  Plan to see prior to d/c to practice steps.    PT Assessment  Patient needs continued PT services    Follow Up Recommendations  Home health PT          Equipment Recommendations  Rolling walker with 5" wheels       Frequency Min 3X/week    Precautions / Restrictions Precautions Precautions: Fall   Pertinent Vitals/Pain Denies pain VSS with activity      Mobility  Bed Mobility Bed Mobility: Not assessed Details for Bed Mobility Assistance: pt up in chair Transfers Transfers: Sit to Stand;Stand to Sit Sit to Stand: 5: Supervision;From chair/3-in-1;With upper extremity assist;With armrests Stand to Sit: To chair/3-in-1;5: Supervision;With armrests;With upper extremity assist Details for Transfer Assistance: supervision for safety with monitor lines, definite need for UE assist Ambulation/Gait Ambulation/Gait Assistance: 4: Min assist;5: Supervision Ambulation Distance (Feet): 200 Feet Assistive device: None;Rolling walker Ambulation/Gait Assistance Details: initial 38' without device with slow pace, decreased step length and needing assist for balance; with walker needed assist on turns and cues for safety Gait Pattern: Step-through pattern;Decreased stride length;Narrow base of  support Modified Rankin (Stroke Patients Only) Pre-Morbid Rankin Score: No symptoms Modified Rankin: Moderately severe disability    Exercises Other Exercises Other Exercises: Educated in fall prevention at home including assist with stairs, ambulation with walker, lighting, foot wear and clear pathway   PT Diagnosis: Abnormality of gait  PT Problem List: Decreased strength;Decreased balance;Decreased mobility;Decreased knowledge of use of DME PT Treatment Interventions: Gait training;DME instruction;Balance training;Stair training;Therapeutic activities;Therapeutic exercise;Functional mobility training;Patient/family education     PT Goals(Current goals can be found in the care plan section) Acute Rehab PT Goals Patient Stated Goal: To return to "normal" soon PT Goal Formulation: With patient Time For Goal Achievement: 09/26/12 Potential to Achieve Goals: Good  Visit Information  Last PT Received On: 09/19/12 Assistance Needed: +1 History of Present Illness: Patient is an 79 y/o female admitted with right sided weakness head CT positive for interparenchymal hemorrhage in the left parietal lobe felt to be hypertensive in nature.       Prior Functioning  Home Living Family/patient expects to be discharged to:: Private residence Living Arrangements: Children Available Help at Discharge: Family;Available 24 hours/day (grandaughter there during the day ) Type of Home: House Home Access: Stairs to enter [employer] of Steps: 3 at front rails on both, 2 at garage no rails Home Layout: Two level;Bed/bath upstairs Alternate Level Stairs-Number of Steps: 13 Alternate Level Stairs-Rails: Left Home Equipment: None Prior Function Level of Independence: Independent Communication Communication: No difficulties Dominant Hand: Right    Cognition  Cognition Arousal/Alertness: Awake/alert Behavior During Therapy: WFL for tasks assessed/performed Overall Cognitive Status:  Within Functional Limits for tasks assessed    Extremity/Trunk Assessment Upper Extremity Assessment Upper Extremity Assessment: Defer to OT evaluation Lower Extremity  Assessment Lower Extremity Assessment: RLE deficits/detail;LLE deficits/detail RLE Deficits / Details: AROM WFL hip flexion 4-/5, knee extension 4+/5, flexion 4-/5, ankle dorsiflexion 4/5 LLE Deficits / Details: AROM WFL hip flexion 4/5, knee extension 4+/5, flexion 4-/5, ankle dorsiflexion 4-/5   Balance Balance Balance Assessed: Yes Dynamic Sitting Balance Dynamic Sitting - Balance Support: Feet supported;No upper extremity supported Dynamic Sitting - Level of Assistance: 5: Stand by assistance Dynamic Sitting - Balance Activities: Reaching for objects Dynamic Sitting - Comments: reaching to feet Static Standing Balance Static Standing - Balance Support: No upper extremity supported Static Standing - Level of Assistance: 5: Stand by assistance  End of Session PT - End of Session Equipment Utilized During Treatment: Gait belt Activity Tolerance: Patient tolerated treatment well Patient left: in chair;with call bell/phone within reach;with family/visitor present  GP     [facility] 09/19/2012, 1:17 PM Sheran Lawless, PT [phone] 09/19/2012

## 2012-09-19 NOTE — Progress Notes (Signed)
Stroke Team Progress Note  HISTORY  Chief Complaint: Right leg weakness   HPI: [name] is an 79 y.o. female who reports going to bed normal last evening. Awakened today and felt her right leg give-way on her. Although she was able to ambulate and go up and down stairs continued to feel weak in her right leg. With no improvement patient presented for evaluation.   Date last known well: 09/18/2012  Time last known well: Time: 01:00  tPA Given: No: ICH   Patient was not a TPA candidate secondary to hemorrhage. She was admitted to the neuro ICU for further evaluation and treatment.  SUBJECTIVE  Lying in bed with daughter in room. Feels better with more movement in right leg, still somewhat weak.   OBJECTIVE Most recent Vital Signs: Filed Vitals:   09/19/12 0405 09/19/12 0500 09/19/12 0600 09/19/12 0700  BP:  134/79 150/74 157/68  Pulse:  68 75 72  Temp: 97.5 F (36.4 C)     TempSrc: Oral     Resp:  20 22 17   Height:      Weight:      SpO2:  97% 97% 96%   CBG (last 3)   Recent Labs  09/18/12 1110 09/18/12 1726 09/18/12 2212  GLUCAP 186* 115* 108*    IV Fluid Intake:     MEDICATIONS  . acarbose  25 mg Oral TID WC  . ALPRAZolam  0.5 mg Oral TID  . bromocriptine  2.5 mg Oral QHS  . cholecalciferol  2,000 Units Oral Daily  . imipramine  40 mg Oral Daily  . insulin aspart  0-5 Units Subcutaneous QHS  . insulin aspart  0-9 Units Subcutaneous TID WC  . linagliptin  5 mg Oral Daily  . metFORMIN  500 mg Oral BID WC  . pantoprazole (PROTONIX) IV  40 mg Intravenous QHS  . polyethylene glycol powder  17 g Oral Daily  . senna-docusate  1 tablet Oral BID  . simvastatin  40 mg Oral QHS   PRN:  acetaminophen, acetaminophen, labetalol  Diet:  Carb Control thins liquids Activity:  Bedrest DVT Prophylaxis:  scd  CLINICALLY SIGNIFICANT STUDIES Basic Metabolic Panel:  Recent Labs Lab 09/18/12 1128  NA 138  K 4.3  CL 101  CO2 29  GLUCOSE 211*  BUN 13  CREATININE  0.94  CALCIUM 9.4   Liver Function Tests: No results found for this basename: AST, ALT, ALKPHOS, BILITOT, PROT, ALBUMIN,  in the last 168 hours CBC:  Recent Labs Lab 09/18/12 1128  WBC 6.6  NEUTROABS 3.8  HGB 11.5*  HCT 34.6*  MCV 82.6  PLT 219   Coagulation: No results found for this basename: LABPROT, INR,  in the last 168 hours Cardiac Enzymes: No results found for this basename: CKTOTAL, CKMB, CKMBINDEX, TROPONINI,  in the last 168 hours Urinalysis:  Recent Labs Lab 09/18/12 1456  COLORURINE YELLOW  LABSPEC 1.011  PHURINE 8.0  GLUCOSEU NEGATIVE  HGBUR NEGATIVE  BILIRUBINUR NEGATIVE  KETONESUR NEGATIVE  PROTEINUR NEGATIVE  UROBILINOGEN 0.2  NITRITE NEGATIVE  LEUKOCYTESUR SMALL*   Lipid Panel No results found for this basename: chol, trig, hdl, cholhdl, vldl, ldlcalc   HgbA1C  Lab Results  Component Value Date   HGBA1C 7.7* 09/18/2012    Urine Drug Screen:   No results found for this basename: labopia, cocainscrnur, labbenz, amphetmu, thcu, labbarb    Alcohol Level: No results found for this basename: ETH,  in the last 168 hours  Ct Head (  brain) Wo Contrast 09/18/2012  1.  There is focal intraparenchymal hemorrhage with small amount of surrounding edema in the left parietal lobe high convexity measures 1.3 cm.  Hemorrhagic infarct cannot be excluded.  Clinical correlation is necessary.  Further evaluation with MRI is recommended. 2.  Mild cerebral atrophy.  Periventricular white matter decreased attenuation is probable due to chronic small vessel ischemic changes.       MRI of the brain    MRA of the brain    2D Echocardiogram    Carotid Doppler    CXR    EKG  normal sinus rhythm.   Therapy Recommendations   Physical Exam   Mental Status:  Alert, oriented, thought content appropriate. Speech fluent without evidence of aphasia. Able to follow 3 step commands without difficulty.  Cranial Nerves:  II: Discs flat bilaterally; Visual fields grossly  normal, pupils equal, round, reactive to light and accommodation  III,IV, VI: ptosis not present, extra-ocular motions intact bilaterally  V,VII: smile symmetric, facial light touch sensation normal bilaterally  VIII: hearing normal bilaterally  IX,X: gag reflex present  XI: bilateral shoulder shrug  XII: midline tongue extension  Motor:  Right : Upper extremity 5/5 Left: Upper extremity 5/5  Lower extremity 4/5 Lower extremity 5/5  Tone and bulk:normal tone throughout; no atrophy noted  Sensory: Pinprick and light touch intact throughout, bilaterally  Deep Tendon Reflexes: 2+ and symmetric with absent AJ's bilaterally  Plantars:  Right: downgoing Left: downgoing  Cerebellar:  normal finger-to-nose and normal heel-to-shin test  Gait: Unable to test  CV: pulses palpable throughout    ASSESSMENT [name] is a 79 y.o. female presenting with right hemiparesis. Imaging confirms a focal intraparenchymal hemorrhage with small amount of surrounding edema in the left parietal lobes, felt to be hypertensive in nature.  On no antithrombotics prior to admission. Now on no antithrombotics for secondary stroke prevention. Patient with resultant right hemiparesis. Work up underway.   Hypertension  Hyperlipidemia  Diabetes mellitus  Long term medication use  Hospital day # 1  TREATMENT/PLAN  Continue no antithrombotics due to hemorrhage for secondary stroke prevention.  Obtain MRI/A Brain to re-assess hemorrhage; transfer if none visualized.  Ambulate  If remains stable, plan for discharge in am  Therapy  Plan discussed with patient and family  Plan discharge in am  [name]. [name], [facility], MBA, MHA [facility] Pager: [phone] 09/19/2012 10:05 AM  I have personally obtained a history, examined the patient, evaluated imaging results, and formulated the assessment and plan of care. I agree with the above. Delia Heady, MD

## 2012-09-19 NOTE — Evaluation (Signed)
Occupational Therapy Evaluation Patient Details Name: [name] MRN: [id number] DOB: [birth year] Today's Date: 09/19/2012 Time: 1914-7829 OT Time Calculation (min): 15 min  OT Assessment / Plan / Recommendation History of present illness Patient is an 79 y/o female admitted with right sided weakness head CT positive for interparenchymal hemorrhage in the left parietal lobe felt to be hypertensive in nature.   Clinical Impression   Pt overall supervision level with mobility and ADLs.  Will recommend 24/7 supervision (initially) upon return home. No further acute OT services needed. Will sign off.    OT Assessment  Patient does not need any further OT services    Follow Up Recommendations  No OT follow up;Supervision/Assistance - 24 hour    Barriers to Discharge      Equipment Recommendations  None recommended by OT    Recommendations for Other Services    Frequency       Precautions / Restrictions Precautions Precautions: Fall   Pertinent Vitals/Pain See vitals    ADL  Grooming: Performed;Wash/dry hands;Brushing hair;Supervision/safety Where Assessed - Grooming: Unsupported standing Upper Body Bathing: Simulated;Set up;Supervision/safety Where Assessed - Upper Body Bathing: Unsupported sitting Lower Body Bathing: Simulated;Supervision/safety Where Assessed - Lower Body Bathing: Unsupported sit to stand Upper Body Dressing: Simulated;Supervision/safety;Set up Where Assessed - Upper Body Dressing: Unsupported sitting Lower Body Dressing: Performed;Supervision/safety Where Assessed - Lower Body Dressing: Unsupported sit to stand Toilet Transfer: Simulated;Supervision/safety Toilet Transfer Method: Sit to Barista:  (chair) Equipment Used: Rolling walker Transfers/Ambulation Related to ADLs: supervision with RW ADL Comments: Pt near baseline. Recommended to pt's daughter that pt have 24/7 supervision/assist initailly to ensure safety.    OT  Diagnosis:    OT Problem List:   OT Treatment Interventions:     OT Goals(Current goals can be found in the care plan section) Acute Rehab OT Goals Patient Stated Goal: To return to "normal" soon  Visit Information  Last OT Received On: 09/19/12 Assistance Needed: +1 History of Present Illness: Patient is an 79 y/o female admitted with right sided weakness head CT positive for interparenchymal hemorrhage in the left parietal lobe felt to be hypertensive in nature.       Prior Functioning     Home Living Family/patient expects to be discharged to:: Private residence Living Arrangements: Children Available Help at Discharge: Family;Available 24 hours/day Type of Home: House Home Access: Stairs to enter [employer] of Steps: 3 at front rails on both, 2 at garage no rails Home Layout: Two level;Bed/bath upstairs Alternate Level Stairs-Number of Steps: 13 Alternate Level Stairs-Rails: Left Home Equipment: None  Lives With: Family Prior Function Level of Independence: Independent Communication Communication: No difficulties Dominant Hand: Right         Vision/Perception Vision - History Patient Visual Report: No change from baseline   Cognition  Cognition Arousal/Alertness: Awake/alert Behavior During Therapy: WFL for tasks assessed/performed Overall Cognitive Status: Within Functional Limits for tasks assessed    Extremity/Trunk Assessment Upper Extremity Assessment Upper Extremity Assessment: RUE deficits/detail RUE Deficits / Details: 4/5 throughout. WFL for tasks assessed. Lower Extremity Assessment Lower Extremity Assessment: RLE deficits/detail;LLE deficits/detail RLE Deficits / Details: AROM WFL hip flexion 4-/5, knee extension 4+/5, flexion 4-/5, ankle dorsiflexion 4/5 LLE Deficits / Details: AROM WFL hip flexion 4/5, knee extension 4+/5, flexion 4-/5, ankle dorsiflexion 4-/5     Mobility Bed Mobility Bed Mobility: Not assessed Details for Bed  Mobility Assistance: pt up in chair Transfers Transfers: Sit to Stand;Stand to Sit Sit to Stand:  5: Supervision;From chair/3-in-1;With upper extremity assist Stand to Sit: 5: Supervision;To chair/3-in-1;With upper extremity assist Details for Transfer Assistance: supervision for safety with monitor lines, definite need for UE assist     Exercise   Balance Balance Balance Assessed: Yes  Static Standing Balance Static Standing - Balance Support: No upper extremity supported Static Standing - Level of Assistance: 5: Stand by assistance Static Standing - Comment/# of Minutes: 3 minutes   End of Session OT - End of Session Equipment Utilized During Treatment: Rolling walker Activity Tolerance: Patient tolerated treatment well Patient left: in chair;with call bell/phone within reach;with family/visitor present;with nursing/sitter in room Nurse Communication: Mobility status  GO    09/19/2012 Cipriano Mile OTR/L Pager [phone] Office [phone]  Cipriano Mile 09/19/2012, 2:45 PM

## 2012-09-19 NOTE — Evaluation (Signed)
Speech Language Pathology Evaluation Patient Details Name: [name] MRN: [id number] DOB: 26-Jan-1929 Today's Date: 09/19/2012 Time: 1191-4782 SLP Time Calculation (min): 25 min  Problem List:  Patient Active Problem List   Diagnosis Date Noted  . ICH (intracerebral hemorrhage) 09/18/2012  . Weight loss 01/02/2012  . Abdominal pain 01/02/2012  . Dysphagia 01/02/2012  . Hematochezia 01/02/2012  . Constipation 01/02/2012  . DM 06/13/2009  . HYPERCHOLESTEROLEMIA 06/13/2009  . MIGRAINE HEADACHE 06/13/2009  . HYPERTENSION 06/13/2009  . GERD 06/13/2009  . IRRITABLE BOWEL SYNDROME 06/13/2009  . OSTEOARTHRITIS 06/13/2009  . MEMORY LOSS 06/13/2009   Past Medical History:  Past Medical History  Diagnosis Date  . Memory loss 06/13/2009  . Irritable bowel syndrome 06/13/2009  . MIGRAINE HEADACHE 06/13/2009  . OSTEOARTHRITIS 06/13/2009  . HYPERTENSION 06/13/2009  . GERD Sept 2012    [facility], pH and manometry.. hiatal hernia present, resting pressure of LES hypotensive. normal peristalsis esophageal contractions. DeMeester score 0.2. Excellent control of GERD on PPI.   Marland Kitchen HYPERCHOLESTEROLEMIA 06/13/2009  . DM 06/13/2009   Past Surgical History:  Past Surgical History  Procedure Laterality Date  . Appendectomy      40+ years ago  . Esophagogastroduodenoscopy  03/30/2003     [id number] ring otherwise normal esophagus/  Small hiatal hernia/ Normal stomach/ Status post dilation of the ring in an unusual manner  . Colonoscopy  03/30/2003    Poor prep precluded completion of the examination.  . Colonoscopy  June 2006    Dr. Elpidio Anis: normal  . 24 hour ph study  Sept 2012    DeMeester 0.2  . Esophageal manometry      hypotensive LES   HPI:  79 y.o. female presenting with complaitns of RLE weakness.  No sensory complaints.  Head CT reviewed and shows a high left parietal lobe area of ICH.  There is no evidence of midline shift.     Assessment / Plan /  Recommendation Clinical Impression  Pts cognitive linguistic function WNL. No SLP f/u needed at this time.     SLP Assessment  Patient does not need any further Speech Lanaguage Pathology Services    Follow Up Recommendations  None    Frequency and Duration        Pertinent Vitals/Pain NA   SLP Goals     SLP Evaluation Prior Functioning  Cognitive/Linguistic Baseline: Within functional limits Type of Home: House  Lives With: Family Vocation: Retired Environmental education officer)   Cognition  Overall Cognitive Status: Within Functional Limits for tasks assessed Arousal/Alertness: Awake/alert Orientation Level: Oriented X4 Attention: Focused;Sustained;Selective;Alternating;Divided Focused Attention: Appears intact Sustained Attention: Appears intact Selective Attention: Appears intact Alternating Attention: Appears intact Divided Attention: Appears intact Memory: Appears intact Awareness: Appears intact Problem Solving: Appears intact Executive Function:  (WNL) Safety/Judgment: Appears intact    Comprehension  Auditory Comprehension Overall Auditory Comprehension: Appears within functional limits for tasks assessed Reading Comprehension Reading Status: Within funtional limits    Expression Verbal Expression Overall Verbal Expression: Appears within functional limits for tasks assessed Written Expression Dominant Hand: Right   Oral / Motor Oral Motor/Sensory Function Overall Oral Motor/Sensory Function: Appears within functional limits for tasks assessed Lingual Strength: Within Functional Limits   GO    Harlon Ditty, [place] CCC-SLP [phone]  Claudine Mouton 09/19/2012, 9:33 AM

## 2012-09-20 DIAGNOSIS — I619 Nontraumatic intracerebral hemorrhage, unspecified: Secondary | ICD-10-CM

## 2012-09-20 DIAGNOSIS — E119 Type 2 diabetes mellitus without complications: Secondary | ICD-10-CM

## 2012-09-20 DIAGNOSIS — I10 Essential (primary) hypertension: Secondary | ICD-10-CM

## 2012-09-20 LAB — GLUCOSE, CAPILLARY: Glucose-Capillary: 105 mg/dL — ABNORMAL HIGH (ref 70–99)

## 2012-09-20 NOTE — Care Management Note (Signed)
CM spoke with patient's adult daughter per pt's permission concerning discharge planning. Pt's family offered choice for [facility]. Per pt's choice [facility] to provide [facility] services. Cm spoke with [facility]  Rep [place] concerning referral. Pt received RW prior to discharge. Pt's daughter would like to be contacted concerning arrangement of HH services at [phone]. No other barriers identified.   Roxy Manns Stephene Alegria,RN,BSN [phone]

## 2012-09-20 NOTE — Progress Notes (Signed)
Physical Therapy Treatment Patient Details Name: [name] MRN: [id number] DOB: Nov 26, 1928 Today's Date: 09/20/2012 Time: 4098-1191 PT Time Calculation (min): 31 min  PT Assessment / Plan / Recommendation  PT Comments   Patient progressing with mobility with walker.  Did demonstrate high fall risk per Sharlene Motts balance test with score 41/56 (less than 45/56 high risk for falls.)  Able to negotiate stairs with rail safely with supervision.  Agree with d/c home with initial 24 hour assist and HHPT.  Follow Up Recommendations  Home health PT;Supervision/Assistance - 24 hour (initial 24 hour assist)           Equipment Recommendations  Rolling walker with 5" wheels       Frequency Min 3X/week   Progress towards PT Goals Progress towards PT goals: Progressing toward goals  Plan Current plan remains appropriate    Precautions / Restrictions Precautions Precautions: Fall Restrictions Weight Bearing Restrictions: No   Pertinent Vitals/Pain No pain complaints    Mobility  Bed Mobility Bed Mobility: Sitting - Scoot to Edge of Bed Sitting - Scoot to Edge of Bed: 7: Independent Details for Bed Mobility Assistance: pt sitting at edge of bed Transfers Sit to Stand: From bed;6: Modified independent (Device/Increase time);From chair/3-in-1;With upper extremity assist;With armrests Stand to Sit: To bed;6: Modified independent (Device/Increase time);To chair/3-in-1;With armrests;With upper extremity assist Details for Transfer Assistance: slow, but demonstrates safe technique Ambulation/Gait Ambulation/Gait Assistance: 5: Supervision;4: Min guard Ambulation Distance (Feet): 200 Feet Assistive device: Rolling walker;None Ambulation/Gait Assistance Details: Ambulated about 30' no device with right "limp" due to weakness minguard assist needed.  With walker able to ambulate without assist Gait Pattern: Decreased stride length;Shuffle;Decreased stance time - right;Decreased step length -  left;Narrow base of support Stairs: Yes Stairs Assistance: 5: Supervision Stairs Assistance Details (indicate cue type and reason): cues to use both hands on one rail for safety  Stair Management Technique: Forwards;One rail Left;Alternating pattern Number of Stairs: 10 Modified Rankin (Stroke Patients Only) Pre-Morbid Rankin Score: No symptoms Modified Rankin: Moderate disability    Exercises Other Exercises Other Exercises: Educated in fall risk per Berg balance test and need to use walker full time for now for fall prevention.  Also educated can improve with therapy to use lesser or no device in the future.     PT Goals (current goals can now be found in the care plan section)    Visit Information  Last PT Received On: 09/20/12    Subjective Data   Reports feeling stronger today.   Cognition  Cognition Arousal/Alertness: Awake/alert Behavior During Therapy: WFL for tasks assessed/performed Overall Cognitive Status: Within Functional Limits for tasks assessed    Balance  Standardized Balance Assessment Standardized Balance Assessment: Berg Balance Test Berg Balance Test Sit to Stand: Able to stand without using hands and stabilize independently Standing Unsupported: Able to stand safely 2 minutes Sitting with Back Unsupported but Feet Supported on Floor or Stool: Able to sit safely and securely 2 minutes Stand to Sit: Sits safely with minimal use of hands Transfers: Able to transfer safely, definite need of hands Standing Unsupported with Eyes Closed: Able to stand 10 seconds with supervision Standing Ubsupported with Feet Together: Able to place feet together independently and stand for 1 minute with supervision From Standing, Reach Forward with Outstretched Arm: Can reach forward >12 cm safely (5") From Standing Position, Pick up Object from Floor: Able to pick up shoe, needs supervision From Standing Position, Turn to Look Behind Over each Shoulder: Looks behind  one  side only/other side shows less weight shift Turn 360 Degrees: Able to turn 360 degrees safely but slowly Standing Unsupported, Alternately Place Feet on Step/Stool: Able to complete >2 steps/needs minimal assist Standing Unsupported, One Foot in Front: Able to plae foot ahead of the other independently and hold 30 seconds Standing on One Leg: Tries to lift leg/unable to hold 3 seconds but remains standing independently Total Score: 41  End of Session PT - End of Session Equipment Utilized During Treatment: Gait belt Activity Tolerance: Patient tolerated treatment well Patient left: with bed alarm set;with call bell/phone within reach   GP     [facility] 09/20/2012, 10:31 AM Sheran Lawless, PT [phone] 09/20/2012

## 2012-09-20 NOTE — Discharge Summary (Signed)
Stroke Discharge Summary  Patient ID: [name]   MRN: [id number]      DOB: 06/07/1928  Date of Admission: 09/18/2012 Date of Discharge: 09/20/2012  Attending Physician:  Darcella Cheshire, MD, Stroke MD  Consulting Physician(s):   Treatment Team:  Md Stroke, MD None  Patient's PCP:  Evlyn Courier, MD  Discharge Diagnoses:  Active Problems:   HYPERTENSION   DM   ICH (intracerebral hemorrhage)  BMI: Body mass index is 18.01 kg/(m^2).  Past Medical History  Diagnosis Date  . Memory loss 06/13/2009  . Irritable bowel syndrome 06/13/2009  . MIGRAINE HEADACHE 06/13/2009  . OSTEOARTHRITIS 06/13/2009  . HYPERTENSION 06/13/2009  . GERD Sept 2012    [facility], pH and manometry.. hiatal hernia present, resting pressure of LES hypotensive. normal peristalsis esophageal contractions. DeMeester score 0.2. Excellent control of GERD on PPI.   Marland Kitchen HYPERCHOLESTEROLEMIA 06/13/2009  . DM 06/13/2009   Past Surgical History  Procedure Laterality Date  . Appendectomy      40+ years ago  . Esophagogastroduodenoscopy  03/30/2003     AOZ:HYQMVHQI ring otherwise normal esophagus/  Small hiatal hernia/ Normal stomach/ Status post dilation of the ring in an unusual manner  . Colonoscopy  03/30/2003    Poor prep precluded completion of the examination.  . Colonoscopy  June 2006    Dr. Elpidio Anis: normal  . 24 hour ph study  Sept 2012    DeMeester 0.2  . Esophageal manometry      hypotensive LES      Medication List         acarbose 25 MG tablet  Commonly known as:  PRECOSE  Take 1 tablet (25 mg total) by mouth 3 (three) times daily with meals.     ALPRAZolam 0.5 MG tablet  Commonly known as:  XANAX  Take 0.5 mg by mouth 3 (three) times daily.     bromocriptine 2.5 MG tablet  Commonly known as:  PARLODEL  Take 1 tablet (2.5 mg total) by mouth at bedtime.     esomeprazole 40 MG capsule  Commonly known as:  NEXIUM  Take 1 capsule (40 mg total) by mouth 2 times daily at 12  noon and 4 pm.     imipramine 10 MG tablet  Commonly known as:  TOFRANIL  Take 40 mg by mouth daily.     linagliptin 5 MG Tabs tablet  Commonly known as:  TRADJENTA  Take 5 mg by mouth daily.     losartan 50 MG tablet  Commonly known as:  COZAAR  Take 1 tablet (50 mg total) by mouth daily.     metFORMIN 500 MG tablet  Commonly known as:  GLUCOPHAGE  Take 1 tablet (500 mg total) by mouth 2 (two) times daily with a meal.     polyethylene glycol powder powder  Commonly known as:  GLYCOLAX/MIRALAX  Take 17 g by mouth daily. For constipation. Hold if loose stools.     simvastatin 40 MG tablet  Commonly known as:  ZOCOR  Take 1 tablet (40 mg total) by mouth at bedtime.     verapamil 120 MG 24 hr capsule  Commonly known as:  VERELAN PM  Take 120 mg by mouth at bedtime.     Vitamin D 2000 UNITS Caps  Take 1 capsule by mouth daily.        LABORATORY STUDIES CBC    Component Value Date/Time   WBC 6.6 09/18/2012 1128  RBC 4.19 09/18/2012 1128   HGB 11.5* 09/18/2012 1128   HCT 34.6* 09/18/2012 1128   PLT 219 09/18/2012 1128   MCV 82.6 09/18/2012 1128   MCH 27.4 09/18/2012 1128   MCHC 33.2 09/18/2012 1128   RDW 14.7 09/18/2012 1128   LYMPHSABS 2.0 09/18/2012 1128   MONOABS 0.7 09/18/2012 1128   EOSABS 0.1 09/18/2012 1128   BASOSABS 0.0 09/18/2012 1128   CMP    Component Value Date/Time   NA 138 09/18/2012 1128   K 4.3 09/18/2012 1128   CL 101 09/18/2012 1128   CO2 29 09/18/2012 1128   GLUCOSE 211* 09/18/2012 1128   BUN 13 09/18/2012 1128   CREATININE 0.94 09/18/2012 1128   CREATININE 1.07 06/06/2012 0931   CALCIUM 9.4 09/18/2012 1128   PROT 6.7 07/30/2010 2111   ALBUMIN 3.7 07/30/2010 2111   AST 17 12/12/2011 1400   AST 18 07/30/2010 2111   ALT 10 12/12/2011 1400   ALKPHOS 37 12/12/2011 1400   ALKPHOS 51 07/30/2010 2111   BILITOT 0.5 12/12/2011 1400   BILITOT 0.5 07/30/2010 2111   GFRNONAA 55* 09/18/2012 1128   GFRAA 64* 09/18/2012 1128   COAGS Lab Results  Component Value Date   INR 1.0 09/27/2008    Lipid Panel No results found for this basename: chol, trig, hdl, cholhdl, vldl, ldlcalc   HgbA1C  Lab Results  Component Value Date   HGBA1C 7.7* 09/18/2012   Cardiac Panel (last 3 results) No results found for this basename: CKTOTAL, CKMB, TROPONINI, RELINDX,  in the last 72 hours Urinalysis    Component Value Date/Time   COLORURINE YELLOW 09/18/2012 1456   APPEARANCEUR CLEAR 09/18/2012 1456   LABSPEC 1.011 09/18/2012 1456   PHURINE 8.0 09/18/2012 1456   GLUCOSEU NEGATIVE 09/18/2012 1456   HGBUR NEGATIVE 09/18/2012 1456   BILIRUBINUR NEGATIVE 09/18/2012 1456   KETONESUR NEGATIVE 09/18/2012 1456   PROTEINUR NEGATIVE 09/18/2012 1456   UROBILINOGEN 0.2 09/18/2012 1456   NITRITE NEGATIVE 09/18/2012 1456   LEUKOCYTESUR SMALL* 09/18/2012 1456   Urine Drug Screen  No results found for this basename: labopia, cocainscrnur, labbenz, amphetmu, thcu, labbarb    Alcohol Level No results found for this basename: eth     SIGNIFICANT DIAGNOSTIC STUDIES     History of Present Illness  [name] is an 79 y.o. female who reports going to bed normal on 09/17/2012.Marland Kitchen Awakened on 09/18/2012 and felt her right leg give-way on her. Although she was able to ambulate and go up and down stairs continued to feel weak in her right leg. With no improvement patient presented for evaluation.    Hospital Course - Shortly after admission a CT scan of the head revealed a focal intraparenchymal hemorrhage with a small amount of surrounding edema in the left parietal lobe measuring approximately 1.3 cm. The patient was also noted to have mild cerebral atrophy and chronic small vessel disease.  An MRI performed 09/19/2012 showed a 12 x 15 mm area of hemorrhage with peripheral restricted diffusion involving the left posterior frontal parasagittal motor cortex. There was slight surrounding vasogenic edema. It was felt that the most likely considerations included a primary hypertensive related bleed versus embolic infarction. An  MRA showed no proximal flow reducing lesions or vascular occlusions.  The patient was initially admitted to the neurointensive care unit. She was transferred to the regular neural floor the day after admission. The patient remained stable. She was evaluated by the physical therapists who recommended home health therapies. No followup  occupational therapy was recommended although it was felt that the patient would require 24-hour per day supervision. The speech therapists felt that her cognitive linguistic functions were within normal limits. They did not recommend any further followup.  It was felt that the patient was stable and ready for discharge on 09/20/2012. The patient's daughter was present and she apparently will be the main caregiver. All of their questions were answered.  Patient with vascular risk factors of:   Hypertension  Diabetes  Hyperlipidemia  Patient with continued stroke symptoms of mild left lower extremity weakness. Physical therapy, occupational therapy and speech therapy evaluated patient. They recommend home health physical therapy with 24-hour per day supervision.  Discharge Exam  Blood pressure 131/73, pulse 80, temperature 97.8 F (36.6 C), temperature source Oral, resp. rate 18, height 5\' 6"  (1.676 m), weight 50.6 kg (111 lb 8.8 oz), SpO2 100.00%.   Mental Status:  Alert, oriented, thought content appropriate. Speech fluent without evidence of aphasia. Able to follow 3 step commands without difficulty.  Cranial Nerves:  II: Discs flat bilaterally; Visual fields grossly normal, pupils equal, round, reactive to light and accommodation  III,IV, VI: ptosis not present, extra-ocular motions intact bilaterally  V,VII: smile symmetric, facial light touch sensation normal bilaterally  VIII: hearing normal bilaterally  IX,X: gag reflex present  XI: bilateral shoulder shrug  XII: midline tongue extension  Motor:  Right : Upper extremity 5/5 Left: Upper extremity 5/5   Lower extremity 4/5 Lower extremity 5/5  Tone and bulk:normal tone throughout; no atrophy noted  Sensory: Pinprick and light touch intact throughout, bilaterally  Deep Tendon Reflexes: 2+ and symmetric with absent AJ's bilaterally  Plantars:  Right: downgoing Left: downgoing  Cerebellar:  normal finger-to-nose and normal heel-to-shin test  Gait: Unable to test  CV: pulses palpable throughout    Discharge Diet   Carb Control thin liquids  Discharge Plan    Disposition:  Stable at time of discharge. The patient will live with her daughter who will be her primary caregiver.  No anticoagulation was recommended at this time as this was a hemorrhagic CVA.  Ongoing risk factor control by Primary Care Physician.  Risk factor recommendations:  Patient will need close followup with her primary care physician as well as her endocrinologist Dr. Everardo All for control of her hypertension, hyperlipidemia, and diabetes.  Follow-up HILL,GERALD K, MD in 1 month.  Follow-up with Dr. Delia Heady, [facility] in 2 months.  Greater than 30 minutes were spent preparing discharge.  Signed Delton See PA-C [facility] Pager [phone] 09/20/2012, 3:33 PM  I have personally examined this patient, reviewed pertinent data and developed the plan of care. I agree with above.  Work up complete. Pt is to be d/c home. Pt's daughter at bedside states she will be taking care of pt along with pt's granddaughter(18) who is home for summer. Pt has 14 steps to walk up at home. Family is made aware of pt requiring full assistance with up and down stairs.    Pauletta Browns

## 2012-09-20 NOTE — Progress Notes (Signed)
Stroke Team Progress Note  HISTORY  Chief Complaint: Right leg weakness   HPI: [name] is an 79 y.o. female who reports going to bed normal on 09/17/2012.Marland Kitchen Awakened on 09/18/2012 and felt her right leg give-way on her. Although she was able to ambulate and go up and down stairs continued to feel weak in her right leg. With no improvement patient presented for evaluation.   Date last known well: 09/18/2012  Time last known well: Time: 01:00  tPA Given: No: ICH  SUBJECTIVE The patient's daughters present in the room this morning. The patient is lying in bed with no complaints.   OBJECTIVE Most recent Vital Signs: Filed Vitals:   09/19/12 2244 09/20/12 0145 09/20/12 0627 09/20/12 0959  BP: 130/68 143/71 150/81 131/73  Pulse: 72 81 77 80  Temp: 98.2 F (36.8 C) 98.4 F (36.9 C) 98.3 F (36.8 C) 97.8 F (36.6 C)  TempSrc: Oral Oral Oral Oral  Resp: 18 18 18 18   Height:      Weight:      SpO2: 100% 99% 99% 100%   CBG (last 3)   Recent Labs  09/19/12 2242 09/20/12 0636 09/20/12 1118  GLUCAP 96 105* 98    IV Fluid Intake:     MEDICATIONS  . acarbose  25 mg Oral TID WC  . ALPRAZolam  0.5 mg Oral TID  . atorvastatin  20 mg Oral q1800  . bromocriptine  2.5 mg Oral QHS  . cholecalciferol  2,000 Units Oral Daily  . imipramine  40 mg Oral Daily  . insulin aspart  0-5 Units Subcutaneous QHS  . insulin aspart  0-9 Units Subcutaneous TID WC  . linagliptin  5 mg Oral Daily  . losartan  50 mg Oral Daily  . metFORMIN  500 mg Oral BID WC  . pantoprazole  40 mg Oral QHS  . polyethylene glycol  17 g Oral Daily  . senna-docusate  1 tablet Oral BID  . verapamil  120 mg Oral Daily   PRN:  acetaminophen, acetaminophen, labetalol  Diet:  Carb Control thins liquids Activity:  Bedrest DVT Prophylaxis:  scd  CLINICALLY SIGNIFICANT STUDIES Basic Metabolic Panel:   Recent Labs Lab 09/18/12 1128  NA 138  K 4.3  CL 101  CO2 29  GLUCOSE 211*  BUN 13  CREATININE 0.94   CALCIUM 9.4   Liver Function Tests: No results found for this basename: AST, ALT, ALKPHOS, BILITOT, PROT, ALBUMIN,  in the last 168 hours CBC:   Recent Labs Lab 09/18/12 1128  WBC 6.6  NEUTROABS 3.8  HGB 11.5*  HCT 34.6*  MCV 82.6  PLT 219   Coagulation: No results found for this basename: LABPROT, INR,  in the last 168 hours Cardiac Enzymes: No results found for this basename: CKTOTAL, CKMB, CKMBINDEX, TROPONINI,  in the last 168 hours Urinalysis:   Recent Labs Lab 09/18/12 1456  COLORURINE YELLOW  LABSPEC 1.011  PHURINE 8.0  GLUCOSEU NEGATIVE  HGBUR NEGATIVE  BILIRUBINUR NEGATIVE  KETONESUR NEGATIVE  PROTEINUR NEGATIVE  UROBILINOGEN 0.2  NITRITE NEGATIVE  LEUKOCYTESUR SMALL*   Lipid Panel No results found for this basename: chol,  trig,  hdl,  cholhdl,  vldl,  ldlcalc   HgbA1C  Lab Results  Component Value Date   HGBA1C 7.7* 09/18/2012    Urine Drug Screen:   No results found for this basename: labopia,  cocainscrnur,  labbenz,  amphetmu,  thcu,  labbarb    Alcohol Level: No results found for  this basename: ETH,  in the last 168 hours  Ct Head (brain) Wo Contrast 09/18/2012  1.  There is focal intraparenchymal hemorrhage with small amount of surrounding edema in the left parietal lobe high convexity measures 1.3 cm.  Hemorrhagic infarct cannot be excluded.  Clinical correlation is necessary.  Further evaluation with MRI is recommended. 2.  Mild cerebral atrophy.  Periventricular white matter decreased attenuation is probable due to chronic small vessel ischemic changes.       MRI of the brain   09/19/2012 12 x 15 mm area of hemorrhage with peripheral restricted diffusion  involving the left posterior frontal parasagittal motor cortex.  Slight surrounding vasogenic edema. Most likely considerations  include primary hypertensive related bleed versus embolic  infarction. Correlate clinically.  Sequelae of longstanding hypertensive cerebrovascular disease with   chronic microvascular ischemic change and numerous remote  microbleeds.   MRA of the brain   09/19/2012 No proximal flow reducing lesion or vascular occlusion is evident   2D Echocardiogram  not ordered secondary to hemorrhagic CVA  Carotid Doppler  not ordered secondary to hemorrhagic CVA  CXR   09/19/2012 No evidence of acute cardiopulmonary disease.   EKG  normal sinus rhythm.   Therapy Recommendations   Physical Exam   Mental Status:  Alert, oriented, thought content appropriate. Speech fluent without evidence of aphasia. Able to follow 3 step commands without difficulty.  Cranial Nerves:  II: Discs flat bilaterally; Visual fields grossly normal, pupils equal, round, reactive to light and accommodation  III,IV, VI: ptosis not present, extra-ocular motions intact bilaterally  V,VII: smile symmetric, facial light touch sensation normal bilaterally  VIII: hearing normal bilaterally  IX,X: gag reflex present  XI: bilateral shoulder shrug  XII: midline tongue extension  Motor:  Right : Upper extremity 5/5 Left: Upper extremity 5/5  Lower extremity 4/5 Lower extremity 5/5  Tone and bulk:normal tone throughout; no atrophy noted  Sensory: Pinprick and light touch intact throughout, bilaterally  Deep Tendon Reflexes: 2+ and symmetric with absent AJ's bilaterally  Plantars:  Right: downgoing Left: downgoing  Cerebellar:  normal finger-to-nose and normal heel-to-shin test  Gait: Unable to test  CV: pulses palpable throughout    ASSESSMENT [name] is a 79 y.o. female presenting with right hemiparesis. Imaging confirms a focal intraparenchymal hemorrhage with small amount of surrounding edema in the left parietal lobes, felt to be hypertensive in nature.  On no antithrombotics prior to admission. Now on no antithrombotics for secondary stroke prevention. Patient with resultant right hemiparesis. Work up underway.   Hypertension  Hyperlipidemia  Diabetes  mellitus  Long term medication use  Hospital day # 2  TREATMENT/PLAN  Continue no antithrombotics due to hemorrhage for secondary stroke prevention.  Home health therapies planned  Plan discussed with patient and family  Plan - discharge today   Delton See PA-C [facility] Pager [phone] 09/20/2012, 11:57 AM  I have personally obtained a history, examined the patient, evaluated imaging results, and formulated the assessment and plan of care. I agree with the above.

## 2012-10-01 ENCOUNTER — Emergency Department (HOSPITAL_COMMUNITY): Payer: Medicare Other

## 2012-10-01 ENCOUNTER — Encounter (HOSPITAL_COMMUNITY): Payer: Self-pay | Admitting: *Deleted

## 2012-10-01 ENCOUNTER — Emergency Department (HOSPITAL_COMMUNITY)
Admission: EM | Admit: 2012-10-01 | Discharge: 2012-10-01 | Disposition: A | Discharge status: Home / Self Care | Payer: Medicare Other | Attending: Emergency Medicine | Admitting: Emergency Medicine

## 2012-10-01 DIAGNOSIS — E119 Type 2 diabetes mellitus without complications: Secondary | ICD-10-CM | POA: Insufficient documentation

## 2012-10-01 DIAGNOSIS — R0989 Other specified symptoms and signs involving the circulatory and respiratory systems: Secondary | ICD-10-CM | POA: Insufficient documentation

## 2012-10-01 DIAGNOSIS — E7800 Pure hypercholesterolemia, unspecified: Secondary | ICD-10-CM | POA: Insufficient documentation

## 2012-10-01 DIAGNOSIS — I10 Essential (primary) hypertension: Secondary | ICD-10-CM | POA: Insufficient documentation

## 2012-10-01 DIAGNOSIS — R0609 Other forms of dyspnea: Secondary | ICD-10-CM | POA: Insufficient documentation

## 2012-10-01 DIAGNOSIS — Z8739 Personal history of other diseases of the musculoskeletal system and connective tissue: Secondary | ICD-10-CM | POA: Insufficient documentation

## 2012-10-01 DIAGNOSIS — Z8673 Personal history of transient ischemic attack (TIA), and cerebral infarction without residual deficits: Secondary | ICD-10-CM | POA: Insufficient documentation

## 2012-10-01 DIAGNOSIS — R6889 Other general symptoms and signs: Secondary | ICD-10-CM | POA: Insufficient documentation

## 2012-10-01 DIAGNOSIS — Z8679 Personal history of other diseases of the circulatory system: Secondary | ICD-10-CM | POA: Insufficient documentation

## 2012-10-01 DIAGNOSIS — R51 Headache: Secondary | ICD-10-CM | POA: Insufficient documentation

## 2012-10-01 DIAGNOSIS — Z79899 Other long term (current) drug therapy: Secondary | ICD-10-CM | POA: Insufficient documentation

## 2012-10-01 DIAGNOSIS — R1310 Dysphagia, unspecified: Secondary | ICD-10-CM | POA: Insufficient documentation

## 2012-10-01 DIAGNOSIS — K219 Gastro-esophageal reflux disease without esophagitis: Secondary | ICD-10-CM | POA: Insufficient documentation

## 2012-10-01 DIAGNOSIS — Z8719 Personal history of other diseases of the digestive system: Secondary | ICD-10-CM | POA: Insufficient documentation

## 2012-10-01 LAB — CBC WITH DIFFERENTIAL/PLATELET
Basophils Absolute: 0 10*3/uL (ref 0.0–0.1)
Eosinophils Relative: 2 % (ref 0–5)
HCT: 34.3 % — ABNORMAL LOW (ref 36.0–46.0)
Hemoglobin: 11.7 g/dL — ABNORMAL LOW (ref 12.0–15.0)
Lymphocytes Relative: 22 % (ref 12–46)
Lymphs Abs: 1.6 10*3/uL (ref 0.7–4.0)
MCV: 83.3 fL (ref 78.0–100.0)
Monocytes Absolute: 0.9 10*3/uL (ref 0.1–1.0)
Monocytes Relative: 12 % (ref 3–12)
Neutro Abs: 4.6 10*3/uL (ref 1.7–7.7)
RBC: 4.12 MIL/uL (ref 3.87–5.11)
RDW: 14.6 % (ref 11.5–15.5)
WBC: 7.2 10*3/uL (ref 4.0–10.5)

## 2012-10-01 LAB — COMPREHENSIVE METABOLIC PANEL
AST: 16 U/L (ref 0–37)
CO2: 26 mEq/L (ref 19–32)
Chloride: 98 mEq/L (ref 96–112)
Creatinine, Ser: 0.91 mg/dL (ref 0.50–1.10)
GFR calc Af Amer: 67 mL/min — ABNORMAL LOW (ref >90)
GFR calc non Af Amer: 58 mL/min — ABNORMAL LOW (ref >90)
Glucose, Bld: 144 mg/dL — ABNORMAL HIGH (ref 70–99)
Total Bilirubin: 0.3 mg/dL (ref 0.3–1.2)

## 2012-10-01 MED ORDER — SUCRALFATE 1 GM/10ML PO SUSP: 1.0000 g | Freq: Four times a day (QID) | ORAL | Status: DC

## 2012-10-01 MED ORDER — GI COCKTAIL ~~LOC~~
30.0000 mL | Freq: Once | ORAL | Status: AC
  Administered 2012-10-01: 30 mL via ORAL
  Filled 2012-10-01: qty 30

## 2012-10-01 NOTE — ED Notes (Signed)
Per EMS- pt was eating peaches when she began having difficulty swallowing. Pt states that this is not new and that she had an episode approx 2 weeks ago. Pt has hx of stroke and states that this has been going on since before her stroke. Pt also reports taking a suppository and states "it wouldn't come out so i put some toilet paper up there to try to get it out".

## 2012-10-01 NOTE — ED Provider Notes (Signed)
History    CSN: [id number] Arrival date & time 10/01/12  1719  First MD Initiated Contact with Patient 10/01/12 1727     No chief complaint on file.  (Consider location/radiation/quality/duration/timing/severity/associated sxs/prior Treatment) HPI Comments: Pt w/ PMHx of recent hemorrhagic CVA, GERD and known Schatzki ring now w/ dysphagia. States acute onset severe right sided HA - frontal - radiating to temporal. Sudden onset, lasted several minutes and resolved spontaneously. Pt subsequently was eating a pear when she felt FB sensation on left sided of neck. "felt like pear was stuck", sx were a/w dyspnea. Pt subsequently called EMS, she then proceeded to place enema/suppository - thinking it would relieve her dyspnea. Has not had BM yet but dysphagia resolved. On arrival to ED Pt now denies any complaints. No chest or dyspnea. No HA.   Patient is a 79 y.o. female presenting with general illness. The history is provided by the patient. No language interpreter was used.  Illness Location:  GI Quality:  FB sensation in throat Severity:  Severe Onset quality:  Sudden Timing:  Intermittent Progression:  Resolved Chronicity:  New Associated symptoms: headaches   Associated symptoms: no abdominal pain, no chest pain, no congestion, no cough, no diarrhea, no fever, no nausea, no rash, no shortness of breath, no sore throat and no vomiting    Past Medical History  Diagnosis Date  . Memory loss 06/13/2009  . Irritable bowel syndrome 06/13/2009  . MIGRAINE HEADACHE 06/13/2009  . OSTEOARTHRITIS 06/13/2009  . HYPERTENSION 06/13/2009  . GERD Sept 2012    [facility], pH and manometry.. hiatal hernia present, resting pressure of LES hypotensive. normal peristalsis esophageal contractions. DeMeester score 0.2. Excellent control of GERD on PPI.   Marland Kitchen HYPERCHOLESTEROLEMIA 06/13/2009  . DM 06/13/2009   Past Surgical History  Procedure Laterality Date  . Appendectomy      40+ years ago   . Esophagogastroduodenoscopy  03/30/2003     WUJ:[id number] ring otherwise normal esophagus/  Small hiatal hernia/ Normal stomach/ Status post dilation of the ring in an unusual manner  . Colonoscopy  03/30/2003    Poor prep precluded completion of the examination.  . Colonoscopy  June 2006    Dr. Elpidio Anis: normal  . 24 hour ph study  Sept 2012    DeMeester 0.2  . Esophageal manometry      hypotensive LES   Family History  Problem Relation Age of Onset  . Diabetes Father   . Diabetes Sister   . Colon cancer Brother     deceased at age  79, diagnosed age late 15s.    History  Substance Use Topics  . Smoking status: Never Smoker   . Smokeless tobacco: Not on file  . Alcohol Use: No   OB History   Grav Para Term Preterm Abortions TAB SAB Ect Mult Living                 Review of Systems  Constitutional: Negative for fever and chills.  HENT: Positive for trouble swallowing. Negative for congestion and sore throat.   Respiratory: Negative for cough and shortness of breath.   Cardiovascular: Negative for chest pain and leg swelling.  Gastrointestinal: Negative for nausea, vomiting, abdominal pain, diarrhea and constipation.  Genitourinary: Negative for dysuria and frequency.  Skin: Negative for color change and rash.  Neurological: Positive for headaches. Negative for dizziness.  Psychiatric/Behavioral: Negative for confusion and agitation.  All other systems reviewed and are negative.    Allergies  Review of patient's allergies indicates no known allergies.  Home Medications   Current Outpatient Rx  Name  Route  Sig  Dispense  Refill  . acarbose (PRECOSE) 25 MG tablet   Oral   Take 1 tablet (25 mg total) by mouth 3 (three) times daily with meals.   90 tablet   11   . acetaminophen (TYLENOL) 500 MG tablet   Oral   Take 500 mg by mouth every 6 (six) hours as needed for pain.         Marland Kitchen ALPRAZolam (XANAX) 0.5 MG tablet   Oral   Take 0.5 mg by mouth 3 (three)  times daily.         . bromocriptine (PARLODEL) 2.5 MG tablet   Oral   Take 1 tablet (2.5 mg total) by mouth at bedtime.   30 tablet   11   . Cholecalciferol (VITAMIN D) 2000 UNITS CAPS   Oral   Take 1 capsule by mouth daily.           Marland Kitchen esomeprazole (NEXIUM) 40 MG capsule   Oral   Take 1 capsule (40 mg total) by mouth 2 times daily at 12 noon and 4 pm.   60 capsule   5     Has tried and failed Prilosec, Protonix.   Marland Kitchen imipramine (TOFRANIL) 10 MG tablet   Oral   Take 40 mg by mouth at bedtime.          Marland Kitchen linagliptin (TRADJENTA) 5 MG TABS tablet   Oral   Take 5 mg by mouth daily.         Marland Kitchen losartan (COZAAR) 50 MG tablet   Oral   Take 1 tablet (50 mg total) by mouth daily.   30 tablet   3   . metFORMIN (GLUCOPHAGE) 500 MG tablet   Oral   Take 1 tablet (500 mg total) by mouth 2 (two) times daily with a meal.   60 tablet   11     Pt was originally prescribed to take 1000 mg bid,  ...   . polyethylene glycol powder (GLYCOLAX/MIRALAX) powder   Oral   Take 17 g by mouth daily. For constipation. Hold if loose stools.   255 g   3   . simvastatin (ZOCOR) 40 MG tablet   Oral   Take 1 tablet (40 mg total) by mouth at bedtime.   30 tablet   11   . verapamil (VERELAN PM) 120 MG 24 hr capsule   Oral   Take 120 mg by mouth daily.           BP 154/79  Pulse 86  Temp(Src) 97.8 F (36.6 C) (Oral)  Resp 18  SpO2 99% Physical Exam  Vitals reviewed. Constitutional: She is oriented to person, place, and time. She appears well-developed and well-nourished. No distress.  HENT:  Head: Normocephalic and atraumatic.  Mouth/Throat: Uvula is midline, oropharynx is clear and moist and mucous membranes are normal.  Eyes: EOM are normal. Pupils are equal, round, and reactive to light.  Neck: Normal range of motion. Neck supple. No JVD present. No tracheal tenderness and no muscular tenderness present. No rigidity. No tracheal deviation present. No mass and no  thyromegaly present.  Cardiovascular: Normal rate, regular rhythm and normal heart sounds.   Pulmonary/Chest: Effort normal and breath sounds normal. No respiratory distress.  Abdominal: Soft. She exhibits no distension.  Musculoskeletal: Normal range of motion. She exhibits no edema.  Neurological: She is  alert and oriented to person, place, and time. She has normal strength. No sensory deficit. GCS eye subscore is 4. GCS verbal subscore is 5. GCS motor subscore is 6.  Skin: Skin is warm and dry.  Psychiatric: She has a normal mood and affect. Her behavior is normal.    ED Course  Procedures (including critical care time) Labs Reviewed  CBC WITH DIFFERENTIAL  COMPREHENSIVE METABOLIC PANEL   Results for orders placed during the hospital encounter of 10/01/12  CBC WITH DIFFERENTIAL      Result Value Range   WBC 7.2  4.0 - 10.5 K/uL   RBC 4.12  3.87 - 5.11 MIL/uL   Hemoglobin 11.7 (*) 12.0 - 15.0 g/dL   HCT 09.8 (*) 11.9 - 14.7 %   MCV 83.3  78.0 - 100.0 fL   MCH 28.4  26.0 - 34.0 pg   MCHC 34.1  30.0 - 36.0 g/dL   RDW 82.9  56.2 - 13.0 %   Platelets 198  150 - 400 K/uL   Neutrophils Relative % 64  43 - 77 %   Neutro Abs 4.6  1.7 - 7.7 K/uL   Lymphocytes Relative 22  12 - 46 %   Lymphs Abs 1.6  0.7 - 4.0 K/uL   Monocytes Relative 12  3 - 12 %   Monocytes Absolute 0.9  0.1 - 1.0 K/uL   Eosinophils Relative 2  0 - 5 %   Eosinophils Absolute 0.1  0.0 - 0.7 K/uL   Basophils Relative 0  0 - 1 %   Basophils Absolute 0.0  0.0 - 0.1 K/uL  COMPREHENSIVE METABOLIC PANEL      Result Value Range   Sodium 134 (*) 135 - 145 mEq/L   Potassium 4.2  3.5 - 5.1 mEq/L   Chloride 98  96 - 112 mEq/L   CO2 26  19 - 32 mEq/L   Glucose, Bld 144 (*) 70 - 99 mg/dL   BUN 19  6 - 23 mg/dL   Creatinine, Ser 8.65  0.50 - 1.10 mg/dL   Calcium 9.7  8.4 - 78.4 mg/dL   Total Protein 6.6  6.0 - 8.3 g/dL   Albumin 3.3 (*) 3.5 - 5.2 g/dL   AST 16  0 - 37 U/L   ALT 9  0 - 35 U/L   Alkaline Phosphatase 48   39 - 117 U/L   Total Bilirubin 0.3  0.3 - 1.2 mg/dL   GFR calc non Af Amer 58 (*) >90 mL/min   GFR calc Af Amer 67 (*) >90 mL/min    CT Head Wo Contrast (Final result)  Result time: 10/01/12 20:15:56    Final result by Rad Results In Interface (10/01/12 20:15:56)    Narrative:   *RADIOLOGY REPORT*  Clinical Data: Headache and dysphagia  CT HEAD WITHOUT CONTRAST  Technique: Contiguous axial images were obtained from the base of the skull through the vertex without contrast.  Comparison: 09/19/2012  Findings: Interval decrease in left frontal parasagittal motor cortex hemorrhage. There is a focal area of low attenuation in this area is identified measuring 1.82 cm.  There is diffuse patchy low density throughout the subcortical and periventricular white matter consistent with chronic small vessel ischemic change.  There is no evidence for acute brain infarct, hemorrhage or mass.  The paranasal sinuses and mastoid air cells are clear. The skull is intact.  IMPRESSION:  1. Resolving left posterior frontal parenchymal hemorrhage with residual or low attenuation edema. 2. No  acute/new intracranial abnormalities identified.   Original Report Authenticated By: Signa Kell, M.D.             DG Chest 2 View (Final result)  Result time: 10/01/12 20:15:56    Final result by Rad Results In Interface (10/01/12 20:15:56)    Narrative:   *RADIOLOGY REPORT*  Clinical Data: Dysphasia.  CHEST - 2 VIEW  Comparison: 09/19/2012.  Findings: Lungs clear and well aerated. No effusion or pneumothorax. Normal heart size. Tortuous thoracic aorta. No evident cause of dysphagia. No acute osseous findings.  IMPRESSION: Negative chest for age.   Original Report Authenticated By: Tiburcio Pea     Date: 10/01/2012  Rate: 81  Rhythm: normal sinus rhythm  QRS Axis: left  Intervals: normal  ST/T Wave abnormalities: normal  Conduction Disutrbances:none  Narrative  Interpretation:   Old EKG Reviewed: unchanged   No results found. No diagnosis found.  MDM  Exam as above NAD, handling secretions - no indication for emergent EGD. Doubt ACS, boerhaave, ptx, tamponade or PE. CT head w/ resolving hemorrhage, no acute findings. At this time doubt CVA/TIA/SAH. pts prior CVA only resulted in transient RLE weakness that has resolved. No focal neuro deficit on my exam. CXR - NACPF - no boerhaave. ECG w/out acute ischemia. CBC and CMP unremarkable. Pt able to eat and drink in ED w/out difficulty. Passed bedside swallow eval. No mass lesion noted on exam of neck. Given GI cocktail. At this time deemed stable for d/c home. Recommend close fup w/ pcp and GI for repeat eval and likely need for EGD. Given return precautions. D/c in good condition.   I have personally reviewed labs and imaging and considered in my MDM. Case d/w Dr Hyacinth Meeker.   1. Dysphagia    Discharge Medication List as of 10/01/2012  8:26 PM    START taking these medications   Details  sucralfate (CARAFATE) 1 GM/10ML suspension Take 10 mLs (1 g total) by mouth 4 (four) times daily., Starting 10/01/2012, Until Discontinued, Print       Mirna Mires, MD [address] [place] [address] [phone]  Schedule an appointment as soon as possible for a visit will need apt w/ gastroenterology for recurrent dysphagia and food lodging in esophagus     Audelia Hives, MD 10/02/12 0000

## 2012-10-01 NOTE — ED Notes (Signed)
Pt states that she was eating pears and she starting havign difficulty swallowing. Pt denies HA, weakness, slurred speech at time of diffifulty swallowing. Pt states she started to get SOB, but it passed once the feeling that she could not swallow passed. Pt states that she also has IBS and was having difficulty going to the bathroom so she attempted a suppository. Pt state that she had a small bowel movement upon arrival to ED but has since only been passing gas. Pt feels bloated, but no severe abdominal pain.

## 2012-10-04 NOTE — ED Provider Notes (Signed)
I saw and evaluated the patient, reviewed the resident's note and I agree with the findings and plan.  I personally interpreted the EKG as well as the resident and agree with the interpretation on the resident's chart.   Patient with recent hemorrhagic stroke, presents with this dysphagia after swallowing a piece of food which he felt was hung up in her throat. She felt slightly dyspneic with this, she called EMS but her symptoms passed. She no longer has any feeling of dyspnea, on exam she is well appearing, soft abdomen, clear heart and lung sounds, ability to swallow preserved without any aspiration, chest x-ray unremarkable, CT scan of the head with resolving symptoms and no new finding bleeding.   I personally have interpreted the EKG and agree with the resident's interpretation.  Vida Roller, MD 10/04/12 [phone]

## 2012-10-13 DIAGNOSIS — F068 Other specified mental disorders due to known physiological condition: Secondary | ICD-10-CM

## 2012-10-31 ENCOUNTER — Telehealth: Payer: Self-pay | Admitting: Endocrinology

## 2012-10-31 MED ORDER — LINAGLIPTIN 5 MG PO TABS: 5.0000 mg | ORAL_TABLET | Freq: Every day | ORAL | Status: AC

## 2012-11-22 ENCOUNTER — Other Ambulatory Visit: Payer: Self-pay | Admitting: Endocrinology

## 2012-11-24 ENCOUNTER — Other Ambulatory Visit: Payer: Self-pay | Admitting: *Deleted

## 2012-11-24 NOTE — Telephone Encounter (Signed)
Encounter opened in error

## 2012-12-03 ENCOUNTER — Other Ambulatory Visit: Payer: Self-pay | Admitting: Endocrinology

## 2012-12-06 ENCOUNTER — Other Ambulatory Visit: Payer: Self-pay | Admitting: Endocrinology

## 2012-12-08 ENCOUNTER — Other Ambulatory Visit: Payer: Self-pay | Admitting: *Deleted

## 2012-12-08 MED ORDER — LOSARTAN POTASSIUM 50 MG PO TABS: 50.0000 mg | ORAL_TABLET | Freq: Every day | ORAL | Status: AC

## 2012-12-15 ENCOUNTER — Encounter: Payer: Self-pay | Admitting: Internal Medicine

## 2012-12-17 ENCOUNTER — Emergency Department (HOSPITAL_COMMUNITY): Payer: Medicare Other

## 2012-12-17 ENCOUNTER — Observation Stay (HOSPITAL_COMMUNITY): Payer: Medicare Other

## 2012-12-17 ENCOUNTER — Inpatient Hospital Stay (HOSPITAL_COMMUNITY)
Admission: EM | Admit: 2012-12-17 | Discharge: 2012-12-24 | DRG: 124 | Disposition: A | Discharge status: Skilled Nursing Facility | Payer: Medicare Other | Attending: Internal Medicine | Admitting: Internal Medicine

## 2012-12-17 ENCOUNTER — Encounter (HOSPITAL_COMMUNITY): Payer: Self-pay | Admitting: *Deleted

## 2012-12-17 DIAGNOSIS — R404 Transient alteration of awareness: Secondary | ICD-10-CM | POA: Diagnosis present

## 2012-12-17 DIAGNOSIS — G459 Transient cerebral ischemic attack, unspecified: Secondary | ICD-10-CM

## 2012-12-17 DIAGNOSIS — F0390 Unspecified dementia without behavioral disturbance: Secondary | ICD-10-CM | POA: Diagnosis present

## 2012-12-17 DIAGNOSIS — R443 Hallucinations, unspecified: Secondary | ICD-10-CM

## 2012-12-17 DIAGNOSIS — F0391 Unspecified dementia with behavioral disturbance: Secondary | ICD-10-CM

## 2012-12-17 DIAGNOSIS — E119 Type 2 diabetes mellitus without complications: Secondary | ICD-10-CM | POA: Diagnosis present

## 2012-12-17 DIAGNOSIS — E440 Moderate protein-calorie malnutrition: Secondary | ICD-10-CM | POA: Insufficient documentation

## 2012-12-17 DIAGNOSIS — M1990 Unspecified osteoarthritis, unspecified site: Secondary | ICD-10-CM | POA: Diagnosis present

## 2012-12-17 DIAGNOSIS — I6350 Cerebral infarction due to unspecified occlusion or stenosis of unspecified cerebral artery: Secondary | ICD-10-CM | POA: Diagnosis present

## 2012-12-17 DIAGNOSIS — Z8673 Personal history of transient ischemic attack (TIA), and cerebral infarction without residual deficits: Secondary | ICD-10-CM

## 2012-12-17 DIAGNOSIS — K219 Gastro-esophageal reflux disease without esophagitis: Secondary | ICD-10-CM | POA: Diagnosis present

## 2012-12-17 DIAGNOSIS — K589 Irritable bowel syndrome without diarrhea: Secondary | ICD-10-CM | POA: Diagnosis present

## 2012-12-17 DIAGNOSIS — E785 Hyperlipidemia, unspecified: Secondary | ICD-10-CM | POA: Diagnosis present

## 2012-12-17 DIAGNOSIS — R413 Other amnesia: Secondary | ICD-10-CM

## 2012-12-17 DIAGNOSIS — R410 Disorientation, unspecified: Secondary | ICD-10-CM

## 2012-12-17 DIAGNOSIS — H5316 Psychophysical visual disturbances: Principal | ICD-10-CM | POA: Diagnosis present

## 2012-12-17 DIAGNOSIS — E7800 Pure hypercholesterolemia, unspecified: Secondary | ICD-10-CM | POA: Diagnosis present

## 2012-12-17 DIAGNOSIS — I10 Essential (primary) hypertension: Secondary | ICD-10-CM | POA: Diagnosis present

## 2012-12-17 HISTORY — DX: Transient cerebral ischemic attack, unspecified: G45.9

## 2012-12-17 LAB — URINALYSIS, ROUTINE W REFLEX MICROSCOPIC
Bilirubin Urine: NEGATIVE
Glucose, UA: NEGATIVE mg/dL
Hgb urine dipstick: NEGATIVE
Ketones, ur: NEGATIVE mg/dL
Nitrite: NEGATIVE
Protein, ur: NEGATIVE mg/dL
Specific Gravity, Urine: 1.022 (ref 1.005–1.030)
Urobilinogen, UA: 0.2 mg/dL (ref 0.0–1.0)
pH: 6 (ref 5.0–8.0)

## 2012-12-17 LAB — CBC
HCT: 33.2 % — ABNORMAL LOW (ref 36.0–46.0)
HCT: 33.2 % — ABNORMAL LOW (ref 36.0–46.0)
Hemoglobin: 11 g/dL — ABNORMAL LOW (ref 12.0–15.0)
Hemoglobin: 11.5 g/dL — ABNORMAL LOW (ref 12.0–15.0)
MCH: 27.2 pg (ref 26.0–34.0)
MCHC: 33.1 g/dL (ref 30.0–36.0)
MCHC: 34.6 g/dL (ref 30.0–36.0)
MCV: 82 fL (ref 78.0–100.0)
MCV: 81.2 fL (ref 78.0–100.0)
Platelets: 203 10*3/uL (ref 150–400)
RBC: 4.05 MIL/uL (ref 3.87–5.11)
RBC: 4.09 MIL/uL (ref 3.87–5.11)
RDW: 16.4 % — ABNORMAL HIGH (ref 11.5–15.5)
RDW: 16.2 % — ABNORMAL HIGH (ref 11.5–15.5)
WBC: 6.1 10*3/uL (ref 4.0–10.5)
WBC: 5.7 10*3/uL (ref 4.0–10.5)

## 2012-12-17 LAB — URINE MICROSCOPIC-ADD ON

## 2012-12-17 LAB — COMPREHENSIVE METABOLIC PANEL
ALT: 15 U/L (ref 0–35)
AST: 18 U/L (ref 0–37)
Albumin: 3.8 g/dL (ref 3.5–5.2)
Alkaline Phosphatase: 43 U/L (ref 39–117)
BUN: 22 mg/dL (ref 6–23)
CO2: 22 mEq/L (ref 19–32)
Calcium: 9.5 mg/dL (ref 8.4–10.5)
Chloride: 103 mEq/L (ref 96–112)
Creatinine, Ser: 0.93 mg/dL (ref 0.50–1.10)
GFR calc Af Amer: 65 mL/min — ABNORMAL LOW (ref >90)
GFR calc non Af Amer: 56 mL/min — ABNORMAL LOW (ref >90)
Glucose, Bld: 129 mg/dL — ABNORMAL HIGH (ref 70–99)
Potassium: 4.2 mEq/L (ref 3.5–5.1)
Sodium: 136 mEq/L (ref 135–145)
Total Bilirubin: 0.6 mg/dL (ref 0.3–1.2)
Total Protein: 7.3 g/dL (ref 6.0–8.3)

## 2012-12-17 LAB — GLUCOSE, CAPILLARY: Glucose-Capillary: 104 mg/dL — ABNORMAL HIGH (ref 70–99)

## 2012-12-17 LAB — CREATININE, SERUM
GFR calc Af Amer: 78 mL/min — ABNORMAL LOW (ref >90)
GFR calc non Af Amer: 67 mL/min — ABNORMAL LOW (ref >90)

## 2012-12-17 MED ORDER — ATORVASTATIN CALCIUM 20 MG PO TABS
20.0000 mg | ORAL_TABLET | Freq: Every day | ORAL | Status: DC
  Administered 2012-12-17 – 2012-12-23 (×6): 20 mg via ORAL
  Filled 2012-12-17 (×9): qty 1

## 2012-12-17 MED ORDER — ACARBOSE 25 MG PO TABS
25.0000 mg | ORAL_TABLET | Freq: Three times a day (TID) | ORAL | Status: DC
  Administered 2012-12-18 – 2012-12-24 (×20): 25 mg via ORAL
  Filled 2012-12-17 (×23): qty 1

## 2012-12-17 MED ORDER — ALPRAZOLAM 0.5 MG PO TABS
0.5000 mg | ORAL_TABLET | Freq: Three times a day (TID) | ORAL | Status: DC
  Administered 2012-12-17 – 2012-12-18 (×2): 0.5 mg via ORAL
  Filled 2012-12-17 (×2): qty 1

## 2012-12-17 MED ORDER — SUCRALFATE 1 GM/10ML PO SUSP
1.0000 g | Freq: Four times a day (QID) | ORAL | Status: DC
  Administered 2012-12-17 – 2012-12-24 (×25): 1 g via ORAL
  Filled 2012-12-17 (×31): qty 10

## 2012-12-17 MED ORDER — LORAZEPAM 1 MG PO TABS
1.0000 mg | ORAL_TABLET | Freq: Once | ORAL | Status: AC
  Administered 2012-12-17: 1 mg via ORAL
  Filled 2012-12-17: qty 1

## 2012-12-17 MED ORDER — HEPARIN SODIUM (PORCINE) 5000 UNIT/ML IJ SOLN
5000.0000 [IU] | Freq: Three times a day (TID) | INTRAMUSCULAR | Status: DC
  Administered 2012-12-17 – 2012-12-24 (×12): 5000 [IU] via SUBCUTANEOUS
  Filled 2012-12-17 (×24): qty 1

## 2012-12-17 MED ORDER — LINAGLIPTIN 5 MG PO TABS
5.0000 mg | ORAL_TABLET | Freq: Every day | ORAL | Status: DC
  Filled 2012-12-17: qty 1

## 2012-12-17 MED ORDER — VERAPAMIL HCL ER 120 MG PO TBCR
120.0000 mg | EXTENDED_RELEASE_TABLET | Freq: Every day | ORAL | Status: DC
  Administered 2012-12-18 – 2012-12-24 (×7): 120 mg via ORAL
  Filled 2012-12-17 (×7): qty 1

## 2012-12-17 MED ORDER — SODIUM CHLORIDE 0.9 % IJ SOLN
3.0000 mL | Freq: Two times a day (BID) | INTRAMUSCULAR | Status: DC
  Administered 2012-12-18 – 2012-12-24 (×8): 3 mL via INTRAVENOUS

## 2012-12-17 MED ORDER — POLYETHYLENE GLYCOL 3350 17 GM/SCOOP PO POWD
17.0000 g | Freq: Every day | ORAL | Status: DC
  Filled 2012-12-17: qty 255

## 2012-12-17 MED ORDER — METFORMIN HCL 500 MG PO TABS
500.0000 mg | ORAL_TABLET | Freq: Two times a day (BID) | ORAL | Status: DC
  Administered 2012-12-18: 500 mg via ORAL
  Filled 2012-12-17 (×3): qty 1

## 2012-12-17 MED ORDER — SODIUM CHLORIDE 0.9 % IV SOLN: 250.0000 mL | INTRAVENOUS | Status: DC | PRN

## 2012-12-17 MED ORDER — VERAPAMIL HCL ER 120 MG PO CP24: 120.0000 mg | ORAL_CAPSULE | Freq: Every day | ORAL | Status: DC

## 2012-12-17 MED ORDER — HALOPERIDOL LACTATE 5 MG/ML IJ SOLN
3.0000 mg | Freq: Once | INTRAMUSCULAR | Status: AC
  Administered 2012-12-17: 3 mg via INTRAMUSCULAR
  Filled 2012-12-17: qty 1

## 2012-12-17 MED ORDER — LOSARTAN POTASSIUM 50 MG PO TABS
50.0000 mg | ORAL_TABLET | Freq: Every day | ORAL | Status: DC
Start: 2012-12-17 — End: 2012-12-24
  Administered 2012-12-18 – 2012-12-24 (×7): 50 mg via ORAL
  Filled 2012-12-17 (×7): qty 1

## 2012-12-17 MED ORDER — POLYETHYLENE GLYCOL 3350 17 G PO PACK
17.0000 g | PACK | Freq: Every day | ORAL | Status: DC
  Administered 2012-12-18 – 2012-12-24 (×7): 17 g via ORAL
  Filled 2012-12-17 (×7): qty 1

## 2012-12-17 MED ORDER — SIMVASTATIN 40 MG PO TABS: 40.0000 mg | ORAL_TABLET | Freq: Every day | ORAL | Status: DC

## 2012-12-17 MED ORDER — PANTOPRAZOLE SODIUM 40 MG PO TBEC
40.0000 mg | DELAYED_RELEASE_TABLET | Freq: Every day | ORAL | Status: DC
  Administered 2012-12-17 – 2012-12-24 (×8): 40 mg via ORAL
  Filled 2012-12-17 (×8): qty 1

## 2012-12-17 MED ORDER — ACETAMINOPHEN 325 MG PO TABS
650.0000 mg | ORAL_TABLET | Freq: Four times a day (QID) | ORAL | Status: DC | PRN
  Administered 2012-12-18 – 2012-12-21 (×4): 650 mg via ORAL
  Filled 2012-12-17 (×4): qty 2

## 2012-12-17 MED ORDER — SODIUM CHLORIDE 0.9 % IJ SOLN
3.0000 mL | INTRAMUSCULAR | Status: DC | PRN
  Administered 2012-12-20: 3 mL via INTRAVENOUS

## 2012-12-17 NOTE — ED Provider Notes (Signed)
CSN: [id number]     Arrival date & time 12/17/12  1337 History   First MD Initiated Contact with Patient 12/17/12 1519     Chief Complaint  Patient presents with  . Altered Mental Status   (Consider location/radiation/quality/duration/timing/severity/associated sxs/prior Treatment) HPI  79 year old female brought in by her daughter for evaluation of a change in her mental status. Daughter first noticed on Friday or Saturday. Patient with bizarre behavior which is out of character for her. She's been having visual hallucinations. Telling family that seeing worms, bunnies, and small children. She has torn up the carpet in her room because of her fossa there are worms there. Symptoms have waxed and waned throughout the day. She has also been wandering from home and a loosely around the house. Normally can dress herself and do some basic cooking but has had some difficulty with this over the past 2 days as well. Her appetite has remained good though. No recent medication changes. No fever. No cough. Patient denies any pain. Daughter has some concern that patient may have had a stroke.  Patient denies any focal weakness. She did have a hemorrhagic stroke in July of this year, but " I know I'm not having a stroke. I knew I was then because I couldn't walk." No recent falls.   Past Medical History  Diagnosis Date  . Memory loss 06/13/2009  . Irritable bowel syndrome 06/13/2009  . MIGRAINE HEADACHE 06/13/2009  . OSTEOARTHRITIS 06/13/2009  . HYPERTENSION 06/13/2009  . GERD Sept 2012    [facility], pH and manometry.. hiatal hernia present, resting pressure of LES hypotensive. normal peristalsis esophageal contractions. DeMeester score 0.2. Excellent control of GERD on PPI.   Marland Kitchen HYPERCHOLESTEROLEMIA 06/13/2009  . DM 06/13/2009   Past Surgical History  Procedure Laterality Date  . Appendectomy      40+ years ago  . Esophagogastroduodenoscopy  03/30/2003     [id number] ring otherwise normal  esophagus/  Small hiatal hernia/ Normal stomach/ Status post dilation of the ring in an unusual manner  . Colonoscopy  03/30/2003    Poor prep precluded completion of the examination.  . Colonoscopy  June 2006    Dr. Elpidio Anis: normal  . 24 hour ph study  Sept 2012    DeMeester 0.2  . Esophageal manometry      hypotensive LES   Family History  Problem Relation Age of Onset  . Diabetes Father   . Diabetes Sister   . Colon cancer Brother     deceased at age  68, diagnosed age late 49s.    History  Substance Use Topics  . Smoking status: Never Smoker   . Smokeless tobacco: Not on file  . Alcohol Use: No   OB History   Grav Para Term Preterm Abortions TAB SAB Ect Mult Living                 Review of Systems   All systems reviewed and negative, other than as noted in HPI.  Allergies  Review of patient's allergies indicates no known allergies.  Home Medications   Current Outpatient Rx  Name  Route  Sig  Dispense  Refill  . acarbose (PRECOSE) 25 MG tablet   Oral   Take 1 tablet (25 mg total) by mouth 3 (three) times daily with meals.   90 tablet   11   . acetaminophen (TYLENOL) 500 MG tablet   Oral   Take 500 mg by mouth every 6 (six) hours  as needed for pain.         Marland Kitchen ALPRAZolam (XANAX) 0.5 MG tablet   Oral   Take 0.5 mg by mouth 3 (three) times daily.         . bromocriptine (PARLODEL) 2.5 MG tablet   Oral   Take 1 tablet (2.5 mg total) by mouth at bedtime.   30 tablet   11   . Cholecalciferol (VITAMIN D) 2000 UNITS CAPS   Oral   Take 2,000 Units by mouth daily.          Marland Kitchen esomeprazole (NEXIUM) 40 MG capsule   Oral   Take 1 capsule (40 mg total) by mouth 2 times daily at 12 noon and 4 pm.   60 capsule   5     Has tried and failed Prilosec, Protonix.   Marland Kitchen imipramine (TOFRANIL) 10 MG tablet   Oral   Take 40 mg by mouth at bedtime.          Marland Kitchen linagliptin (TRADJENTA) 5 MG TABS tablet   Oral   Take 1 tablet (5 mg total) by mouth daily.    30 tablet   6   . losartan (COZAAR) 50 MG tablet   Oral   Take 1 tablet (50 mg total) by mouth daily.   30 tablet   4   . metFORMIN (GLUCOPHAGE) 500 MG tablet   Oral   Take 1 tablet (500 mg total) by mouth 2 (two) times daily with a meal.   60 tablet   11     Pt was originally prescribed to take 1000 mg bid,  ...   . polyethylene glycol powder (GLYCOLAX/MIRALAX) powder   Oral   Take 17 g by mouth daily. For constipation. Hold if loose stools.   255 g   3   . simvastatin (ZOCOR) 40 MG tablet   Oral   Take 1 tablet (40 mg total) by mouth at bedtime.   30 tablet   11   . sucralfate (CARAFATE) 1 GM/10ML suspension   Oral   Take 10 mLs (1 g total) by mouth 4 (four) times daily.   420 mL   1   . verapamil (VERELAN PM) 120 MG 24 hr capsule   Oral   Take 120 mg by mouth daily.           BP 162/90  Pulse 89  Temp(Src) 97.4 F (36.3 C) (Oral)  Resp 14  Wt 112 lb (50.803 kg)  BMI 18.09 kg/m2  SpO2 99% Physical Exam  Nursing note and vitals reviewed. Constitutional: She appears well-developed and well-nourished. No distress.  Sitting on edge of bed. Repeatedly standing up and asking when she is going home. NAD.   HENT:  Head: Normocephalic and atraumatic.  Eyes: Conjunctivae are normal. Right eye exhibits no discharge. Left eye exhibits no discharge.  Neck: Neck supple.  Cardiovascular: Normal rate, regular rhythm and normal heart sounds.  Exam reveals no gallop and no friction rub.   No murmur heard. Pulmonary/Chest: Effort normal and breath sounds normal. No respiratory distress.  Abdominal: Soft. She exhibits no distension. There is no tenderness.  Musculoskeletal: She exhibits no edema and no tenderness.  Neurological: She is alert. No cranial nerve deficit. She exhibits normal muscle tone. Coordination normal.  Disoriented to time. Knows is at hospital but thinks [facility].speech clear. Content mostly appropriate. Cn 2-12 intact. Strength 5/5 b/l u/l  extremities. Sensation intact to light touch.   Skin: Skin is warm and  dry. She is not diaphoretic.  Psychiatric: Her behavior is normal. Thought content normal.    ED Course  Procedures (including critical care time) Labs Review Labs Reviewed  COMPREHENSIVE METABOLIC PANEL - Abnormal; Notable for the following:    Glucose, Bld 129 (*)    GFR calc non Af Amer 56 (*)    GFR calc Af Amer 65 (*)    All other components within normal limits  URINALYSIS, ROUTINE W REFLEX MICROSCOPIC - Abnormal; Notable for the following:    Leukocytes, UA MODERATE (*)    All other components within normal limits  CBC - Abnormal; Notable for the following:    Hemoglobin 11.0 (*)    HCT 33.2 (*)    RDW 16.4 (*)    All other components within normal limits  GLUCOSE, CAPILLARY - Abnormal; Notable for the following:    Glucose-Capillary 104 (*)    All other components within normal limits  URINE MICROSCOPIC-ADD ON - Abnormal; Notable for the following:    Bacteria, UA FEW (*)    All other components within normal limits  URINE CULTURE   Imaging Review Ct Head Wo Contrast  12/17/2012   CLINICAL DATA:  Altered mental status, patient says she does not feel right, history of stroke, hypertension, diabetes, initial encounter  EXAM: CT HEAD WITHOUT CONTRAST  TECHNIQUE: Contiguous axial images were obtained from the base of the skull through the vertex without intravenous contrast.  COMPARISON:  10/01/2012  FINDINGS: Generalized atrophy.  Normal ventricular morphology.  No midline shift or mass effect.  Minimal small vessel chronic ischemic changes in deep cerebral white matter.  Small focus of encephalomalacia at a high left parietal gyrus at site of old hemorrhagic infarct.  No intracranial hemorrhage, mass lesion, or evidence of acute infarction.  No extra-axial fluid collections.  Minimal atherosclerotic calcification is skullbase.  Bones and sinuses unremarkable.  IMPRESSION: Small focus of encephalomalacia at  high left parietal lobe at site of old hemorrhagic infarct.  Atrophy with minimal small vessel chronic ischemic changes in deep cerebral white matter.  No acute intracranial abnormalities.   Electronically Signed   By: Ulyses Southward M.D.   On: 12/17/2012 17:14    MDM   1. Delirium     78 year old female with a change in her mental status.  Patient has a nonfocal neurological examination aside from being disoriented to time. She has shown some bizarre behaviors though including washing her hands in the toilet in the emergency room. The relatively acute onset, waxing and waning nature and hallucinations are more consistent with delirium than progression of dementia. No obvious precipitant. Will admit. May ultimately need placement.   Raeford Razor, MD 12/20/12 [phone]

## 2012-12-17 NOTE — Progress Notes (Signed)
Patient arrived to unit from ED to 6N30. Daughter with patient. Patient lethargic and sleeping. Will answer questions and fall asleep again. Pt received Ativan and Haldol downstairs according to RN report prior to bringing pt to unit. Bed alarm put on. VSS. Call bell in reach and explained to daughter. No questions. Clarified which medications pt took already today. Will continue to monitor.

## 2012-12-17 NOTE — ED Notes (Signed)
Saturday morning pts daughter noticed that pt was confused and she has been confused since.  No facial droop, no slurred speech, no focal weakness.  Pt has been dx with dementia and has recently began wandering over the past 2-3 weeks and then this weekend her daughter noticed that she was more confused.  Pt is oriented to self and to time, she cant tell me that she is in the hospital and she is slow to respond, speech clear.

## 2012-12-17 NOTE — H&P (Signed)
[facility] History and Physical  [name] ZOX:[id number] DOB: Dec 18, 1928 DOA: 12/17/2012  Referring physician: Dr. Juleen China PCP: Evlyn Courier, MD  Specialists: none  Chief Complaint: delirium and hallucination  HPI: [name] is a 79 y.o. female with pmh of intercerebral hemorrhage in 09/2012, mild cognitive decline at baseline, hypertension, DM, who presents with 3-4 weeks of worsening memory and one week of hallucinations, disorientation, activation and wandering behavior.  The patient is accompanied by her daughter who lives with her.  She reports that for the past 5 days the patient has been awake almost constantly.  She has been trying to leave the house in the middle of the night to pay bills, get her car fixed and do errands.  She has been having visual hallucinations of worms, bugs, children.  She has torn at the carpet in her room because of the hallucinations.  She had been taking xanax at bedtime for sleep prior to last week, since symptoms started family giving xanax TID with no real change.  Review of Systems:  General: no recent weight loss, no fevers, chills, sweats, has been eating very well HEENT: no sore throat, eye pain, change in hearing Cardiovascular: no chest pain, palpitations, edema, weight gain, syncope Pulmonary: no shortness of breath, cough, wheezing, orthopnea,  GI: no abdominal pain, vomiting, diarrhea, melena, hematochezia MSK: no sore joints, change in strength, muscle pain SKIN: no rashes, ulcers, lesions Neuro: no focal weakness or numbness, no change in chronic headaches    Past Medical History  Diagnosis Date  . Memory loss 06/13/2009  . Irritable bowel syndrome 06/13/2009  . MIGRAINE HEADACHE 06/13/2009  . OSTEOARTHRITIS 06/13/2009  . HYPERTENSION 06/13/2009  . GERD Sept 2012    [facility], pH and manometry.. hiatal hernia present, resting pressure of LES hypotensive. normal peristalsis esophageal contractions. DeMeester  score 0.2. Excellent control of GERD on PPI.   [facility] HYPERCHOLESTEROLEMIA 06/13/2009  . DM 06/13/2009   Past Surgical History  Procedure Laterality Date  . Appendectomy      40+ years ago  . Esophagogastroduodenoscopy  03/30/2003     WJX:[id number] ring otherwise normal esophagus/  Small hiatal hernia/ Normal stomach/ Status post dilation of the ring in an unusual manner  . Colonoscopy  03/30/2003    Poor prep precluded completion of the examination.  . Colonoscopy  June 2006    Dr. Elpidio Anis: normal  . 24 hour ph study  Sept 2012    DeMeester 0.2  . Esophageal manometry      hypotensive LES   Social History: Lives with her daughter and son-in-law in [place]. Does not drink any alcohol. Never smoker. After stroke this summer had a walker, but does not need it, is usually able to walk up and down stairs with no problems.  No Known Allergies  Family History  Problem Relation Age of Onset  . Diabetes Father   . Diabetes Sister   . Colon cancer Brother     deceased at age  52, diagnosed age late 86s.     Prior to Admission medications   Medication Sig Start Date End Date Taking? Authorizing Provider  acarbose (PRECOSE) 25 MG tablet Take 1 tablet (25 mg total) by mouth 3 (three) times daily with meals. 08/26/12  Yes Romero Belling, MD  acetaminophen (TYLENOL) 500 MG tablet Take 500 mg by mouth every 6 (six) hours as needed for pain.   Yes Historical Provider, MD  ALPRAZolam Prudy Feeler) 0.5 MG tablet Take 0.5  mg by mouth 3 (three) times daily.   Yes Historical Provider, MD  bromocriptine (PARLODEL) 2.5 MG tablet Take 1 tablet (2.5 mg total) by mouth at bedtime. 02/13/12 02/12/13 Yes Carlus Pavlov, MD  Cholecalciferol (VITAMIN D) 2000 UNITS CAPS Take 2,000 Units by mouth daily.    Yes Historical Provider, MD  esomeprazole (NEXIUM) 40 MG capsule Take 1 capsule (40 mg total) by mouth 2 times daily at 12 noon and 4 pm. 05/19/12  Yes Joselyn Arrow, NP  imipramine (TOFRANIL) 10 MG tablet Take  40 mg by mouth at bedtime.    Yes Historical Provider, MD  linagliptin (TRADJENTA) 5 MG TABS tablet Take 1 tablet (5 mg total) by mouth daily. 10/31/12  Yes Romero Belling, MD  losartan (COZAAR) 50 MG tablet Take 1 tablet (50 mg total) by mouth daily. 12/08/12  Yes Romero Belling, MD  metFORMIN (GLUCOPHAGE) 500 MG tablet Take 1 tablet (500 mg total) by mouth 2 (two) times daily with a meal. 02/13/12  Yes Carlus Pavlov, MD  polyethylene glycol powder (GLYCOLAX/MIRALAX) powder Take 17 g by mouth daily. For constipation. Hold if loose stools. 01/01/12  Yes Nira Retort, NP  simvastatin (ZOCOR) 40 MG tablet Take 1 tablet (40 mg total) by mouth at bedtime. 02/13/12  Yes Carlus Pavlov, MD  sucralfate (CARAFATE) 1 GM/10ML suspension Take 10 mLs (1 g total) by mouth 4 (four) times daily. 10/01/12  Yes Audelia Hives, MD  verapamil (VERELAN PM) 120 MG 24 hr capsule Take 120 mg by mouth daily.    Yes Historical Provider, MD   Physical Exam: Filed Vitals:   12/17/12 1816  BP:   Pulse:   Temp: 97.4 F (36.3 C)  Resp:      General:  Alert, comfortable, frail, calm  Eyes: PERRLA, EOMI, conjunctiva clear  ENT: MMM, missing many teeth no signs of dental infection, oropharynx is clear  Neck: trachea midline, supple, no LAD  Cardiovascular: RRR no mrg, no edema, no JVD, no bruit  Respiratory: CTAB good air movement  Abdomen: bs normal, non tender, non distended  Skin: no rash, lesion  Musculoskeletal: strenght 4+/5 throughout  Psychiatric: calm  Neurologic: CNII-XII intact, slow to follow commands but cerebellar exam fairly intact, reflexes +1, generally weak, not oriented to date but recognizes daughter, knows where she is, not hallucinating at time of exam.  Labs on Admission:  Basic Metabolic Panel:  Recent Labs Lab 12/17/12 1351  NA 136  K 4.2  CL 103  CO2 22  GLUCOSE 129*  BUN 22  CREATININE 0.93  CALCIUM 9.5   Liver Function Tests:  Recent Labs Lab 12/17/12 1351  AST  18  ALT 15  ALKPHOS 43  BILITOT 0.6  PROT 7.3  ALBUMIN 3.8   No results found for this basename: LIPASE, AMYLASE,  in the last 168 hours No results found for this basename: AMMONIA,  in the last 168 hours CBC:  Recent Labs Lab 12/17/12 1351  WBC 6.1  HGB 11.0*  HCT 33.2*  MCV 82.0  PLT 203   Cardiac Enzymes: No results found for this basename: CKTOTAL, CKMB, CKMBINDEX, TROPONINI,  in the last 168 hours  BNP (last 3 results) No results found for this basename: PROBNP,  in the last 8760 hours CBG:  Recent Labs Lab 12/17/12 1537  GLUCAP 104*    Radiological Exams on Admission: Ct Head Wo Contrast  12/17/2012   CLINICAL DATA:  Altered mental status, patient says she does not feel right, history of  stroke, hypertension, diabetes, initial encounter  EXAM: CT HEAD WITHOUT CONTRAST  TECHNIQUE: Contiguous axial images were obtained from the base of the skull through the vertex without intravenous contrast.  COMPARISON:  10/01/2012  FINDINGS: Generalized atrophy.  Normal ventricular morphology.  No midline shift or mass effect.  Minimal small vessel chronic ischemic changes in deep cerebral white matter.  Small focus of encephalomalacia at a high left parietal gyrus at site of old hemorrhagic infarct.  No intracranial hemorrhage, mass lesion, or evidence of acute infarction.  No extra-axial fluid collections.  Minimal atherosclerotic calcification is skullbase.  Bones and sinuses unremarkable.  IMPRESSION: Small focus of encephalomalacia at high left parietal lobe at site of old hemorrhagic infarct.  Atrophy with minimal small vessel chronic ischemic changes in deep cerebral white matter.  No acute intracranial abnormalities.   Electronically Signed   By: Ulyses Southward M.D.   On: 12/17/2012 17:14    Assessment/Plan 1) delirium hallucination: in an elderly patient with baseline mild cognitive decline and recent ICH: CT negative for acute change but does show chronic ischemic changes.  Labs  are normal. UA negative at PCP office, will recheck. Will check TSH. Check CXR to be sure not missing hidden pneumonia. Check HbA1C to assess recent DM control. Reviewing her medications xanax could exacerbate symptoms- but was only increase after symptoms started.  Imipramine and bromocriptine could be contributing and will hold for now.  Do not see any other medications commonly associated with delirium.  Suspect that these symptoms are an exacerbation of ongoing cognitive impairment of aging/dementia with microvascular disease 2) diabetes: check A1C 3) HTN: controled at this time, continue home meds 4) HLD: continue statin   Code Status: Full Family Communication: spoke with patient and daughter Disposition Plan: admit OBS to medical floor  Time spent: 60 minutes  [facility] Pager [phone]  If 7PM-7AM, please contact night-coverage [email or web address] Password [email or web address] 12/17/2012, 7:11 PM

## 2012-12-17 NOTE — ED Notes (Signed)
Pt attempting and threatening to leave. Daughter at bedside. Dr. Juleen China notified and at bedside.

## 2012-12-18 ENCOUNTER — Encounter (HOSPITAL_COMMUNITY): Payer: Self-pay | Admitting: *Deleted

## 2012-12-18 ENCOUNTER — Inpatient Hospital Stay (HOSPITAL_COMMUNITY): Payer: Medicare Other

## 2012-12-18 LAB — URINE CULTURE: Colony Count: 4000

## 2012-12-18 LAB — GLUCOSE, CAPILLARY
Glucose-Capillary: 222 mg/dL — ABNORMAL HIGH (ref 70–99)
Glucose-Capillary: 128 mg/dL — ABNORMAL HIGH (ref 70–99)

## 2012-12-18 LAB — MRSA PCR SCREENING: MRSA by PCR: NEGATIVE

## 2012-12-18 LAB — HEMOGLOBIN A1C: Mean Plasma Glucose: 163 mg/dL — ABNORMAL HIGH (ref <117)

## 2012-12-18 MED ORDER — INFLUENZA VAC SPLIT QUAD 0.5 ML IM SUSP
0.5000 mL | INTRAMUSCULAR | Status: AC
  Administered 2012-12-19: 0.5 mL via INTRAMUSCULAR
  Filled 2012-12-18: qty 0.5

## 2012-12-18 MED ORDER — INSULIN ASPART 100 UNIT/ML ~~LOC~~ SOLN
0.0000 [IU] | Freq: Three times a day (TID) | SUBCUTANEOUS | Status: DC
  Administered 2012-12-18: 3 [IU] via SUBCUTANEOUS
  Administered 2012-12-18 – 2012-12-19 (×3): 1 [IU] via SUBCUTANEOUS
  Administered 2012-12-19: 2 [IU] via SUBCUTANEOUS
  Administered 2012-12-20: 3 [IU] via SUBCUTANEOUS
  Administered 2012-12-20: 2 [IU] via SUBCUTANEOUS
  Administered 2012-12-21: 3 [IU] via SUBCUTANEOUS
  Administered 2012-12-21: 2 [IU] via SUBCUTANEOUS
  Administered 2012-12-21 – 2012-12-22 (×2): 3 [IU] via SUBCUTANEOUS
  Administered 2012-12-22: 5 [IU] via SUBCUTANEOUS
  Administered 2012-12-22: 3 [IU] via SUBCUTANEOUS
  Administered 2012-12-23: 2 [IU] via SUBCUTANEOUS
  Administered 2012-12-23: 5 [IU] via SUBCUTANEOUS
  Administered 2012-12-23: 1 [IU] via SUBCUTANEOUS
  Administered 2012-12-24 (×2): 2 [IU] via SUBCUTANEOUS
  Administered 2012-12-24: 7 [IU] via SUBCUTANEOUS

## 2012-12-18 MED ORDER — HALOPERIDOL LACTATE 5 MG/ML IJ SOLN
5.0000 mg | Freq: Once | INTRAMUSCULAR | Status: AC
  Administered 2012-12-18: 5 mg via INTRAVENOUS
  Filled 2012-12-18: qty 1

## 2012-12-18 MED ORDER — MIRTAZAPINE 7.5 MG PO TABS
7.5000 mg | ORAL_TABLET | Freq: Every day | ORAL | Status: DC
  Administered 2012-12-18 – 2012-12-20 (×3): 7.5 mg via ORAL
  Filled 2012-12-18 (×4): qty 1

## 2012-12-18 MED ORDER — GADOBENATE DIMEGLUMINE 529 MG/ML IV SOLN
10.0000 mL | Freq: Once | INTRAVENOUS | Status: AC
  Administered 2012-12-18: 10 mL via INTRAVENOUS

## 2012-12-18 MED ORDER — RISPERIDONE 0.25 MG PO TABS
0.2500 mg | ORAL_TABLET | Freq: Two times a day (BID) | ORAL | Status: DC | PRN
  Administered 2012-12-18 – 2012-12-20 (×2): 0.25 mg via ORAL
  Filled 2012-12-18 (×3): qty 1

## 2012-12-18 MED ORDER — CLONAZEPAM 0.5 MG PO TABS
0.2500 mg | ORAL_TABLET | Freq: Two times a day (BID) | ORAL | Status: DC
  Administered 2012-12-18 – 2012-12-24 (×13): 0.25 mg via ORAL
  Filled 2012-12-18 (×13): qty 1

## 2012-12-18 NOTE — Progress Notes (Signed)
[facility] PROGRESS NOTE  [name] ZOX:[id number] DOB: Aug 07, 1928 DOA: 12/17/2012 PCP: Evlyn Courier, MD  Assessment/Plan: 1. Delirium, Hallucination: improved. Will follow urine culture to determine if patient need antibiotics. Will order MRI brain. Chest x ray negative for PNA. I have consulted psych to help with medications. Agree with holding imipramine and bromocriptine. Will defer to psych resuming this medications. Will continue for now with xanax.  2. Diabetes: HB A 1 c at 7.3. Hold metformin while in the hospital. Start SSI.  3. HTN: continue home meds  4.   HLD: continue statin    Code Status: Full Family Communication: none at bedside.  Disposition Plan: To be determine. Follow up urine culture, psych recommendation.    Consultants:  Psych  Procedures:  none  Antibiotics: None  HPI/Subjective: Patient oriented to place year. She denies this morning hallucination. Periods of confusion, hallucination fluctuates.   Objective: Filed Vitals:   12/18/12 0625  BP: 159/83  Pulse: 88  Temp: 98.1 F (36.7 C)  Resp: 16    Intake/Output Summary (Last 24 hours) at 12/18/12 0930 Last data filed at 12/17/12 2200  Gross per 24 hour  Intake    120 ml  Output      0 ml  Net    120 ml   Filed Weights   12/17/12 1341 12/17/12 2013  Weight: 50.803 kg (112 lb) 48.308 kg (106 lb 8 oz)    Exam:   General: no distress.   Cardiovascular: S 1, S 2 RRR  Respiratory: CTA  Abdomen: BS present, nt  Musculoskeletal: no edema  Data Reviewed: Basic Metabolic Panel:  Recent Labs Lab 12/17/12 1351 12/17/12 2007  NA 136  --   K 4.2  --   CL 103  --   CO2 22  --   GLUCOSE 129*  --   BUN 22  --   CREATININE 0.93 0.80  CALCIUM 9.5  --    Liver Function Tests:  Recent Labs Lab 12/17/12 1351  AST 18  ALT 15  ALKPHOS 43  BILITOT 0.6  PROT 7.3  ALBUMIN 3.8   No results found for this basename: LIPASE, AMYLASE,  in the last 168 hours No results  found for this basename: AMMONIA,  in the last 168 hours CBC:  Recent Labs Lab 12/17/12 1351 12/17/12 2007  WBC 6.1 5.7  HGB 11.0* 11.5*  HCT 33.2* 33.2*  MCV 82.0 81.2  PLT 203 218   Cardiac Enzymes: No results found for this basename: CKTOTAL, CKMB, CKMBINDEX, TROPONINI,  in the last 168 hours BNP (last 3 results) No results found for this basename: PROBNP,  in the last 8760 hours CBG:  Recent Labs Lab 12/17/12 1537  GLUCAP 104*    Recent Results (from the past 240 hour(s))  MRSA PCR SCREENING     Status: None   Collection Time    12/18/12  6:30 AM      Result Value Range Status   MRSA by PCR NEGATIVE  NEGATIVE Final   Comment:            The GeneXpert MRSA Assay (FDA     approved for NASAL specimens     only), is one component of a     comprehensive MRSA colonization     surveillance program. It is not     intended to diagnose MRSA     infection nor to guide or     monitor treatment for     MRSA infections.  Studies: Dg Chest 2 View  12/17/2012   *RADIOLOGY REPORT*  Clinical Data: Shortness of breath, weakness, evaluate for pneumonia, initial encounter.  CHEST - 2 VIEW  Comparison: 10/01/2012; 09/19/2012; 01/10/2006  Findings:  Grossly unchanged cardiac silhouette and mediastinal contours with mild tortuosity of the thoracic aorta.  The lungs appear hyperexpanded with flattening of bilateral hemidiaphragms and mild diffuse slightly nodular thickening of the pulmonary interstitium. No focal airspace opacities.  No pleural effusion or pneumothorax. No evidence of edema.  Osteopenia with grossly unchanged mild compression deformity of a mid thoracic vertebral body.  IMPRESSION: Lung hyperexpansion without acute cardiopulmonary disease. Specifically, no definite evidence of pneumonia.   Original Report Authenticated By: Tacey Ruiz, MD   Ct Head Wo Contrast  12/17/2012   CLINICAL DATA:  Altered mental status, patient says she does not feel right, history of stroke,  hypertension, diabetes, initial encounter  EXAM: CT HEAD WITHOUT CONTRAST  TECHNIQUE: Contiguous axial images were obtained from the base of the skull through the vertex without intravenous contrast.  COMPARISON:  10/01/2012  FINDINGS: Generalized atrophy.  Normal ventricular morphology.  No midline shift or mass effect.  Minimal small vessel chronic ischemic changes in deep cerebral white matter.  Small focus of encephalomalacia at a high left parietal gyrus at site of old hemorrhagic infarct.  No intracranial hemorrhage, mass lesion, or evidence of acute infarction.  No extra-axial fluid collections.  Minimal atherosclerotic calcification is skullbase.  Bones and sinuses unremarkable.  IMPRESSION: Small focus of encephalomalacia at high left parietal lobe at site of old hemorrhagic infarct.  Atrophy with minimal small vessel chronic ischemic changes in deep cerebral white matter.  No acute intracranial abnormalities.   Electronically Signed   By: Ulyses Southward M.D.   On: 12/17/2012 17:14    Scheduled Meds: . acarbose  25 mg Oral TID WC  . ALPRAZolam  0.5 mg Oral TID  . atorvastatin  20 mg Oral QHS  . heparin  5,000 Units Subcutaneous Q8H  . losartan  50 mg Oral Daily  . pantoprazole  40 mg Oral Daily  . polyethylene glycol  17 g Oral Daily  . sodium chloride  3 mL Intravenous Q12H  . sucralfate  1 g Oral QID  . verapamil  120 mg Oral Daily   Continuous Infusions:   Active Problems:   DM   HYPERCHOLESTEROLEMIA   HYPERTENSION   GERD   Delirium   Hallucination    Time spent: 25 minutes    Shakoya Gilmore  [facility] Pager [phone]. If 7PM-7AM, please contact night-coverage at [email or web address], password [email or web address] 12/18/2012, 9:30 AM  LOS: 1 day

## 2012-12-18 NOTE — Consult Note (Signed)
Reason for Consult: Hallucinations and cognitive decline  Referring Physician: Dr. London Sheer is an 79 y.o. female.  HPI: [name] is seen and chart reviewed. Case discussed with the staff RN and Dr. Sunnie Nielsen. Reportedly patient presents with 3-4 weeks of worsening memory and one week of hallucinations, disorientation, activation and wandering behavior. The patient daughter who lives with her, reports that for the past 5 days the patient has been awake almost constantly. She has been trying to leave the house in the middle of the night to pay bills, get her car fixed and do errands. She has been having visual hallucinations of worms, bugs, children. She has torn at the carpet in her room because of the hallucinations. Patient medication bromocriptine and imipramine was stopped on admission  to reduce confusion. She had been taking xanax at bedtime for sleep prior to last week, since symptoms started family giving xanax TID with no real change. Patient has a history of intercerebral hemorrhage in 09/2012, mild cognitive decline at baseline, hypertension, and DM.  Mental Status Examination: Patient appeared as per his stated age, dressed in hospital gown, and fairly groomed. Patient has has been sleeping without distress.   Past Medical History  Diagnosis Date  . Memory loss 06/13/2009  . Irritable bowel syndrome 06/13/2009  . MIGRAINE HEADACHE 06/13/2009  . OSTEOARTHRITIS 06/13/2009  . HYPERTENSION 06/13/2009  . GERD Sept 2012    [facility], pH and manometry.. hiatal hernia present, resting pressure of LES hypotensive. normal peristalsis esophageal contractions. DeMeester score 0.2. Excellent control of GERD on PPI.   [facility] Kitchen HYPERCHOLESTEROLEMIA 06/13/2009  . DM 06/13/2009    Past Surgical History  Procedure Laterality Date  . Appendectomy      40+ years ago  . Esophagogastroduodenoscopy  03/30/2003     [id number] ring otherwise normal esophagus/  Small hiatal hernia/  Normal stomach/ Status post dilation of the ring in an unusual manner  . Colonoscopy  03/30/2003    Poor prep precluded completion of the examination.  . Colonoscopy  June 2006    Dr. Elpidio Anis: normal  . 24 hour ph study  Sept 2012    DeMeester 0.2  . Esophageal manometry      hypotensive LES    Family History  Problem Relation Age of Onset  . Diabetes Father   . Diabetes Sister   . Colon cancer Brother     deceased at age  48, diagnosed age late 62s.     Social History:  reports that she has never smoked. She does not have any smokeless tobacco history on file. She reports that she does not drink alcohol or use illicit drugs.  Allergies: No Known Allergies  Medications: I have reviewed the patient's current medications.  Results for orders placed during the hospital encounter of 12/17/12 (from the past 48 hour(s))  COMPREHENSIVE METABOLIC PANEL     Status: Abnormal   Collection Time    12/17/12  1:51 PM      Result Value Range   Sodium 136  135 - 145 mEq/L   Potassium 4.2  3.5 - 5.1 mEq/L   Chloride 103  96 - 112 mEq/L   CO2 22  19 - 32 mEq/L   Glucose, Bld 129 (*) 70 - 99 mg/dL   BUN 22  6 - 23 mg/dL   Creatinine, Ser 1.30  0.50 - 1.10 mg/dL   Calcium 9.5  8.4 - 86.5 mg/dL   Total Protein 7.3  6.0 - 8.3 g/dL   Albumin 3.8  3.5 - 5.2 g/dL   AST 18  0 - 37 U/L   ALT 15  0 - 35 U/L   Alkaline Phosphatase 43  39 - 117 U/L   Total Bilirubin 0.6  0.3 - 1.2 mg/dL   GFR calc non Af Amer 56 (*) >90 mL/min   GFR calc Af Amer 65 (*) >90 mL/min   Comment: (NOTE)     The eGFR has been calculated using the CKD EPI equation.     This calculation has not been validated in all clinical situations.     eGFR's persistently <90 mL/min signify possible Chronic Kidney     Disease.  CBC     Status: Abnormal   Collection Time    12/17/12  1:51 PM      Result Value Range   WBC 6.1  4.0 - 10.5 K/uL   RBC 4.05  3.87 - 5.11 MIL/uL   Hemoglobin 11.0 (*) 12.0 - 15.0 g/dL   HCT 16.1  (*) 09.6 - 46.0 %   MCV 82.0  78.0 - 100.0 fL   MCH 27.2  26.0 - 34.0 pg   MCHC 33.1  30.0 - 36.0 g/dL   RDW 04.5 (*) 40.9 - 81.1 %   Platelets 203  150 - 400 K/uL  GLUCOSE, CAPILLARY     Status: Abnormal   Collection Time    12/17/12  3:37 PM      Result Value Range   Glucose-Capillary 104 (*) 70 - 99 mg/dL  URINALYSIS, ROUTINE W REFLEX MICROSCOPIC     Status: Abnormal   Collection Time    12/17/12  3:48 PM      Result Value Range   Color, Urine YELLOW  YELLOW   APPearance CLEAR  CLEAR   Specific Gravity, Urine 1.022  1.005 - 1.030   pH 6.0  5.0 - 8.0   Glucose, UA NEGATIVE  NEGATIVE mg/dL   Hgb urine dipstick NEGATIVE  NEGATIVE   Bilirubin Urine NEGATIVE  NEGATIVE   Ketones, ur NEGATIVE  NEGATIVE mg/dL   Protein, ur NEGATIVE  NEGATIVE mg/dL   Urobilinogen, UA 0.2  0.0 - 1.0 mg/dL   Nitrite NEGATIVE  NEGATIVE   Leukocytes, UA MODERATE (*) NEGATIVE  URINE MICROSCOPIC-ADD ON     Status: Abnormal   Collection Time    12/17/12  3:48 PM      Result Value Range   Squamous Epithelial / LPF RARE  RARE   WBC, UA 3-6  <3 WBC/hpf   RBC / HPF 0-2  <3 RBC/hpf   Bacteria, UA FEW (*) RARE  TSH     Status: None   Collection Time    12/17/12  7:43 PM      Result Value Range   TSH 0.821  0.350 - 4.500 uIU/mL   Comment: Performed at [facility]  HEMOGLOBIN A1C     Status: Abnormal   Collection Time    12/17/12  7:43 PM      Result Value Range   Hemoglobin A1C 7.3 (*) <5.7 %   Comment: (NOTE)  According to the ADA Clinical Practice Recommendations for 2011, when     HbA1c is used as a screening test:      >=6.5%   Diagnostic of Diabetes Mellitus               (if abnormal result is confirmed)     5.7-6.4%   Increased risk of developing Diabetes Mellitus     References:Diagnosis and Classification of Diabetes Mellitus,Diabetes     Care,2011,34(Suppl 1):S62-S69 and Standards of Medical Care in              Diabetes - 2011,Diabetes Care,2011,34 (Suppl 1):S11-S61.   Mean Plasma Glucose 163 (*) <117 mg/dL   Comment: Performed at [facility]  CBC     Status: Abnormal   Collection Time    12/17/12  8:07 PM      Result Value Range   WBC 5.7  4.0 - 10.5 K/uL   RBC 4.09  3.87 - 5.11 MIL/uL   Hemoglobin 11.5 (*) 12.0 - 15.0 g/dL   HCT 16.1 (*) 09.6 - 04.5 %   MCV 81.2  78.0 - 100.0 fL   MCH 28.1  26.0 - 34.0 pg   MCHC 34.6  30.0 - 36.0 g/dL   RDW 40.9 (*) 81.1 - 91.4 %   Platelets 218  150 - 400 K/uL  CREATININE, SERUM     Status: Abnormal   Collection Time    12/17/12  8:07 PM      Result Value Range   Creatinine, Ser 0.80  0.50 - 1.10 mg/dL   GFR calc non Af Amer 67 (*) >90 mL/min   GFR calc Af Amer 78 (*) >90 mL/min   Comment: (NOTE)     The eGFR has been calculated using the CKD EPI equation.     This calculation has not been validated in all clinical situations.     eGFR's persistently <90 mL/min signify possible Chronic Kidney     Disease.  MRSA PCR SCREENING     Status: None   Collection Time    12/18/12  6:30 AM      Result Value Range   MRSA by PCR NEGATIVE  NEGATIVE   Comment:            The GeneXpert MRSA Assay (FDA     approved for NASAL specimens     only), is one component of a     comprehensive MRSA colonization     surveillance program. It is not     intended to diagnose MRSA     infection nor to guide or     monitor treatment for     MRSA infections.  GLUCOSE, CAPILLARY     Status: Abnormal   Collection Time    12/18/12 11:34 AM      Result Value Range   Glucose-Capillary 222 (*) 70 - 99 mg/dL    Dg Chest 2 View  78/04/9560   *RADIOLOGY REPORT*  Clinical Data: Shortness of breath, weakness, evaluate for pneumonia, initial encounter.  CHEST - 2 VIEW  Comparison: 10/01/2012; 09/19/2012; 01/10/2006  Findings:  Grossly unchanged cardiac silhouette and mediastinal contours with mild tortuosity of the thoracic aorta.  The lungs appear hyperexpanded  with flattening of bilateral hemidiaphragms and mild diffuse slightly nodular thickening of the pulmonary interstitium. No focal airspace opacities.  No pleural effusion or pneumothorax. No evidence of edema.  Osteopenia with grossly unchanged mild compression deformity of a mid thoracic vertebral body.  IMPRESSION: Lung hyperexpansion  without acute cardiopulmonary disease. Specifically, no definite evidence of pneumonia.   Original Report Authenticated By: Tacey Ruiz, MD   Ct Head Wo Contrast  12/17/2012   CLINICAL DATA:  Altered mental status, patient says she does not feel right, history of stroke, hypertension, diabetes, initial encounter  EXAM: CT HEAD WITHOUT CONTRAST  TECHNIQUE: Contiguous axial images were obtained from the base of the skull through the vertex without intravenous contrast.  COMPARISON:  10/01/2012  FINDINGS: Generalized atrophy.  Normal ventricular morphology.  No midline shift or mass effect.  Minimal small vessel chronic ischemic changes in deep cerebral white matter.  Small focus of encephalomalacia at a high left parietal gyrus at site of old hemorrhagic infarct.  No intracranial hemorrhage, mass lesion, or evidence of acute infarction.  No extra-axial fluid collections.  Minimal atherosclerotic calcification is skullbase.  Bones and sinuses unremarkable.  IMPRESSION: Small focus of encephalomalacia at high left parietal lobe at site of old hemorrhagic infarct.  Atrophy with minimal small vessel chronic ischemic changes in deep cerebral white matter.  No acute intracranial abnormalities.   Electronically Signed   By: Ulyses Southward M.D.   On: 12/17/2012 17:14    Positive for sleep disturbance and Hallucinations and cognitive decline Blood pressure 138/77, pulse 97, temperature 97.9 F (36.6 C), temperature source Oral, resp. rate 15, height 5\' 5"  (1.651 m), weight 48.308 kg (106 lb 8 oz), SpO2 100.00%.   Assessment/Plan: Delirium with hallucinations  Case discussed with her  doctor [name] and staff RN Start Risperidone 0.25 mg PO BID/PRN Change Xanax to Klonopin 0.25 mg PO BID Start Remeron 7.5 mg PO Qhs Monitor for adverse effects We'll contact patient's family to obtain further information  Appreciate psych consult and follow up as needed.  [name],JANARDHAHA R. 12/18/2012, 3:40 PM

## 2012-12-18 NOTE — Progress Notes (Signed)
Patient very irritated, wanting to leave hospital and "go home." Son in law at bedside. Patient banging on doors trying to leave the room, swinging her arms at her son in law to let her leave now. Not able to rationally reason with patient. She is adamant about leaving. Physician on call paged. Orders given for a 1xdose of Haldol. Patient given medication and now in the bed, still somewhat restless but much calmer and not fighting to leave at this point. Will continue to monitor.

## 2012-12-19 DIAGNOSIS — I10 Essential (primary) hypertension: Secondary | ICD-10-CM

## 2012-12-19 DIAGNOSIS — R443 Hallucinations, unspecified: Secondary | ICD-10-CM

## 2012-12-19 DIAGNOSIS — E119 Type 2 diabetes mellitus without complications: Secondary | ICD-10-CM

## 2012-12-19 DIAGNOSIS — I619 Nontraumatic intracerebral hemorrhage, unspecified: Secondary | ICD-10-CM

## 2012-12-19 DIAGNOSIS — I517 Cardiomegaly: Secondary | ICD-10-CM

## 2012-12-19 DIAGNOSIS — R404 Transient alteration of awareness: Secondary | ICD-10-CM

## 2012-12-19 LAB — GLUCOSE, CAPILLARY
Glucose-Capillary: 139 mg/dL — ABNORMAL HIGH (ref 70–99)
Glucose-Capillary: 125 mg/dL — ABNORMAL HIGH (ref 70–99)
Glucose-Capillary: 180 mg/dL — ABNORMAL HIGH (ref 70–99)

## 2012-12-19 MED ORDER — ASPIRIN 325 MG PO TABS
325.0000 mg | ORAL_TABLET | Freq: Every day | ORAL | Status: DC
  Administered 2012-12-19 – 2012-12-20 (×2): 325 mg via ORAL
  Filled 2012-12-19 (×2): qty 1

## 2012-12-19 MED ORDER — DEXTROSE 5 % IV SOLN
1.0000 g | INTRAVENOUS | Status: DC
  Administered 2012-12-19 – 2012-12-20 (×2): 1 g via INTRAVENOUS
  Filled 2012-12-19 (×2): qty 10

## 2012-12-19 NOTE — Progress Notes (Signed)
Patient refusing vitals last night due to frustrations and this morning she keeps pulling away from the nurse tech. Does not want to keep gown on. She is very irritable this morning and wants to be left alone. Will continue to monitor and try vitals again later.

## 2012-12-19 NOTE — Evaluation (Signed)
I have reviewed this note and agree with all findings. Kati Dezaree Tracey, PT, DPT Pager: [phone]   

## 2012-12-19 NOTE — Progress Notes (Signed)
  Echocardiogram 2D Echocardiogram has been performed.  [name] FRANCES 12/19/2012, 6:50 PM

## 2012-12-19 NOTE — Evaluation (Signed)
Physical Therapy Evaluation Patient Details Name: [name] MRN: [id number] DOB: [birth year] Today's Date: 12/19/2012 Time: 7829-5621 PT Time Calculation (min): 14 min  PT Assessment / Plan / Recommendation History of Present Illness  Pt is a 79 y.o. female with pmh of intercerebral hemorrhage in 09/2012, mild cognitive decline at baseline, hypertension, DM, who presents with 3-4 weeks of worsening memory and one week of hallucinations, disorientation, activation and wandering behavior. The patient is accompanied by her daughter who lives with her. She reports that for the past 5 days the patient has been awake almost constantly. She has been trying to leave the house in the middle of the night to pay bills, get her car fixed and do errands. She has been having visual hallucinations of worms, bugs, children. She has torn at the carpet in her room because of the hallucinations. She had been taking xanax at bedtime for sleep prior to last week, since symptoms started family giving xanax TID with no real change.   Clinical Impression  Pt admitted with delirium with hallucinations. Pt currently with functional limitations due to the deficits listed below (see PT Problem List). Pt able to ambulate with RW and min A today, as pt drifts towards R-side during ambulation and has difficulty negotiating objects. Pt unable to provide clear living situation and assistance available. PT would benefit from skilled SNF as she requires 24/7 supervision due to cognitive impairments. If family able to provide 24/7 supervision and assist at home, pt could potentially d/c home with HHPT to assess functional mobility and safety at home.  Pt will benefit from skilled PT to increase their independence and safety with mobility to allow discharge.   PT Assessment  Patient needs continued PT services    Follow Up Recommendations  SNF;Supervision/Assistance - 24 hour    Does the patient have the potential to tolerate  intense rehabilitation      Barriers to Discharge Other (comment) Pt not able to clearly state if she has 24/7 assistance available at home, need to confirm assistance with family members if possible.    Equipment Recommendations  Other (comment) (Verify that pt has RW at home)    Recommendations for Other Services     Frequency Min 3X/week    Precautions / Restrictions Precautions Precautions: Fall Restrictions Weight Bearing Restrictions: No   Pertinent Vitals/Pain No c/o of pain, dizziness, or SOB during session.      Mobility  Bed Mobility Details for Bed Mobility Assistance: pt ambulating with PT on arrival Transfers Transfers: Sit to Stand;Stand to Sit Sit to Stand: 4: Min guard;From chair/3-in-1 Stand to Sit: 4: Min guard;To chair/3-in-1 Details for Transfer Assistance: Min guard to ensure safety. Ambulation/Gait Ambulation/Gait Assistance: 4: Min assist Ambulation Distance (Feet): 125 Feet Assistive device: Rolling walker Ambulation/Gait Assistance Details: Min assist to guide RW back to midline as pt ambulates towards R side and has difficulty negotiating objects. Pt states she is occassionally aware of drifting towards R side. VC's to keep RW on floor and to stay within RW. No c/o of pain during session but pt states LEs are tired and requested to amb. back to room Gait Pattern: Step-through pattern;Decreased stride length;Trunk flexed Gait velocity: Decreased. General Gait Details: Trunk flexed occassionally during amb. Stairs: No Wheelchair Mobility Wheelchair Mobility: No    Exercises     PT Diagnosis: Difficulty walking  PT Problem List: Decreased strength;Decreased activity tolerance;Decreased mobility;Decreased knowledge of use of DME;Decreased safety awareness PT Treatment Interventions: DME instruction;Gait training;Functional mobility  training;Therapeutic activities;Therapeutic exercise;Balance training;Patient/family education     PT Goals(Current  goals can be found in the care plan section) Acute Rehab PT Goals Patient Stated Goal: none specified PT Goal Formulation: With patient Time For Goal Achievement: 01/02/13 Potential to Achieve Goals: Good  Visit Information  Last PT Received On: 12/19/12 Assistance Needed: +1 History of Present Illness: Patient is an 79 y/o female admitted with right sided weakness head CT positive for interparenchymal hemorrhage in the left parietal lobe felt to be hypertensive in nature.       Prior Functioning  Home Living Family/patient expects to be discharged to:: Private residence Living Arrangements: Other relatives (pt reports she lives with family) Available Help at Discharge: Other (Comment) (pt unable to answer due to cognitive deficits) Type of Home: House Home Access: Stairs to enter [employer] of Steps: 2-3 steps  Entrance Stairs-Rails: None Home Layout: Two level;Bed/bath upstairs Alternate Level Stairs-Number of Steps: one flight has one rail but could not specificy which side Home Equipment: Walker - 2 wheels Additional Comments: Pt not clear regarding level of assist she has from family at home (24/7 vs intermittent). Prior Function Level of Independence: Needs assistance Comments: pt reports family assists her in ADLs and mobility at home Communication Communication: No difficulties    Cognition  Cognition Arousal/Alertness: Awake/alert Behavior During Therapy: WFL for tasks assessed/performed Overall Cognitive Status: History of cognitive impairments - at baseline    Extremity/Trunk Assessment Upper Extremity Assessment Upper Extremity Assessment: Overall WFL for tasks assessed;Difficult to assess due to impaired cognition Lower Extremity Assessment Lower Extremity Assessment: Difficult to assess due to impaired cognition (pt able to move bil LEs against gravity: 3/5)   Balance    End of Session PT - End of Session Equipment Utilized During Treatment: Gait  belt Activity Tolerance: Patient limited by fatigue Patient left: in chair;with nursing/sitter in room;Other (comment);with call bell/phone within reach (OT)  GP     Sol Blazing 12/19/2012, 4:19 PM

## 2012-12-19 NOTE — Evaluation (Signed)
Occupational Therapy Evaluation Patient Details Name: [name] MRN: [id number] DOB: [birth year] Today's Date: 12/19/2012 Time: 2440-1027 OT Time Calculation (min): 15 min  OT Assessment / Plan / Recommendation History of present illness Patient is an 79 y/o female admitted with right sided weakness head CT positive for interparenchymal hemorrhage in the left parietal lobe felt to be hypertensive in nature. Has been experiencing 3-4 weeks of worsening memory and one week of hallucinations, disorientation, activation and wandering behavior.   Clinical Impression   Pt admitted with above. Will continue to follow pt acutely in order to address below problem list. Recommending SNF for d/c planning due to pt's need for 24/7 supervision/assist.  Pt is a poor historian and unable to provide reliable information regarding level of assist at home. If family/caregivers are able to provide 24/7 supervision/assist at home, then may update plan to home.      OT Assessment  Patient needs continued OT Services    Follow Up Recommendations  SNF;Supervision/Assistance - 24 hour    Barriers to Discharge Other (comment) (unsure if pt has 24/7 supervision at home)    Equipment Recommendations  None recommended by OT    Recommendations for Other Services    Frequency  Min 2X/week    Precautions / Restrictions Precautions Precautions: Fall Restrictions Weight Bearing Restrictions: No   Pertinent Vitals/Pain See vitals    ADL  Upper Body Bathing: Simulated;Supervision/safety Where Assessed - Upper Body Bathing: Unsupported sitting Lower Body Bathing: Simulated;Min guard Where Assessed - Lower Body Bathing: Unsupported sit to stand Upper Body Dressing: Simulated;Supervision/safety Where Assessed - Upper Body Dressing: Unsupported sitting Lower Body Dressing: Performed;Min guard Where Assessed - Lower Body Dressing: Unsupported sit to stand Toilet Transfer: Simulated;Minimal assistance Toilet  Transfer Method: Sit to Barista:  (simulated at recliner) Equipment Used: Gait belt;Rolling walker Transfers/Ambulation Related to ADLs: Pt ambulating with PT on OT arrival.  Overall min guard with RW but required occasional min assist for steadying when turning with RW, especially when backing up to chair. ADL Comments: Received pt as she returned to room after ambulating with PT in hall.  Pt requiring occasional min assist for steadying as she walked with RW.  Pt is a poor historian and  unable to provide clear information regarding PLOF.  Pt removed glasses from face to clean them but then placed glasses upside down on her face after cleaning them.  Pt able to correctly position glasses with verbal cues to identify error. When asked what time it was, pt looked at clock but was unable to read the time, kept repeating "7:00" when time read "2:45".     OT Diagnosis: Generalized weakness;Cognitive deficits  OT Problem List: Impaired balance (sitting and/or standing);Decreased cognition;Decreased knowledge of use of DME or AE OT Treatment Interventions: Self-care/ADL training;DME and/or AE instruction;Therapeutic activities;Patient/family education;Balance training   OT Goals(Current goals can be found in the care plan section) Acute Rehab OT Goals OT Goal Formulation: With patient Time For Goal Achievement: 01/02/13 Potential to Achieve Goals: Good  Visit Information  Last OT Received On: 12/19/12 Assistance Needed: +1 History of Present Illness: Patient is an 79 y/o female admitted with right sided weakness head CT positive for interparenchymal hemorrhage in the left parietal lobe felt to be hypertensive in nature.       Prior Functioning     Home Living Family/patient expects to be discharged to:: Private residence Living Arrangements: Other relatives (pt reports she lives with family) Available Help at Discharge:  Other (Comment) (pt unable to answer due to  cognitive deficits) Type of Home: House Home Access: Stairs to enter [employer] of Steps: 2-3 steps  Entrance Stairs-Rails: None Home Layout: Two level;Bed/bath upstairs Alternate Level Stairs-Number of Steps: one flight has one rail but could not specificy which side Home Equipment: Walker - 2 wheels Additional Comments: Pt not clear regarding level of assist she has from family at home (24/7 vs intermittent). Prior Function Level of Independence: Needs assistance Comments: pt reports family assists her in ADLs and mobility at home Communication Communication: No difficulties         Vision/Perception Vision - History Baseline Vision: Bifocals Patient Visual Report: No change from baseline Vision - Assessment Additional Comments: Pt wearing bifocals on OT arrival but states "I'm not sure who these belong to, I just found them."  Pt reports vision is at baseline. Difficult to assess due to pt's cognition.   Cognition  Cognition Arousal/Alertness: Awake/alert Behavior During Therapy: WFL for tasks assessed/performed Overall Cognitive Status: History of cognitive impairments - at baseline    Extremity/Trunk Assessment Upper Extremity Assessment Upper Extremity Assessment: Overall WFL for tasks assessed;Difficult to assess due to impaired cognition Lower Extremity Assessment Lower Extremity Assessment: Difficult to assess due to impaired cognition (pt able to move bil LEs against gravity: 3/5)     Mobility Bed Mobility Details for Bed Mobility Assistance: pt ambulating with PT on arrival Transfers Transfers: Sit to Stand;Stand to Sit Sit to Stand: 4: Min guard;From chair/3-in-1 Stand to Sit: 4: Min guard;To chair/3-in-1 Details for Transfer Assistance: Min guard to ensure safety.     Exercise     Balance     End of Session OT - End of Session Equipment Utilized During Treatment: Gait belt;Rolling walker Activity Tolerance: Patient tolerated treatment  well Patient left: in chair;with call bell/phone within reach;with nursing/sitter in room Nurse Communication: Mobility status  GO   12/19/2012 Cipriano Mile OTR/L Pager [phone] Office [phone]   Cipriano Mile 12/19/2012, 3:51 PM

## 2012-12-19 NOTE — Consult Note (Addendum)
NEURO HOSPITALIST CONSULT NOTE    Reason for Consult: AMS/delerium  HPI:                                                                                                                                         Majority of history obtained from chart.  [name] is an 79 y.o. female with 3-4 weeks of worsening memory and one week of hallucinations, disorientation, activation and wandering behavior. The patient is accompanied by her daughter who lives with her. She reports that for the past 5 days the patient has been awake almost constantly. She has been trying to leave the house in the middle of the night to pay bills, get her car fixed and do errands. She has been having visual hallucinations of worms, bugs, children. She has torn at the carpet in her room because of the hallucinations. She had been taking xanax at bedtime for sleep prior to last week, since symptoms started family giving xanax TID with no real change.  At time of consultation patient is drowsy and falling asleep when not spoken to.  She denies hallucinations but also believes she is at home even when she is shown her environment.  Initially she did not get date, year correct but later was able to state 2014 abut still believed it was September.  She denies living with her daughter and states she lives with her parents. She is able to follow all my commands easily and only shows a slight drift on the right arm (old).    Past Medical History  Diagnosis Date  . Memory loss 06/13/2009  . Irritable bowel syndrome 06/13/2009  . MIGRAINE HEADACHE 06/13/2009  . OSTEOARTHRITIS 06/13/2009  . HYPERTENSION 06/13/2009  . GERD Sept 2012    [facility], pH and manometry.. hiatal hernia present, resting pressure of LES hypotensive. normal peristalsis esophageal contractions. DeMeester score 0.2. Excellent control of GERD on PPI.   Marland Kitchen HYPERCHOLESTEROLEMIA 06/13/2009  . DM 06/13/2009    Past Surgical History   Procedure Laterality Date  . Appendectomy      40+ years ago  . Esophagogastroduodenoscopy  03/30/2003     [id number] ring otherwise normal esophagus/  Small hiatal hernia/ Normal stomach/ Status post dilation of the ring in an unusual manner  . Colonoscopy  03/30/2003    Poor prep precluded completion of the examination.  . Colonoscopy  June 2006    Dr. Elpidio Anis: normal  . 24 hour ph study  Sept 2012    DeMeester 0.2  . Esophageal manometry      hypotensive LES    Family History  Problem Relation Age of Onset  . Diabetes Father   . Diabetes Sister   . Colon cancer Brother     deceased at  age  67, diagnosed age late 46s.     Social History:  reports that she has never smoked. She does not have any smokeless tobacco history on file. She reports that she does not drink alcohol or use illicit drugs.  No Known Allergies  MEDICATIONS:                                                                                                                     Prior to Admission:  Prescriptions prior to admission  Medication Sig Dispense Refill  . acarbose (PRECOSE) 25 MG tablet Take 1 tablet (25 mg total) by mouth 3 (three) times daily with meals.  90 tablet  11  . acetaminophen (TYLENOL) 500 MG tablet Take 500 mg by mouth every 6 (six) hours as needed for pain.      Marland Kitchen ALPRAZolam (XANAX) 0.5 MG tablet Take 0.5 mg by mouth 3 (three) times daily.      . bromocriptine (PARLODEL) 2.5 MG tablet Take 1 tablet (2.5 mg total) by mouth at bedtime.  30 tablet  11  . Cholecalciferol (VITAMIN D) 2000 UNITS CAPS Take 2,000 Units by mouth daily.       Marland Kitchen esomeprazole (NEXIUM) 40 MG capsule Take 1 capsule (40 mg total) by mouth 2 times daily at 12 noon and 4 pm.  60 capsule  5  . imipramine (TOFRANIL) 10 MG tablet Take 40 mg by mouth at bedtime.       Marland Kitchen linagliptin (TRADJENTA) 5 MG TABS tablet Take 1 tablet (5 mg total) by mouth daily.  30 tablet  6  . losartan (COZAAR) 50 MG tablet Take 1 tablet (50  mg total) by mouth daily.  30 tablet  4  . metFORMIN (GLUCOPHAGE) 500 MG tablet Take 1 tablet (500 mg total) by mouth 2 (two) times daily with a meal.  60 tablet  11  . polyethylene glycol powder (GLYCOLAX/MIRALAX) powder Take 17 g by mouth daily. For constipation. Hold if loose stools.  255 g  3  . simvastatin (ZOCOR) 40 MG tablet Take 1 tablet (40 mg total) by mouth at bedtime.  30 tablet  11  . sucralfate (CARAFATE) 1 GM/10ML suspension Take 10 mLs (1 g total) by mouth 4 (four) times daily.  420 mL  1  . verapamil (VERELAN PM) 120 MG 24 hr capsule Take 120 mg by mouth daily.        Scheduled: . acarbose  25 mg Oral TID WC  . aspirin  325 mg Oral Daily  . atorvastatin  20 mg Oral QHS  . cefTRIAXone (ROCEPHIN)  IV  1 g Intravenous Q24H  . clonazePAM  0.25 mg Oral BID  . heparin  5,000 Units Subcutaneous Q8H  . influenza vac split quadrivalent PF  0.5 mL Intramuscular Tomorrow-1000  . insulin aspart  0-9 Units Subcutaneous TID WC  . losartan  50 mg Oral Daily  . mirtazapine  7.5 mg Oral QHS  . pantoprazole  40 mg Oral Daily  .  polyethylene glycol  17 g Oral Daily  . sodium chloride  3 mL Intravenous Q12H  . sucralfate  1 g Oral QID  . verapamil  120 mg Oral Daily     ROS:                                                                                                                                       History obtained from the patient  General ROS: negative for - chills, fatigue, fever, night sweats, weight gain or weight loss Psychological ROS: negative for - behavioral disorder, hallucinations, memory difficulties, mood swings or suicidal ideation Ophthalmic ROS: negative for - blurry vision, double vision, eye pain or loss of vision ENT ROS: negative for - epistaxis, nasal discharge, oral lesions, sore throat, tinnitus or vertigo Allergy and Immunology ROS: negative for - hives or itchy/watery eyes Hematological and Lymphatic ROS: negative for - bleeding problems, bruising or  swollen lymph nodes Endocrine ROS: negative for - galactorrhea, hair pattern changes, polydipsia/polyuria or temperature intolerance Respiratory ROS: negative for - cough, hemoptysis, shortness of breath or wheezing Cardiovascular ROS: negative for - chest pain, dyspnea on exertion, edema or irregular heartbeat Gastrointestinal ROS: negative for - abdominal pain, diarrhea, hematemesis, nausea/vomiting or stool incontinence Genito-Urinary ROS: negative for - dysuria, hematuria, incontinence or urinary frequency/urgency Musculoskeletal ROS: negative for - joint swelling or muscular weakness Neurological ROS: as noted in HPI Dermatological ROS: negative for rash and skin lesion changes   Blood pressure 138/77, pulse 97, temperature 97.9 F (36.6 C), temperature source Oral, resp. rate 15, height 5\' 5"  (1.651 m), weight 48.308 kg (106 lb 8 oz), SpO2 100.00%.   Neurologic Examination:                                                                                                      Mental Status: Alert, not oriented to hospital--believes she is at home, date or year, thought content appropriate.  Speech fluent without evidence of aphasia.  Able to follow 3 step commands without difficulty.  Cranial Nerves: II: Discs flat bilaterally; Visual fields grossly normal, pupils equal, round, reactive to light and accommodation III,IV, VI: ptosis not present, extra-ocular motions intact bilaterally V,VII: smile symmetric, facial light touch sensation normal bilaterally VIII: hearing normal bilaterally IX,X: gag reflex present XI: bilateral shoulder shrug XII: midline tongue extension Motor: Right : Upper extremity   5/5    Left:     Upper extremity   5/5  Lower extremity  5/5     Lower extremity   5/5 Tone and bulk:normal tone throughout; no atrophy noted Sensory: Pinprick and light touch intact throughout, bilaterally Deep Tendon Reflexes:  Right: Upper Extremity   Left: Upper extremity    biceps (C-5 to C-6) 2/4   biceps (C-5 to C-6) 2/4 tricep (C7) 2/4    triceps (C7) 2/4 Brachioradialis (C6) 2/4  Brachioradialis (C6) 2/4  Lower Extremity Lower Extremity  quadriceps (L-2 to L-4) 2/4   quadriceps (L-2 to L-4) 2/4 Achilles (S1) 1/4   Achilles (S1) 1/4  Plantars: Right: downgoing   Left: downgoing Cerebellar: normal finger-to-nose,  normal heel-to-shin test Gait: slightly unstable, walks without assistance, no drifting or swaying.  CV: pulses palpable throughout    No results found for this basename: cbc, bmp, coags, chol, tri, ldl, hga1c    Results for orders placed during the hospital encounter of 12/17/12 (from the past 48 hour(s))  COMPREHENSIVE METABOLIC PANEL     Status: Abnormal   Collection Time    12/17/12  1:51 PM      Result Value Range   Sodium 136  135 - 145 mEq/L   Potassium 4.2  3.5 - 5.1 mEq/L   Chloride 103  96 - 112 mEq/L   CO2 22  19 - 32 mEq/L   Glucose, Bld 129 (*) 70 - 99 mg/dL   BUN 22  6 - 23 mg/dL   Creatinine, Ser 1.61  0.50 - 1.10 mg/dL   Calcium 9.5  8.4 - 09.6 mg/dL   Total Protein 7.3  6.0 - 8.3 g/dL   Albumin 3.8  3.5 - 5.2 g/dL   AST 18  0 - 37 U/L   ALT 15  0 - 35 U/L   Alkaline Phosphatase 43  39 - 117 U/L   Total Bilirubin 0.6  0.3 - 1.2 mg/dL   GFR calc non Af Amer 56 (*) >90 mL/min   GFR calc Af Amer 65 (*) >90 mL/min   Comment: (NOTE)     The eGFR has been calculated using the CKD EPI equation.     This calculation has not been validated in all clinical situations.     eGFR's persistently <90 mL/min signify possible Chronic Kidney     Disease.  CBC     Status: Abnormal   Collection Time    12/17/12  1:51 PM      Result Value Range   WBC 6.1  4.0 - 10.5 K/uL   RBC 4.05  3.87 - 5.11 MIL/uL   Hemoglobin 11.0 (*) 12.0 - 15.0 g/dL   HCT 04.5 (*) 40.9 - 81.1 %   MCV 82.0  78.0 - 100.0 fL   MCH 27.2  26.0 - 34.0 pg   MCHC 33.1  30.0 - 36.0 g/dL   RDW 91.4 (*) 78.2 - 95.6 %   Platelets 203  150 - 400 K/uL   GLUCOSE, CAPILLARY     Status: Abnormal   Collection Time    12/17/12  3:37 PM      Result Value Range   Glucose-Capillary 104 (*) 70 - 99 mg/dL  URINALYSIS, ROUTINE W REFLEX MICROSCOPIC     Status: Abnormal   Collection Time    12/17/12  3:48 PM      Result Value Range   Color, Urine YELLOW  YELLOW   APPearance CLEAR  CLEAR   Specific Gravity, Urine 1.022  1.005 - 1.030   pH 6.0  5.0 - 8.0   Glucose,  UA NEGATIVE  NEGATIVE mg/dL   Hgb urine dipstick NEGATIVE  NEGATIVE   Bilirubin Urine NEGATIVE  NEGATIVE   Ketones, ur NEGATIVE  NEGATIVE mg/dL   Protein, ur NEGATIVE  NEGATIVE mg/dL   Urobilinogen, UA 0.2  0.0 - 1.0 mg/dL   Nitrite NEGATIVE  NEGATIVE   Leukocytes, UA MODERATE (*) NEGATIVE  URINE MICROSCOPIC-ADD ON     Status: Abnormal   Collection Time    12/17/12  3:48 PM      Result Value Range   Squamous Epithelial / LPF RARE  RARE   WBC, UA 3-6  <3 WBC/hpf   RBC / HPF 0-2  <3 RBC/hpf   Bacteria, UA FEW (*) RARE  URINE CULTURE     Status: None   Collection Time    12/17/12  3:48 PM      Result Value Range   Specimen Description URINE, CLEAN CATCH     Special Requests NONE     Culture  Setup Time       Value: 12/17/2012 16:43     Performed at [facility] Count       Value: 4,000 COLONIES/ML     Performed at [facility]   Culture       Value: INSIGNIFICANT GROWTH     Performed at [facility]   Report Status 12/18/2012 FINAL    TSH     Status: None   Collection Time    12/17/12  7:43 PM      Result Value Range   TSH 0.821  0.350 - 4.500 uIU/mL   Comment: Performed at [facility]  HEMOGLOBIN A1C     Status: Abnormal   Collection Time    12/17/12  7:43 PM      Result Value Range   Hemoglobin A1C 7.3 (*) <5.7 %   Comment: (NOTE)                                                                               According to the ADA Clinical Practice Recommendations for 2011, when     HbA1c is used as a screening test:       >=6.5%   Diagnostic of Diabetes Mellitus               (if abnormal result is confirmed)     5.7-6.4%   Increased risk of developing Diabetes Mellitus     References:Diagnosis and Classification of Diabetes Mellitus,Diabetes     Care,2011,34(Suppl 1):S62-S69 and Standards of Medical Care in             Diabetes - 2011,Diabetes Care,2011,34 (Suppl 1):S11-S61.   Mean Plasma Glucose 163 (*) <117 mg/dL   Comment: Performed at [facility]  CBC     Status: Abnormal   Collection Time    12/17/12  8:07 PM      Result Value Range   WBC 5.7  4.0 - 10.5 K/uL   RBC 4.09  3.87 - 5.11 MIL/uL   Hemoglobin 11.5 (*) 12.0 - 15.0 g/dL   HCT 16.1 (*) 09.6 - 04.5 %   MCV 81.2  78.0 - 100.0 fL   MCH  28.1  26.0 - 34.0 pg   MCHC 34.6  30.0 - 36.0 g/dL   RDW 16.1 (*) 09.6 - 04.5 %   Platelets 218  150 - 400 K/uL  CREATININE, SERUM     Status: Abnormal   Collection Time    12/17/12  8:07 PM      Result Value Range   Creatinine, Ser 0.80  0.50 - 1.10 mg/dL   GFR calc non Af Amer 67 (*) >90 mL/min   GFR calc Af Amer 78 (*) >90 mL/min   Comment: (NOTE)     The eGFR has been calculated using the CKD EPI equation.     This calculation has not been validated in all clinical situations.     eGFR's persistently <90 mL/min signify possible Chronic Kidney     Disease.  MRSA PCR SCREENING     Status: None   Collection Time    12/18/12  6:30 AM      Result Value Range   MRSA by PCR NEGATIVE  NEGATIVE   Comment:            The GeneXpert MRSA Assay (FDA     approved for NASAL specimens     only), is one component of a     comprehensive MRSA colonization     surveillance program. It is not     intended to diagnose MRSA     infection nor to guide or     monitor treatment for     MRSA infections.  GLUCOSE, CAPILLARY     Status: Abnormal   Collection Time    12/18/12 11:34 AM      Result Value Range   Glucose-Capillary 222 (*) 70 - 99 mg/dL  GLUCOSE, CAPILLARY     Status: Abnormal   Collection  Time    12/18/12  4:09 PM      Result Value Range   Glucose-Capillary 128 (*) 70 - 99 mg/dL  GLUCOSE, CAPILLARY     Status: Abnormal   Collection Time    12/19/12  7:26 AM      Result Value Range   Glucose-Capillary 139 (*) 70 - 99 mg/dL    Dg Chest 2 View  40/11/8117   *RADIOLOGY REPORT*  Clinical Data: Shortness of breath, weakness, evaluate for pneumonia, initial encounter.  CHEST - 2 VIEW  Comparison: 10/01/2012; 09/19/2012; 01/10/2006  Findings:  Grossly unchanged cardiac silhouette and mediastinal contours with mild tortuosity of the thoracic aorta.  The lungs appear hyperexpanded with flattening of bilateral hemidiaphragms and mild diffuse slightly nodular thickening of the pulmonary interstitium. No focal airspace opacities.  No pleural effusion or pneumothorax. No evidence of edema.  Osteopenia with grossly unchanged mild compression deformity of a mid thoracic vertebral body.  IMPRESSION: Lung hyperexpansion without acute cardiopulmonary disease. Specifically, no definite evidence of pneumonia.   Original Report Authenticated By: Tacey Ruiz, MD   Ct Head Wo Contrast  12/17/2012   CLINICAL DATA:  Altered mental status, patient says she does not feel right, history of stroke, hypertension, diabetes, initial encounter  EXAM: CT HEAD WITHOUT CONTRAST  TECHNIQUE: Contiguous axial images were obtained from the base of the skull through the vertex without intravenous contrast.  COMPARISON:  10/01/2012  FINDINGS: Generalized atrophy.  Normal ventricular morphology.  No midline shift or mass effect.  Minimal small vessel chronic ischemic changes in deep cerebral white matter.  Small focus of encephalomalacia at a high left parietal gyrus at site of old hemorrhagic infarct.  No intracranial hemorrhage, mass lesion, or evidence of acute infarction.  No extra-axial fluid collections.  Minimal atherosclerotic calcification is skullbase.  Bones and sinuses unremarkable.  IMPRESSION: Small focus of  encephalomalacia at high left parietal lobe at site of old hemorrhagic infarct.  Atrophy with minimal small vessel chronic ischemic changes in deep cerebral white matter.  No acute intracranial abnormalities.   Electronically Signed   By: Ulyses Southward M.D.   On: 12/17/2012 17:14   Mr Laqueta Jean ZO Contrast  12/19/2012   CLINICAL DATA:  To earlier a.m. Hallucinations. Worsening memory. Intra cerebral hemorrhage in July of 2014.  EXAM: MRI HEAD WITHOUT AND WITH CONTRAST  TECHNIQUE: Multiplanar, multiecho pulse sequences of the brain and surrounding structures were obtained according to standard protocol without and with intravenous contrast  CONTRAST:  10mL MULTIHANCE GADOBENATE DIMEGLUMINE 529 MG/ML IV SOLN  COMPARISON:  Head CT 12/17/2012. MRI 09/19/2012.  FINDINGS: Diffusion imaging shows a punctate acute infarction within the cerebellar vermis. No other acute infarction.  There chronic small vessel changes throughout the pons. The cerebral hemispheres show chronic small vessel changes throughout the deep white matter with an old left parietal cortical hemorrhage that has involuted. No evidence of additional stroke or bleeding in that region. No mass lesion, hydrocephalus or extra-axial collection. No pituitary mass. No inflammatory sinus disease. No skull or skullbase lesion.  IMPRESSION: Newly seen punctate acute infarction within the cerebellar vermis.  Chronic small vessel disease elsewhere throughout the brain.  Previously seen left parietal hemorrhage has involuted without evidence of additional stroke or hemorrhage in that region.   Electronically Signed   By: Paulina Fusi M.D.   On: 12/19/2012 07:30    Assessment and plan discussed with with attending physician and they are in agreement.    Felicie Morn PA-C [facility] [phone]  12/19/2012, 10:46 AM    Assessment/Plan:  79 yo F with signs/symptoms of delirium in the setting of dementia. I feel that the midline cerebellar infarct  is incidental, and she feels that her gait is at baseline(uses walker).   Of note, she was on imipramine which has anticholinergic activity and could contribute to memory problems.   1. HgbA1c, fasting lipid panel 2. Frequent neuro checks 3. Echocardiogram 4. Carotid dopplers not needed as posterior circulation.  6. Prophylactic therapy-Antiplatelet med: Aspirin - dose 325mg  7. Risk factor modification 8. Telemetry monitoring 9. PT consult, OT consult 10. Agree with remeron.   Ritta Slot, MD [facility] [phone]  If 7pm- 7am, please page neurology on call at [phone].

## 2012-12-19 NOTE — Progress Notes (Signed)
[facility] PROGRESS NOTE  [name] GNF:[id number] DOB: January 29, 1929 DOA: 12/17/2012 PCP: Evlyn Courier, MD  Assessment/Plan:  1-Delirium, Hallucination: Was agitated overnight. MRI positive for small stroke, discussed with neuro that wouldn't  explain patient presentation. Chest x ray negative for PNA. I have consulted psych to help with medications. Continue to hold imipramine and bromocriptine. Appreciate psych recommendation. Xanax change to klonopin, risperidone PRN, and mirtazapine at HS. Will start ceftriaxone./   2-Diabetes: HB A 1 c at 7.3. Hold metformin while in the hospital.  SSI.  3-HTN: continue home meds.   4-HLD: continue statin.  5-Small Acute stroke in the cerebellar vermis: start aspirin. Neuro consulted. PT, OT. Speech evaluation.     Code Status: Full Family Communication: none at bedside.  Disposition Plan: need SNF   Consultants:  Psych  Procedures:  none  Antibiotics: ceftriaxone  HPI/Subjective: Patient in bed, no complaints, confuse. Was agitated overnight.   Objective: Filed Vitals:   12/18/12 1306  BP: 138/77  Pulse: 97  Temp: 97.9 F (36.6 C)  Resp: 15   No intake or output data in the 24 hours ending 12/19/12 0959 Filed Weights   12/17/12 1341 12/17/12 2013  Weight: 50.803 kg (112 lb) 48.308 kg (106 lb 8 oz)    Exam:   General: no distress.   Cardiovascular: S 1, S 2 RRR  Respiratory: CTA  Abdomen: BS present, nt  Musculoskeletal: no edema  Data Reviewed: Basic Metabolic Panel:  Recent Labs Lab 12/17/12 1351 12/17/12 2007  NA 136  --   K 4.2  --   CL 103  --   CO2 22  --   GLUCOSE 129*  --   BUN 22  --   CREATININE 0.93 0.80  CALCIUM 9.5  --    Liver Function Tests:  Recent Labs Lab 12/17/12 1351  AST 18  ALT 15  ALKPHOS 43  BILITOT 0.6  PROT 7.3  ALBUMIN 3.8   No results found for this basename: LIPASE, AMYLASE,  in the last 168 hours No results found for this basename: AMMONIA,  in the  last 168 hours CBC:  Recent Labs Lab 12/17/12 1351 12/17/12 2007  WBC 6.1 5.7  HGB 11.0* 11.5*  HCT 33.2* 33.2*  MCV 82.0 81.2  PLT 203 218   Cardiac Enzymes: No results found for this basename: CKTOTAL, CKMB, CKMBINDEX, TROPONINI,  in the last 168 hours BNP (last 3 results) No results found for this basename: PROBNP,  in the last 8760 hours CBG:  Recent Labs Lab 12/17/12 1537 12/18/12 1134 12/18/12 1609 12/19/12 0726  GLUCAP 104* 222* 128* 139*    Recent Results (from the past 240 hour(s))  URINE CULTURE     Status: None   Collection Time    12/17/12  3:48 PM      Result Value Range Status   Specimen Description URINE, CLEAN CATCH   Final   Special Requests NONE   Final   Culture  Setup Time     Final   Value: 12/17/2012 16:43     Performed at [facility] Count     Final   Value: 4,000 COLONIES/ML     Performed at [facility]   Culture     Final   Value: INSIGNIFICANT GROWTH     Performed at [facility]   Report Status 12/18/2012 FINAL   Final  MRSA PCR SCREENING     Status: None   Collection Time  12/18/12  6:30 AM      Result Value Range Status   MRSA by PCR NEGATIVE  NEGATIVE Final   Comment:            The GeneXpert MRSA Assay (FDA     approved for NASAL specimens     only), is one component of a     comprehensive MRSA colonization     surveillance program. It is not     intended to diagnose MRSA     infection nor to guide or     monitor treatment for     MRSA infections.     Studies: Dg Chest 2 View  12/17/2012   *RADIOLOGY REPORT*  Clinical Data: Shortness of breath, weakness, evaluate for pneumonia, initial encounter.  CHEST - 2 VIEW  Comparison: 10/01/2012; 09/19/2012; 01/10/2006  Findings:  Grossly unchanged cardiac silhouette and mediastinal contours with mild tortuosity of the thoracic aorta.  The lungs appear hyperexpanded with flattening of bilateral hemidiaphragms and mild diffuse slightly nodular  thickening of the pulmonary interstitium. No focal airspace opacities.  No pleural effusion or pneumothorax. No evidence of edema.  Osteopenia with grossly unchanged mild compression deformity of a mid thoracic vertebral body.  IMPRESSION: Lung hyperexpansion without acute cardiopulmonary disease. Specifically, no definite evidence of pneumonia.   Original Report Authenticated By: Tacey Ruiz, MD   Ct Head Wo Contrast  12/17/2012   CLINICAL DATA:  Altered mental status, patient says she does not feel right, history of stroke, hypertension, diabetes, initial encounter  EXAM: CT HEAD WITHOUT CONTRAST  TECHNIQUE: Contiguous axial images were obtained from the base of the skull through the vertex without intravenous contrast.  COMPARISON:  10/01/2012  FINDINGS: Generalized atrophy.  Normal ventricular morphology.  No midline shift or mass effect.  Minimal small vessel chronic ischemic changes in deep cerebral white matter.  Small focus of encephalomalacia at a high left parietal gyrus at site of old hemorrhagic infarct.  No intracranial hemorrhage, mass lesion, or evidence of acute infarction.  No extra-axial fluid collections.  Minimal atherosclerotic calcification is skullbase.  Bones and sinuses unremarkable.  IMPRESSION: Small focus of encephalomalacia at high left parietal lobe at site of old hemorrhagic infarct.  Atrophy with minimal small vessel chronic ischemic changes in deep cerebral white matter.  No acute intracranial abnormalities.   Electronically Signed   By: Ulyses Southward M.D.   On: 12/17/2012 17:14   Mr Laqueta Jean EA Contrast  12/19/2012   CLINICAL DATA:  To earlier a.m. Hallucinations. Worsening memory. Intra cerebral hemorrhage in July of 2014.  EXAM: MRI HEAD WITHOUT AND WITH CONTRAST  TECHNIQUE: Multiplanar, multiecho pulse sequences of the brain and surrounding structures were obtained according to standard protocol without and with intravenous contrast  CONTRAST:  10mL MULTIHANCE GADOBENATE  DIMEGLUMINE 529 MG/ML IV SOLN  COMPARISON:  Head CT 12/17/2012. MRI 09/19/2012.  FINDINGS: Diffusion imaging shows a punctate acute infarction within the cerebellar vermis. No other acute infarction.  There chronic small vessel changes throughout the pons. The cerebral hemispheres show chronic small vessel changes throughout the deep white matter with an old left parietal cortical hemorrhage that has involuted. No evidence of additional stroke or bleeding in that region. No mass lesion, hydrocephalus or extra-axial collection. No pituitary mass. No inflammatory sinus disease. No skull or skullbase lesion.  IMPRESSION: Newly seen punctate acute infarction within the cerebellar vermis.  Chronic small vessel disease elsewhere throughout the brain.  Previously seen left parietal hemorrhage has involuted without evidence  of additional stroke or hemorrhage in that region.   Electronically Signed   By: Paulina Fusi M.D.   On: 12/19/2012 07:30    Scheduled Meds: . acarbose  25 mg Oral TID WC  . aspirin  325 mg Oral Daily  . atorvastatin  20 mg Oral QHS  . clonazePAM  0.25 mg Oral BID  . heparin  5,000 Units Subcutaneous Q8H  . influenza vac split quadrivalent PF  0.5 mL Intramuscular Tomorrow-1000  . insulin aspart  0-9 Units Subcutaneous TID WC  . losartan  50 mg Oral Daily  . mirtazapine  7.5 mg Oral QHS  . pantoprazole  40 mg Oral Daily  . polyethylene glycol  17 g Oral Daily  . sodium chloride  3 mL Intravenous Q12H  . sucralfate  1 g Oral QID  . verapamil  120 mg Oral Daily   Continuous Infusions:   Active Problems:   DM   HYPERCHOLESTEROLEMIA   HYPERTENSION   GERD   Delirium   Hallucination    Time spent: 25 minutes    Edwar Coe  [facility] Pager [phone]. If 7PM-7AM, please contact night-coverage at [email or web address], password [facility] 12/19/2012, 9:59 AM  LOS: 2 days

## 2012-12-20 ENCOUNTER — Encounter (HOSPITAL_COMMUNITY): Payer: Self-pay

## 2012-12-20 DIAGNOSIS — R443 Hallucinations, unspecified: Secondary | ICD-10-CM

## 2012-12-20 DIAGNOSIS — I619 Nontraumatic intracerebral hemorrhage, unspecified: Secondary | ICD-10-CM

## 2012-12-20 DIAGNOSIS — R404 Transient alteration of awareness: Secondary | ICD-10-CM

## 2012-12-20 DIAGNOSIS — E440 Moderate protein-calorie malnutrition: Secondary | ICD-10-CM | POA: Insufficient documentation

## 2012-12-20 LAB — GLUCOSE, CAPILLARY
Glucose-Capillary: 200 mg/dL — ABNORMAL HIGH (ref 70–99)
Glucose-Capillary: 226 mg/dL — ABNORMAL HIGH (ref 70–99)
Glucose-Capillary: 173 mg/dL — ABNORMAL HIGH (ref 70–99)
Glucose-Capillary: 131 mg/dL — ABNORMAL HIGH (ref 70–99)

## 2012-12-20 MED ORDER — HALOPERIDOL LACTATE 5 MG/ML IJ SOLN
5.0000 mg | Freq: Once | INTRAMUSCULAR | Status: AC
  Administered 2012-12-20: 3 mg via INTRAVENOUS
  Filled 2012-12-20: qty 1

## 2012-12-20 MED ORDER — CLOPIDOGREL BISULFATE 75 MG PO TABS
75.0000 mg | ORAL_TABLET | Freq: Every day | ORAL | Status: DC
  Administered 2012-12-21 – 2012-12-24 (×4): 75 mg via ORAL
  Filled 2012-12-20 (×5): qty 1

## 2012-12-20 MED ORDER — GLUCERNA SHAKE PO LIQD
237.0000 mL | Freq: Two times a day (BID) | ORAL | Status: DC
  Administered 2012-12-20 – 2012-12-24 (×8): 237 mL via ORAL

## 2012-12-20 MED ORDER — HALOPERIDOL LACTATE 5 MG/ML IJ SOLN
2.0000 mg | Freq: Once | INTRAMUSCULAR | Status: AC
  Administered 2012-12-20: 2 mg via INTRAVENOUS
  Filled 2012-12-20: qty 1

## 2012-12-20 NOTE — Progress Notes (Signed)
Patient placed on telemetry, per order, momitoring for 24 hours. Placed on tele box 6N31, CCMD notified of placement and patient verification.

## 2012-12-20 NOTE — Progress Notes (Signed)
INITIAL NUTRITION ASSESSMENT  DOCUMENTATION CODES Per approved criteria  -Non-severe (moderate) malnutrition in the context of chronic illness -Underweight   INTERVENTION: 1. Glucerna Shake po BID, each supplement provides 220 kcal and 10 grams of protein.   NUTRITION DIAGNOSIS: Unintentional weight loss related to advanced age as evidenced by weight hx.   Goal: Intake to meet >/=90% estimated nutrition needs  Monitor:  PO intake, weight trends, labs, I/O's  Reason for Assessment: Malnutrition Screening Tool  79 y.o. female  Admitting Dx: delirium and hallucination   ASSESSMENT: Pt admitted with lack of sleep for 3 days and delirium. Found with midline cerebellar infarct. Pt had some increased agitation earlier and required Haldol, now pt sleepy and able to provide limited hx given baseline memory loss. States she eats well at home. No family in room at time of RD visit. Malnutrition screening tool score indicates someone reported poor appetite. Sitter present, states she ate very well for breakfast but not until late in the morning so has not yet been hungry for lunch.   Pt weight hx shows 15 lb weight loss (12% body weight loss) in the past 6 months.   Limited nutrition focused physical exam completed: Mild muscle wasting noted in the temples, mild fat wasting present in orbital region.   Height: Ht Readings from Last 1 Encounters:  12/17/12 5\' 5"  (1.651 m)    Weight: Wt Readings from Last 1 Encounters:  12/17/12 106 lb 8 oz (48.308 kg)    Ideal Body Weight: 125 lbs   % Ideal Body Weight: 85%  Wt Readings from Last 10 Encounters:  12/17/12 106 lb 8 oz (48.308 kg)  09/18/12 111 lb 8.8 oz (50.6 kg)  08/25/12 113 lb (51.256 kg)  06/25/12 121 lb (54.885 kg)  05/21/12 121 lb (54.885 kg)  04/02/12 120 lb 12.8 oz (54.795 kg)  02/13/12 119 lb (53.978 kg)  01/01/12 122 lb 12.8 oz (55.702 kg)  08/31/11 113 lb (51.256 kg)  04/12/11 126 lb 3.2 oz (57.244 kg)     Usual Body Weight: 121 lbs   % Usual Body Weight: 88%  BMI:  Body mass index is 17.72 kg/(m^2). underweight   Estimated Nutritional Needs: Kcal: 1100-1300 Protein: 50-60 gm  Fluid: >/= 1.5 L   Skin: intact   Diet Order: Carb Control  EDUCATION NEEDS: -No education needs identified at this time   Intake/Output Summary (Last 24 hours) at 12/20/12 1325 Last data filed at 12/20/12 0930  Gross per 24 hour  Intake    960 ml  Output      0 ml  Net    960 ml    Last BM: PTA   Labs:   Recent Labs Lab 12/17/12 1351 12/17/12 2007  NA 136  --   K 4.2  --   CL 103  --   CO2 22  --   BUN 22  --   CREATININE 0.93 0.80  CALCIUM 9.5  --   GLUCOSE 129*  --     CBG (last 3)   Recent Labs  12/19/12 1612 12/19/12 2213 12/20/12 1216  GLUCAP 180* 200* 226*    Scheduled Meds: . acarbose  25 mg Oral TID WC  . aspirin  325 mg Oral Daily  . atorvastatin  20 mg Oral QHS  . cefTRIAXone (ROCEPHIN)  IV  1 g Intravenous Q24H  . clonazePAM  0.25 mg Oral BID  . heparin  5,000 Units Subcutaneous Q8H  . insulin aspart  0-9 Units Subcutaneous  TID WC  . losartan  50 mg Oral Daily  . mirtazapine  7.5 mg Oral QHS  . pantoprazole  40 mg Oral Daily  . polyethylene glycol  17 g Oral Daily  . sodium chloride  3 mL Intravenous Q12H  . sucralfate  1 g Oral QID  . verapamil  120 mg Oral Daily    Continuous Infusions:   Past Medical History  Diagnosis Date  . Memory loss 06/13/2009  . Irritable bowel syndrome 06/13/2009  . MIGRAINE HEADACHE 06/13/2009  . OSTEOARTHRITIS 06/13/2009  . HYPERTENSION 06/13/2009  . GERD Sept 2012    [facility], pH and manometry.. hiatal hernia present, resting pressure of LES hypotensive. normal peristalsis esophageal contractions. DeMeester score 0.2. Excellent control of GERD on PPI.   Marland Kitchen HYPERCHOLESTEROLEMIA 06/13/2009  . DM 06/13/2009    Past Surgical History  Procedure Laterality Date  . Appendectomy      40+ years ago  .  Esophagogastroduodenoscopy  03/30/2003     WUJ:[id number] ring otherwise normal esophagus/  Small hiatal hernia/ Normal stomach/ Status post dilation of the ring in an unusual manner  . Colonoscopy  03/30/2003    Poor prep precluded completion of the examination.  . Colonoscopy  June 2006    Dr. Elpidio Anis: normal  . 24 hour ph study  Sept 2012    DeMeester 0.2  . Esophageal manometry      hypotensive LES    Isabell Jarvis RD, LDN Pager [phone] After Hours pager [phone]

## 2012-12-20 NOTE — Progress Notes (Signed)
[facility] PROGRESS NOTE  [name] HYQ:[id number] DOB: 1928/12/31 DOA: 12/17/2012 PCP: Evlyn Courier, MD  Assessment/Plan:  1-Delirium, Hallucination: MRI positive for small stroke, discussed with neuro that wouldn't  explain patient presentation. Chest x ray negative for PNA. I have consulted psych to help with medications. Continue to hold imipramine and bromocriptine. Appreciate psych recommendation. Xanax change to klonopin, risperidone PRN for agitation, hallucination. , and mirtazapine at HS.  -UA no significant bacteria growth. Will discontinue ceftriaxone.   2-Diabetes: HB A 1 c at 7.3. Hold metformin while in the hospital.  SSI.  3-HTN: continue home meds.   4-HLD: continue statin.  5-Small Acute stroke in the cerebellar vermis: Continue with  aspirin. Neuro recommend echo, no need for carotid doppler.     Code Status: Full Family Communication: none at bedside.  Disposition Plan: need SNF   Consultants:  Psych  Procedures:  none  Antibiotics: ceftriaxone  HPI/Subjective: Patient in bed, no complaints, confuse. Was agitated overnight again.    Objective: Filed Vitals:   12/20/12 0911  BP: 117/71  Pulse: 88  Temp: 98.8 F (37.1 C)  Resp: 20    Intake/Output Summary (Last 24 hours) at 12/20/12 1336 Last data filed at 12/20/12 0930  Gross per 24 hour  Intake    960 ml  Output      0 ml  Net    960 ml   Filed Weights   12/17/12 1341 12/17/12 2013  Weight: 50.803 kg (112 lb) 48.308 kg (106 lb 8 oz)    Exam:   General: no distress.   Cardiovascular: S 1, S 2 RRR  Respiratory: CTA  Abdomen: BS present, nt  Musculoskeletal: no edema  Data Reviewed: Basic Metabolic Panel:  Recent Labs Lab 12/17/12 1351 12/17/12 2007  NA 136  --   K 4.2  --   CL 103  --   CO2 22  --   GLUCOSE 129*  --   BUN 22  --   CREATININE 0.93 0.80  CALCIUM 9.5  --    Liver Function Tests:  Recent Labs Lab 12/17/12 1351  AST 18  ALT 15   ALKPHOS 43  BILITOT 0.6  PROT 7.3  ALBUMIN 3.8   No results found for this basename: LIPASE, AMYLASE,  in the last 168 hours No results found for this basename: AMMONIA,  in the last 168 hours CBC:  Recent Labs Lab 12/17/12 1351 12/17/12 2007  WBC 6.1 5.7  HGB 11.0* 11.5*  HCT 33.2* 33.2*  MCV 82.0 81.2  PLT 203 218   Cardiac Enzymes: No results found for this basename: CKTOTAL, CKMB, CKMBINDEX, TROPONINI,  in the last 168 hours BNP (last 3 results) No results found for this basename: PROBNP,  in the last 8760 hours CBG:  Recent Labs Lab 12/19/12 0726 12/19/12 1134 12/19/12 1612 12/19/12 2213 12/20/12 1216  GLUCAP 139* 125* 180* 200* 226*    Recent Results (from the past 240 hour(s))  URINE CULTURE     Status: None   Collection Time    12/17/12  3:48 PM      Result Value Range Status   Specimen Description URINE, CLEAN CATCH   Final   Special Requests NONE   Final   Culture  Setup Time     Final   Value: 12/17/2012 16:43     Performed at [facility] Count     Final   Value: 4,000 COLONIES/ML     Performed at  [facility]     Final   Value: INSIGNIFICANT GROWTH     Performed at [facility]   Report Status 12/18/2012 FINAL   Final  MRSA PCR SCREENING     Status: None   Collection Time    12/18/12  6:30 AM      Result Value Range Status   MRSA by PCR NEGATIVE  NEGATIVE Final   Comment:            The GeneXpert MRSA Assay (FDA     approved for NASAL specimens     only), is one component of a     comprehensive MRSA colonization     surveillance program. It is not     intended to diagnose MRSA     infection nor to guide or     monitor treatment for     MRSA infections.     Studies: Mr Lodema Pilot Contrast  2013-01-15   CLINICAL DATA:  To earlier a.m. Hallucinations. Worsening memory. Intra cerebral hemorrhage in July of 2014.  EXAM: MRI HEAD WITHOUT AND WITH CONTRAST  TECHNIQUE: Multiplanar, multiecho pulse  sequences of the brain and surrounding structures were obtained according to standard protocol without and with intravenous contrast  CONTRAST:  10mL MULTIHANCE GADOBENATE DIMEGLUMINE 529 MG/ML IV SOLN  COMPARISON:  Head CT 12/17/2012. MRI 09/19/2012.  FINDINGS: Diffusion imaging shows a punctate acute infarction within the cerebellar vermis. No other acute infarction.  There chronic small vessel changes throughout the pons. The cerebral hemispheres show chronic small vessel changes throughout the deep white matter with an old left parietal cortical hemorrhage that has involuted. No evidence of additional stroke or bleeding in that region. No mass lesion, hydrocephalus or extra-axial collection. No pituitary mass. No inflammatory sinus disease. No skull or skullbase lesion.  IMPRESSION: Newly seen punctate acute infarction within the cerebellar vermis.  Chronic small vessel disease elsewhere throughout the brain.  Previously seen left parietal hemorrhage has involuted without evidence of additional stroke or hemorrhage in that region.   Electronically Signed   By: Paulina Fusi M.D.   On: 2013/01/15 07:30    Scheduled Meds: . acarbose  25 mg Oral TID WC  . aspirin  325 mg Oral Daily  . atorvastatin  20 mg Oral QHS  . cefTRIAXone (ROCEPHIN)  IV  1 g Intravenous Q24H  . clonazePAM  0.25 mg Oral BID  . heparin  5,000 Units Subcutaneous Q8H  . insulin aspart  0-9 Units Subcutaneous TID WC  . losartan  50 mg Oral Daily  . mirtazapine  7.5 mg Oral QHS  . pantoprazole  40 mg Oral Daily  . polyethylene glycol  17 g Oral Daily  . sodium chloride  3 mL Intravenous Q12H  . sucralfate  1 g Oral QID  . verapamil  120 mg Oral Daily   Continuous Infusions:   Active Problems:   DM   HYPERCHOLESTEROLEMIA   HYPERTENSION   GERD   Delirium   Hallucination    Time spent: 25 minutes    [name],Courtney  [facility] Pager [phone]. If 7PM-7AM, please contact night-coverage at [email or web address],  password [facility] 12/20/2012, 1:36 PM  LOS: 3 days

## 2012-12-20 NOTE — Progress Notes (Signed)
Subjective: Patient is asleep she apparently became quite agitated overnight  Exam: Filed Vitals:   12/20/12 1425  BP: 139/72  Pulse: 82  Temp: 97.5 F (36.4 C)  Resp: 16   Gen: In bed, NAD MS: Asleep, once aroused is able to answer questions, states that she is at home. CN: Pupils equal round and reactive to light, extraocular movements intact Motor: Moves all extremities well Cerebellar: No clear ataxia on finger nose finger Sensory: Intact to light touch  Impression: 79 year old female with tiny infarction in the midline cerebellum. I suspect that this is unrelated to her delirium, which I suspect is more related to dementia. She was on several medications which could be contributory, which are being held. She appears to have her days and nights reversed, and low-dose at atypical antipsychotic at night can be helpful in these situations.  Recommendations: 1) could consider low-dose antipsychotic at night 2) would change from aspirin to Plavix for secondary stroke prevention 3) lipid panel ordered, if LDL is greater than 100 advance statin therapy 4) no signs of embolic source on cardiac echo 5) neurology will sign off at this time, please call with any further questions.  Ritta Slot, MD [facility] [phone]  If 7pm- 7am, please page neurology on call at [phone].

## 2012-12-21 LAB — LIPID PANEL
Cholesterol: 168 mg/dL (ref 0–200)
HDL: 55 mg/dL (ref >39)
LDL Cholesterol: 99 mg/dL (ref 0–99)
Total CHOL/HDL Ratio: 3.1 RATIO

## 2012-12-21 LAB — GLUCOSE, CAPILLARY
Glucose-Capillary: 249 mg/dL — ABNORMAL HIGH (ref 70–99)
Glucose-Capillary: 175 mg/dL — ABNORMAL HIGH (ref 70–99)

## 2012-12-21 MED ORDER — RISPERIDONE 0.5 MG PO TABS
0.5000 mg | ORAL_TABLET | Freq: Every day | ORAL | Status: DC
  Administered 2012-12-21 – 2012-12-23 (×3): 0.5 mg via ORAL
  Filled 2012-12-21 (×4): qty 1

## 2012-12-21 MED ORDER — MIRTAZAPINE 15 MG PO TABS
15.0000 mg | ORAL_TABLET | Freq: Every day | ORAL | Status: DC
  Administered 2012-12-21 – 2012-12-23 (×3): 15 mg via ORAL
  Filled 2012-12-21 (×4): qty 1

## 2012-12-21 MED ORDER — RISPERIDONE 0.25 MG PO TABS
0.2500 mg | ORAL_TABLET | Freq: Once | ORAL | Status: DC
  Filled 2012-12-21: qty 1

## 2012-12-21 MED ORDER — HALOPERIDOL LACTATE 5 MG/ML IJ SOLN
2.0000 mg | Freq: Once | INTRAMUSCULAR | Status: AC
  Administered 2012-12-21: 2 mg via INTRAVENOUS
  Filled 2012-12-21: qty 1

## 2012-12-21 MED ORDER — RISPERIDONE 0.5 MG PO TABS: 0.5000 mg | ORAL_TABLET | Freq: Two times a day (BID) | ORAL | Status: DC

## 2012-12-21 MED ORDER — HALOPERIDOL 1 MG PO TABS
1.0000 mg | ORAL_TABLET | Freq: Once | ORAL | Status: DC
  Filled 2012-12-21: qty 1

## 2012-12-21 NOTE — Progress Notes (Signed)
Patient is sitting in chair, appears to be calm with sitter near by.

## 2012-12-21 NOTE — Consult Note (Signed)
Reason for Consult: Hallucinations and cognitive decline  Referring Physician: Dr. [name] is an 79 y.o. female.  HPI: This is a psychiatric consultation followup note. Psychiatry consult followup requested as patient has been struggling with the sundown syndrome means that he needed to be agitated at nighttime. Patient is also confused and wandering into different patients rooms required medication management. Recommended increase the dose of Remeron and also risperidone at night time to prevent agitation and insomnia. Patient frequently coming out of her room and searching for something, she does not know what it is.  Reportedly patient presents with 3-4 weeks of worsening memory and one week of hallucinations, disorientation, activation and wandering behavior. The patient daughter who lives with her, reports that for the past 5 days the patient has been awake almost constantly. She has been trying to leave the house in the middle of the night to pay bills, get her car fixed and do errands. She has been having visual hallucinations of worms, bugs, children. She has torn at the carpet in her room because of the hallucinations. Patient medication bromocriptine and imipramine was stopped on admission  to reduce confusion. She had been taking xanax at bedtime for sleep prior to last week, since symptoms started family giving xanax TID with no real change. Patient has a history of intercerebral hemorrhage in 09/2012, mild cognitive decline at baseline, hypertension, and DM.  Mental Status Examination: Patient appeared as per his stated age, dressed in hospital gown, and fairly groomed. Patient is calm, quiet and cooperative. Patient is getting along with the staff and sitter during daytime. Patient having confusion and memory deficits. Patient does not remember seeing this provider 2 days ago.  Past Medical History  Diagnosis Date  . Memory loss 06/13/2009  . Irritable bowel syndrome  06/13/2009  . MIGRAINE HEADACHE 06/13/2009  . OSTEOARTHRITIS 06/13/2009  . HYPERTENSION 06/13/2009  . GERD Sept 2012    [facility], pH and manometry.. hiatal hernia present, resting pressure of LES hypotensive. normal peristalsis esophageal contractions. DeMeester score 0.2. Excellent control of GERD on PPI.   [facility] Kitchen HYPERCHOLESTEROLEMIA 06/13/2009  . DM 06/13/2009    Past Surgical History  Procedure Laterality Date  . Appendectomy      40+ years ago  . Esophagogastroduodenoscopy  03/30/2003     [id number] ring otherwise normal esophagus/  Small hiatal hernia/ Normal stomach/ Status post dilation of the ring in an unusual manner  . Colonoscopy  03/30/2003    Poor prep precluded completion of the examination.  . Colonoscopy  June 2006    Dr. Elpidio Anis: normal  . 24 hour ph study  Sept 2012    DeMeester 0.2  . Esophageal manometry      hypotensive LES    Family History  Problem Relation Age of Onset  . Diabetes Father   . Diabetes Sister   . Colon cancer Brother     deceased at age  17, diagnosed age late 44s.     Social History:  reports that she has never smoked. She does not have any smokeless tobacco history on file. She reports that she does not drink alcohol or use illicit drugs.  Allergies: No Known Allergies  Medications: I have reviewed the patient's current medications.  Results for orders placed during the hospital encounter of 12/17/12 (from the past 48 hour(s))  GLUCOSE, CAPILLARY     Status: Abnormal   Collection Time    12/19/12  4:12 PM  Result Value Range   Glucose-Capillary 180 (*) 70 - 99 mg/dL  GLUCOSE, CAPILLARY     Status: Abnormal   Collection Time    12/19/12 10:13 PM      Result Value Range   Glucose-Capillary 200 (*) 70 - 99 mg/dL  GLUCOSE, CAPILLARY     Status: Abnormal   Collection Time    12/20/12 12:16 PM      Result Value Range   Glucose-Capillary 226 (*) 70 - 99 mg/dL   Comment 1 Notify RN    GLUCOSE, CAPILLARY      Status: Abnormal   Collection Time    12/20/12  5:14 PM      Result Value Range   Glucose-Capillary 173 (*) 70 - 99 mg/dL   Comment 1 Notify RN    GLUCOSE, CAPILLARY     Status: Abnormal   Collection Time    12/20/12  9:22 PM      Result Value Range   Glucose-Capillary 131 (*) 70 - 99 mg/dL  LIPID PANEL     Status: None   Collection Time    12/21/12  5:49 AM      Result Value Range   Cholesterol 168  0 - 200 mg/dL   Triglycerides 70  <213 mg/dL   HDL 55  >08 mg/dL   Total CHOL/HDL Ratio 3.1     VLDL 14  0 - 40 mg/dL   LDL Cholesterol 99  0 - 99 mg/dL   Comment:            Total Cholesterol/HDL:CHD Risk     Coronary Heart Disease Risk Table                         Men   Women      1/2 Average Risk   3.4   3.3      Average Risk       5.0   4.4      2 X Average Risk   9.6   7.1      3 X Average Risk  23.4   11.0                Use the calculated Patient Ratio     above and the CHD Risk Table     to determine the patient's CHD Risk.                ATP III CLASSIFICATION (LDL):      <100     mg/dL   Optimal      657-846  mg/dL   Near or Above                        Optimal      130-159  mg/dL   Borderline      962-952  mg/dL   High      >841     mg/dL   Very High  GLUCOSE, CAPILLARY     Status: Abnormal   Collection Time    12/21/12  8:06 AM      Result Value Range   Glucose-Capillary 249 (*) 70 - 99 mg/dL   Comment 1 Notify RN    GLUCOSE, CAPILLARY     Status: Abnormal   Collection Time    12/21/12 12:27 PM      Result Value Range   Glucose-Capillary 162 (*) 70 - 99 mg/dL   Comment 1 Notify  RN      No results found.  Positive for sleep disturbance and Hallucinations and cognitive decline Blood pressure 126/69, pulse 107, temperature 98.2 F (36.8 C), temperature source Oral, resp. rate 16, height 5\' 5"  (1.651 m), weight 48.308 kg (106 lb 8 oz), SpO2 100.00%.   Assessment/Plan: Delirium with hallucinations  Patient needed placement with a minimally care  unit  Case discussed with her staff RN Change Risperidone 0.5 mg PO each bedtime Continue Klonopin 0.25 mg PO BID Increase Remeron 15 mg PO Qhs Monitor for adverse effects Appreciate psych consult and follow up as needed.  [name],JANARDHAHA R. 12/21/2012, 3:24 PM

## 2012-12-21 NOTE — Progress Notes (Signed)
Patient became agitated this afternoon while her daughter was visiting. Patient was pacing around the room, taking the sheets off her bed and repeatedly stating, "I want to go home." Patient's daughter requested to have the doctor called to give medication that would calm her down. Called doctor, received an order to give 2200 dose 0.25mg  Klonopin early, which was at 1640 this afternoon. Daughter informed nurse that the patient received a call from a family friend and once she got off the phone, she became agitated. Daughter believes any contact from this person is not beneficial to the patient. The person the daughter was referring to, in particular,  Name is Vernie Shanks. The daughter requested the patient be placed under protected identity. Discussed with daughter the process and expectations of placing patient under this status, daughter verbalized she understood. Daughter has no further questions at this time.

## 2012-12-21 NOTE — Progress Notes (Signed)
[facility] PROGRESS NOTE  [name] VWU:[id number] DOB: 07/05/1928 DOA: 12/17/2012 PCP: Evlyn Courier, MD  Assessment/Plan:  1-Delirium, Hallucination: MRI positive for small stroke, discussed with neuro that wouldn't  explain patient presentation. Chest x ray negative for PNA. I have consulted psych to help with medications. Continue to hold imipramine and bromocriptine. Appreciate psych recommendation. Xanax change to klonopin, risperidone PRN for agitation, hallucination. , and mirtazapine at HS.  -UA no significant bacteria growth. Will discontinue ceftriaxone.  -Patient with agitation only at nights. Will ask Psych to evaluate and adjust medications.   2-Diabetes: HB A 1 c at 7.3. Hold metformin while in the hospital.  SSI.  3-HTN: continue home meds.   4-HLD: continue statin.  5-Small Acute stroke in the cerebellar vermis: Continue with  aspirin. Neuro recommend echo, no need for carotid doppler.     Code Status: Full Family Communication: none at bedside.  Disposition Plan: need SNF   Consultants:  Psych  Procedures:  none  Antibiotics: ceftriaxone  HPI/Subjective: Patient pleasant this morning. She has problem at night with agitation.   Objective: Filed Vitals:   12/21/12 0919  BP: 139/74  Pulse: 105  Temp: 98.3 F (36.8 C)  Resp:     Intake/Output Summary (Last 24 hours) at 12/21/12 1303 Last data filed at 12/21/12 0900  Gross per 24 hour  Intake   1320 ml  Output      0 ml  Net   1320 ml   Filed Weights   12/17/12 1341 12/17/12 2013  Weight: 50.803 kg (112 lb) 48.308 kg (106 lb 8 oz)    Exam:   General: no distress.   Cardiovascular: S 1, S 2 RRR  Respiratory: CTA  Abdomen: BS present, nt  Musculoskeletal: no edema  Data Reviewed: Basic Metabolic Panel:  Recent Labs Lab 12/17/12 1351 12/17/12 2007  NA 136  --   K 4.2  --   CL 103  --   CO2 22  --   GLUCOSE 129*  --   BUN 22  --   CREATININE 0.93 0.80  CALCIUM  9.5  --    Liver Function Tests:  Recent Labs Lab 12/17/12 1351  AST 18  ALT 15  ALKPHOS 43  BILITOT 0.6  PROT 7.3  ALBUMIN 3.8   No results found for this basename: LIPASE, AMYLASE,  in the last 168 hours No results found for this basename: AMMONIA,  in the last 168 hours CBC:  Recent Labs Lab 12/17/12 1351 12/17/12 2007  WBC 6.1 5.7  HGB 11.0* 11.5*  HCT 33.2* 33.2*  MCV 82.0 81.2  PLT 203 218   Cardiac Enzymes: No results found for this basename: CKTOTAL, CKMB, CKMBINDEX, TROPONINI,  in the last 168 hours BNP (last 3 results) No results found for this basename: PROBNP,  in the last 8760 hours CBG:  Recent Labs Lab 12/20/12 1216 12/20/12 1714 12/20/12 2122 12/21/12 0806 12/21/12 1227  GLUCAP 226* 173* 131* 249* 162*    Recent Results (from the past 240 hour(s))  URINE CULTURE     Status: None   Collection Time    12/17/12  3:48 PM      Result Value Range Status   Specimen Description URINE, CLEAN CATCH   Final   Special Requests NONE   Final   Culture  Setup Time     Final   Value: 12/17/2012 16:43     Performed at [facility] Count  Final   Value: 4,000 COLONIES/ML     Performed at [facility]   Culture     Final   Value: INSIGNIFICANT GROWTH     Performed at [facility]   Report Status 12/18/2012 FINAL   Final  MRSA PCR SCREENING     Status: None   Collection Time    12/18/12  6:30 AM      Result Value Range Status   MRSA by PCR NEGATIVE  NEGATIVE Final   Comment:            The GeneXpert MRSA Assay (FDA     approved for NASAL specimens     only), is one component of a     comprehensive MRSA colonization     surveillance program. It is not     intended to diagnose MRSA     infection nor to guide or     monitor treatment for     MRSA infections.     Studies: No results found.  Scheduled Meds: . acarbose  25 mg Oral TID WC  . atorvastatin  20 mg Oral QHS  . clonazePAM  0.25 mg Oral BID  .  clopidogrel  75 mg Oral Q breakfast  . feeding supplement  237 mL Oral BID BM  . heparin  5,000 Units Subcutaneous Q8H  . insulin aspart  0-9 Units Subcutaneous TID WC  . losartan  50 mg Oral Daily  . mirtazapine  7.5 mg Oral QHS  . pantoprazole  40 mg Oral Daily  . polyethylene glycol  17 g Oral Daily  . risperiDONE  0.25 mg Oral Once  . sodium chloride  3 mL Intravenous Q12H  . sucralfate  1 g Oral QID  . verapamil  120 mg Oral Daily   Continuous Infusions:   Active Problems:   DM   HYPERCHOLESTEROLEMIA   HYPERTENSION   GERD   Delirium   Hallucination   Malnutrition of moderate degree    Time spent: 25 minutes    [name]  [facility] Pager [phone]. If 7PM-7AM, please contact night-coverage at [email or web address], password [facility] 12/21/2012, 1:03 PM  LOS: 4 days

## 2012-12-22 LAB — GLUCOSE, CAPILLARY
Glucose-Capillary: 230 mg/dL — ABNORMAL HIGH (ref 70–99)
Glucose-Capillary: 233 mg/dL — ABNORMAL HIGH (ref 70–99)

## 2012-12-22 NOTE — Progress Notes (Signed)
Physical Therapy Treatment Patient Details Name: [name] MRN: [id number] DOB: 1929/03/08 Today's Date: 12/22/2012 Time: 7829-5621 PT Time Calculation (min): 24 min  PT Assessment / Plan / Recommendation  History of Present Illness Patient is an 79 y/o female admitted with right sided weakness head CT positive for interparenchymal hemorrhage in the left parietal lobe felt to be hypertensive in nature.   PT Comments   Pt progressing towards physical therapy goals. She was able to tolerate some standing exercise at the counter, and does well with demonstration. Frequent VC's required for posture and head position, as pt has significant forward head posture. Pt very lethargic at end of session and fell asleep during seated therapeutic exercise.  Follow Up Recommendations  SNF;Supervision/Assistance - 24 hour     Does the patient have the potential to tolerate intense rehabilitation     Barriers to Discharge        Equipment Recommendations  None recommended by PT    Recommendations for Other Services    Frequency Min 3X/week   Progress towards PT Goals Progress towards PT goals: Progressing toward goals  Plan Current plan remains appropriate    Precautions / Restrictions Precautions Precautions: Fall Restrictions Weight Bearing Restrictions: No   Pertinent Vitals/Pain Pt reports no pain throughout session.    Mobility  Bed Mobility Bed Mobility: Not assessed Details for Bed Mobility Assistance: Pt received up in recliner. Transfers Transfers: Sit to Stand;Stand to Sit Sit to Stand: 4: Min guard;From chair/3-in-1;With upper extremity assist Stand to Sit: 4: Min guard;To bed;With upper extremity assist;To chair/3-in-1;With armrests Details for Transfer Assistance: VC's for hand placement and safety awareness. Ambulation/Gait Ambulation/Gait Assistance: 4: Min assist Ambulation Distance (Feet): 200 Feet Assistive device: 1 person hand held assist Ambulation/Gait  Assistance Details: Frequent VC's for improved posture and head positioning, as she demonstrates significant forward head posture and thoracic kyphosis. Pt also cued for increased step/stride length. She occasionally required min A for support but overall was steady.  Gait Pattern: Step-through pattern;Decreased stride length;Narrow base of support Gait velocity: Decreased. General Gait Details: Trunk flexed occassionally during amb. Stairs: No    Exercises General Exercises - Lower Extremity Long Arc Quad: 10 reps;Both Straight Leg Raises: 10 reps;Both;Standing Heel Raises: 10 reps;Both Mini-Sqauts: 10 reps   PT Diagnosis:    PT Problem List:   PT Treatment Interventions:     PT Goals (current goals can now be found in the care plan section) Acute Rehab PT Goals Patient Stated Goal: none specified PT Goal Formulation: With patient Time For Goal Achievement: 01/02/13 Potential to Achieve Goals: Good  Visit Information  Last PT Received On: 12/22/12 Assistance Needed: +1 History of Present Illness: Patient is an 79 y/o female admitted with right sided weakness head CT positive for interparenchymal hemorrhage in the left parietal lobe felt to be hypertensive in nature.    Subjective Data  Subjective: "I'm not tired yet." Patient Stated Goal: none specified   Cognition  Cognition Arousal/Alertness: Awake/alert;Lethargic (at end of session) Behavior During Therapy: WFL for tasks assessed/performed Overall Cognitive Status: History of cognitive impairments - at baseline    Balance  Balance Balance Assessed: Yes Static Sitting Balance Static Sitting - Balance Support: Bilateral upper extremity supported;Feet supported Static Sitting - Level of Assistance: 5: Stand by assistance Static Sitting - Comment/# of Minutes: 5 Static Standing Balance Static Standing - Balance Support: Right upper extremity supported Static Standing - Level of Assistance: 5: Stand by assistance Static  Standing - Comment/# of  Minutes: 2  End of Session PT - End of Session Equipment Utilized During Treatment: Gait belt Activity Tolerance: Patient limited by fatigue Patient left: in chair;with call bell/phone within reach;with nursing/sitter in room Nurse Communication: Mobility status   GP     Ruthann Cancer 12/22/2012, 12:05 PM  Ruthann Cancer, PT, DPT Acute Rehabilitation Services

## 2012-12-22 NOTE — Progress Notes (Signed)
Inpatient Diabetes Program Recommendations  AACE/ADA: New Consensus Statement on Inpatient Glycemic Control (2013)  Target Ranges:  Prepandial:   less than 140 mg/dL      Peak postprandial:   less than 180 mg/dL (1-2 hours)      Critically ill patients:  140 - 180 mg/dL    Results for LEAUNA, SHARBER (MRN [id number]) as of 12/22/2012 13:50  Ref. Range 12/21/2012 08:06 12/21/2012 12:27 12/21/2012 17:17 12/21/2012 21:41  Glucose-Capillary Latest Range: 70-99 mg/dL 409 (H) 811 (H) 914 (H) 175 (H)    Results for KRISLYNN, GRONAU (MRN [id number]) as of 12/22/2012 13:50  Ref. Range 12/22/2012 07:43 12/22/2012 11:49  Glucose-Capillary Latest Range: 70-99 mg/dL 086 (H) 578 (H)    **Fasting glucose levels have been elevated X 2 days.  **MD, If this trend continues, please consider adding low dose basal insulin- Levemir 10 units QHS   Will follow. Ambrose Finland RN, MSN, CDE Diabetes Coordinator Inpatient Diabetes Program Team Pager: [phone] (8a-10p)

## 2012-12-22 NOTE — Clinical Social Work Psychosocial (Signed)
Clinical Social Work Department BRIEF PSYCHOSOCIAL ASSESSMENT 12/22/2012  Patient:  [name], [name]     Account Number:  [id number]     Admit date:  12/17/2012  Clinical Social Worker:  Lavell Luster  Date/Time:  12/22/2012 02:00 PM  Referred by:  Physician  Date Referred:  12/22/2012 Referred for  SNF Placement   Other Referral:   Interview type:  Family Other interview type:   CSW interviewed patient's mother over phone as patient is not oriented x4 at this time.    PSYCHOSOCIAL DATA Living Status:  WITH ADULT CHILDREN Admitted from facility:   Level of care:   Primary support name:  [name] (can be reached at number on facesheet) Primary support relationship to patient:  CHILD, ADULT Degree of support available:   Support is good.    CURRENT CONCERNS Current Concerns  Post-Acute Placement   Other Concerns:    SOCIAL WORK ASSESSMENT / PLAN CSW contacted daughter [name] to discuss patient's discharge and the recommendation for SNF. Daughter wants patient to go to [facility], [facility], or [facility] (memory care units). CSW explained that patient has been recommended for SNF placement and that these facilities may want patient to have SNF stay before being admitted to any of these ALFs. Daughter stated that patient has been evaluated by [facility] and [facility]. Patient is from home with daugter, but daughter is unable to managed patient on her own and states that patient is exhibiting wandering. Daughter would prefer SNF in [facility] if patient cannot be admitted to one of the memory units listed upon DC from the hospital.   Assessment/plan status:  Psychosocial Support/Ongoing Assessment of Needs Other assessment/ plan:   Complete FL2, PASRR, Fax out.   Information/referral to community resources:   SNF list left in patient's room for daughter's review.    PATIENT'S/FAMILY'S RESPONSE TO PLAN OF CARE: Daughter is hopefuly that patient will  DC to one of the memory units listed, but understands that patient may need to go to SNF first. Daughter is pleasant and appreciative of CSW contact. Daughter awaits decisions of memory units listed and SNF bed offers.     [name], [place], [place], [phone]

## 2012-12-22 NOTE — Progress Notes (Signed)
[facility] PROGRESS NOTE  [name] RUE:[id number] DOB: [birth year] DOA: 12/17/2012 PCP: Evlyn Courier, MD  Assessment/Plan:  1-Delirium, Hallucination: MRI positive for small stroke, discussed with neuro that wouldn't  explain patient presentation. Chest x ray negative for PNA. I have consulted psych to help with medications. Continue to hold imipramine and bromocriptine. Appreciate psych recommendation. Xanax change to klonopin, risperidone change to schedule 10-05, , and mirtazapine increase to 15 mg.  -UA no significant bacteria growth. discontinue ceftriaxone.   2-Diabetes: HB A 1 c at 7.3. Hold metformin while in the hospital.  SSI.  3-HTN: continue home meds.   4-HLD: continue statin.  5-Small Acute stroke in the cerebellar vermis: Continue with  aspirin.  no need for carotid doppler. ECHO no source of thrombus.     Code Status: Full Family Communication: none at bedside.  Disposition Plan: need SNF   Consultants:  Psych  Procedures: ECHO Left ventricle: The cavity size was normal. There was mild focal basal hypertrophy of the septum. Systolic function was normal. The estimated ejection fraction was in the range of 55% to 60%. Wall motion was normal; there were no regional wall motion abnormalities. Doppler parameters are consistent with abnormal left ventricular relaxation (grade 1 diastolic dysfunction). - Aortic valve: Valve mobility was restricted. There was very mild stenosis. Trivial regurgitation. - Mitral valve: Calcified annulus.    Antibiotics:  none  HPI/Subjective: Patient sleepy this morning.   Objective: Filed Vitals:   12/21/12 2144  BP: 127/65  Pulse: 97  Temp: 98.6 F (37 C)  Resp: 16    Intake/Output Summary (Last 24 hours) at 12/22/12 1323 Last data filed at 12/22/12 1037  Gross per 24 hour  Intake    480 ml  Output      0 ml  Net    480 ml   Filed Weights   12/17/12 1341 12/17/12 2013  Weight: 50.803 kg (112 lb)  48.308 kg (106 lb 8 oz)    Exam:   General: no distress.   Cardiovascular: S 1, S 2 RRR  Respiratory: CTA  Abdomen: BS present, nt  Musculoskeletal: no edema  Data Reviewed: Basic Metabolic Panel:  Recent Labs Lab 12/17/12 1351 12/17/12 2007  NA 136  --   K 4.2  --   CL 103  --   CO2 22  --   GLUCOSE 129*  --   BUN 22  --   CREATININE 0.93 0.80  CALCIUM 9.5  --    Liver Function Tests:  Recent Labs Lab 12/17/12 1351  AST 18  ALT 15  ALKPHOS 43  BILITOT 0.6  PROT 7.3  ALBUMIN 3.8   No results found for this basename: LIPASE, AMYLASE,  in the last 168 hours No results found for this basename: AMMONIA,  in the last 168 hours CBC:  Recent Labs Lab 12/17/12 1351 12/17/12 2007  WBC 6.1 5.7  HGB 11.0* 11.5*  HCT 33.2* 33.2*  MCV 82.0 81.2  PLT 203 218   Cardiac Enzymes: No results found for this basename: CKTOTAL, CKMB, CKMBINDEX, TROPONINI,  in the last 168 hours BNP (last 3 results) No results found for this basename: PROBNP,  in the last 8760 hours CBG:  Recent Labs Lab 12/21/12 1227 12/21/12 1717 12/21/12 2141 12/22/12 0743 12/22/12 1149  GLUCAP 162* 207* 175* 230* 283*    Recent Results (from the past 240 hour(s))  URINE CULTURE     Status: None   Collection Time    12/17/12  3:48 PM      Result Value Range Status   Specimen Description URINE, CLEAN CATCH   Final   Special Requests NONE   Final   Culture  Setup Time     Final   Value: 12/17/2012 16:43     Performed at [facility] Count     Final   Value: 4,000 COLONIES/ML     Performed at [facility]   Culture     Final   Value: INSIGNIFICANT GROWTH     Performed at [facility]   Report Status 12/18/2012 FINAL   Final  MRSA PCR SCREENING     Status: None   Collection Time    12/18/12  6:30 AM      Result Value Range Status   MRSA by PCR NEGATIVE  NEGATIVE Final   Comment:            The GeneXpert MRSA Assay (FDA     approved for NASAL  specimens     only), is one component of a     comprehensive MRSA colonization     surveillance program. It is not     intended to diagnose MRSA     infection nor to guide or     monitor treatment for     MRSA infections.     Studies: No results found.  Scheduled Meds: . acarbose  25 mg Oral TID WC  . atorvastatin  20 mg Oral QHS  . clonazePAM  0.25 mg Oral BID  . clopidogrel  75 mg Oral Q breakfast  . feeding supplement  237 mL Oral BID BM  . heparin  5,000 Units Subcutaneous Q8H  . insulin aspart  0-9 Units Subcutaneous TID WC  . losartan  50 mg Oral Daily  . mirtazapine  15 mg Oral QHS  . pantoprazole  40 mg Oral Daily  . polyethylene glycol  17 g Oral Daily  . risperiDONE  0.5 mg Oral QHS  . sodium chloride  3 mL Intravenous Q12H  . sucralfate  1 g Oral QID  . verapamil  120 mg Oral Daily   Continuous Infusions:   Active Problems:   DM   HYPERCHOLESTEROLEMIA   HYPERTENSION   GERD   Delirium   Hallucination   Malnutrition of moderate degree    Time spent: 25 minutes    Betsy Rosello  [facility] Pager [phone]. If 7PM-7AM, please contact night-coverage at [email or web address], password [facility] 12/22/2012, 1:23 PM  LOS: 5 days

## 2012-12-23 DIAGNOSIS — R404 Transient alteration of awareness: Secondary | ICD-10-CM | POA: Diagnosis not present

## 2012-12-23 DIAGNOSIS — R443 Hallucinations, unspecified: Secondary | ICD-10-CM | POA: Diagnosis not present

## 2012-12-23 LAB — GLUCOSE, CAPILLARY
Glucose-Capillary: 263 mg/dL — ABNORMAL HIGH (ref 70–99)
Glucose-Capillary: 178 mg/dL — ABNORMAL HIGH (ref 70–99)
Glucose-Capillary: 160 mg/dL — ABNORMAL HIGH (ref 70–99)

## 2012-12-23 MED ORDER — MIRTAZAPINE 15 MG PO TABS: 15.0000 mg | ORAL_TABLET | Freq: Every day | ORAL | Status: DC

## 2012-12-23 MED ORDER — CLOPIDOGREL BISULFATE 75 MG PO TABS: 75.0000 mg | ORAL_TABLET | Freq: Every day | ORAL | Status: AC

## 2012-12-23 MED ORDER — RISPERIDONE 0.5 MG PO TABS: 0.5000 mg | ORAL_TABLET | Freq: Every day | ORAL | Status: AC

## 2012-12-23 MED ORDER — TUBERCULIN PPD 5 UNIT/0.1ML ID SOLN
5.0000 [IU] | Freq: Once | INTRADERMAL | Status: DC
Start: 2012-12-23 — End: 2012-12-24
  Administered 2012-12-23: 5 [IU] via INTRADERMAL
  Filled 2012-12-23: qty 0.1

## 2012-12-23 MED ORDER — CLONAZEPAM 0.5 MG PO TABS: 0.2500 mg | ORAL_TABLET | Freq: Two times a day (BID) | ORAL | Status: DC

## 2012-12-23 MED ORDER — ATORVASTATIN CALCIUM 20 MG PO TABS: 20.0000 mg | ORAL_TABLET | Freq: Every day | ORAL | Status: DC

## 2012-12-23 NOTE — Progress Notes (Signed)
Spoke to pt's dtr on unit after receiving phone call from on-call CSW re: dtr not signing pt into [facility] today. Dtr denies speaking with CSW during pt's admission, admits to playing "phone tag" with CSW.  Dtr states that she doesn't want to be rushed to find a memory care unit and wants pt to go to a SNF until she can decide on the best placement for her mother.  CSW explained [place] bed search process/Medicare reimbursement for SNF etc.  Dtr. Agreeable to search in [employer].  CSW emphasized pt's medical readiness to leave hospital/d/c today and we would need to move her to SNF tomorrow.  Dtr. States that it will be tough for CSW to get a hold of her tomorrow b/c she has a Programmer, multimedia and won't be available until after 5:00 pm.  CSW stated that we could possibly arrange tx for the evening depending on which facility she chooses for pt.  Dtr. Had list of facilities already and will begin researching them this pm.  FL[facility] faxed and [place] filed.  Unit CSW notified and will f/u in am.

## 2012-12-23 NOTE — Progress Notes (Signed)
OT Cancellation Note  Patient Details Name: [name] MRN: [id number] DOB: 10/25/1928   Cancelled Treatment:    Reason Eval/Treat Not Completed: Other (comment) Pt discharge summary in and pt planning to go to SNF.  Earlie Raveling OTR/L [phone] 12/23/2012, 2:47 PM

## 2012-12-23 NOTE — Clinical Social Work Note (Addendum)
Due to shift change for CSW, Emi Holes will be contacting 6N RN to notify when RN can call for transportation. CSW completed discharge packet and EMS form. [address] Victorino Dike [phone]) contact information and [facility] [phone]) contact information was provided to RN.   Pt has been offered bed placement with [facility]. CSW and Emi Holes have attempted to contact pt's daughter for approval of discharge disposition. CSW currently awaiting to hear back from either [facility] or pt's daughter regarding approval. Emi Holes requested that pt have TB test completed with orders for TB test to be read prior to discharge. CSW informed RN and MD of information. CSW to continue to follow and assist with discharge planning needs.  Darlyn Chamber, MSW, LCSWA Clinical Social Work [phone]

## 2012-12-23 NOTE — Discharge Summary (Addendum)
Physician Discharge Summary  [name] YQM:[id number] DOB: Nov 10, 1928 DOA: 12/17/2012  PCP: Evlyn Courier, MD  Admit date: 12/17/2012 Discharge date: 12/23/2012  Time spent: 35 minutes  Recommendations for Outpatient Follow-up:  Follow up with psychiatrist for further adjustment of medication as needed.  Please order B12 level.   Discharge Diagnoses:    Delirium   Hallucination   DM   HYPERCHOLESTEROLEMIA   HYPERTENSION   GERD   Malnutrition of moderate degree   Discharge Condition: Stable  Diet recommendation: Heart Healthy  Filed Weights   12/17/12 1341 12/17/12 2013  Weight: 50.803 kg (112 lb) 48.308 kg (106 lb 8 oz)    History of present illness:  [name] is a 79 y.o. female with pmh of intercerebral hemorrhage in 09/2012, mild cognitive decline at baseline, hypertension, DM, who presents with 3-4 weeks of worsening memory and one week of hallucinations, disorientation, activation and wandering behavior. The patient is accompanied by her daughter who lives with her. She reports that for the past 5 days the patient has been awake almost constantly. She has been trying to leave the house in the middle of the night to pay bills, get her car fixed and do errands. She has been having visual hallucinations of worms, bugs, children. She has torn at the carpet in her room because of the hallucinations. She had been taking xanax at bedtime for sleep prior to last week, since symptoms started family giving xanax TID with no real change.  Patient was seen and examined on 12/24/12, appearing stable, without evidence of agitation. I discussed case with Irving Burton of SW, patient has been accepted to SNF. WiIl discharge this afternoon.   Hospital Course:  1-Delirium, Hallucination: MRI positive for small stroke, discussed with neuro that wouldn't explain patient presentation. Chest x ray negative for PNA. I have consulted psych to help with medications. Continue to hold imipramine and  bromocriptine. Appreciate psych recommendation. Xanax change to klonopin, risperidone change to schedule 10-05, , and mirtazapine increase to 15 mg.  -TSH 0.82 -UA no significant bacteria growth. discontinue ceftriaxone.  -Patient calm, no hallucination this morning. Resting comfortable.   2-Diabetes: HB A 1 c at 7.3. Hold metformin while in the hospital. SSI. Resume metformin at discharge.  3-HTN: continue home meds.  4-HLD: continue statin.  5-Small Acute stroke in the cerebellar vermis: Continue with aspirin. no need for carotid doppler. ECHO no source of thrombus.    Procedures: Procedures:  ECHO Left ventricle: The cavity size was normal. There was mild focal basal hypertrophy of the septum. Systolic function was normal. The estimated ejection fraction was in the range of 55% to 60%. Wall motion was normal; there were no regional wall motion abnormalities. Doppler parameters are consistent with abnormal left ventricular relaxation (grade 1 diastolic dysfunction). - Aortic valve: Valve mobility was restricted. There was very mild stenosis. Trivial regurgitation. - Mitral valve: Calcified annulus.   Consultations:  Neurology  Psychiatrist.   Discharge Exam: Filed Vitals:   12/23/12 0630  BP: 134/67  Pulse: 95  Temp: 98.2 F (36.8 C)  Resp: 16    General: no distress.  Cardiovascular: S 1, S 2 RRR Respiratory: CTA Abdomen: Soft nontender, nondistended Extremities: no edema  Discharge Instructions  Discharge Orders   Future Appointments Provider Department Dept Phone   12/24/2012 2:45 PM Micki Riley, MD [facility] [phone]   Future Orders Complete By Expires   Diet - low sodium heart healthy  As directed  Increase activity slowly  As directed        Medication List    STOP taking these medications       ALPRAZolam 0.5 MG tablet  Commonly known as:  XANAX     bromocriptine 2.5 MG tablet  Commonly known as:  PARLODEL      imipramine 10 MG tablet  Commonly known as:  TOFRANIL     simvastatin 40 MG tablet  Commonly known as:  ZOCOR      TAKE these medications       acarbose 25 MG tablet  Commonly known as:  PRECOSE  Take 1 tablet (25 mg total) by mouth 3 (three) times daily with meals.     acetaminophen 500 MG tablet  Commonly known as:  TYLENOL  Take 500 mg by mouth every 6 (six) hours as needed for pain.     atorvastatin 20 MG tablet  Commonly known as:  LIPITOR  Take 1 tablet (20 mg total) by mouth at bedtime.     clonazePAM 0.5 MG tablet  Commonly known as:  KLONOPIN  Take 0.5 tablets (0.25 mg total) by mouth 2 (two) times daily.     clopidogrel 75 MG tablet  Commonly known as:  PLAVIX  Take 1 tablet (75 mg total) by mouth daily with breakfast.     esomeprazole 40 MG capsule  Commonly known as:  NEXIUM  Take 1 capsule (40 mg total) by mouth 2 times daily at 12 noon and 4 pm.     linagliptin 5 MG Tabs tablet  Commonly known as:  TRADJENTA  Take 1 tablet (5 mg total) by mouth daily.     losartan 50 MG tablet  Commonly known as:  COZAAR  Take 1 tablet (50 mg total) by mouth daily.     metFORMIN 500 MG tablet  Commonly known as:  GLUCOPHAGE  Take 1 tablet (500 mg total) by mouth 2 (two) times daily with a meal.     mirtazapine 15 MG tablet  Commonly known as:  REMERON  Take 1 tablet (15 mg total) by mouth at bedtime.     polyethylene glycol powder powder  Commonly known as:  GLYCOLAX/MIRALAX  Take 17 g by mouth daily. For constipation. Hold if loose stools.     risperiDONE 0.5 MG tablet  Commonly known as:  RISPERDAL  Take 1 tablet (0.5 mg total) by mouth at bedtime.     sucralfate 1 GM/10ML suspension  Commonly known as:  CARAFATE  Take 10 mLs (1 g total) by mouth 4 (four) times daily.     verapamil 120 MG 24 hr capsule  Commonly known as:  VERELAN PM  Take 120 mg by mouth daily.     Vitamin D 2000 UNITS Caps  Take 2,000 Units by mouth daily.       No Known  Allergies     Follow-up Information   Follow up with HILL,GERALD K, MD In 1 week.   Specialty:  Family Medicine   Contact information:   [address] [place] [address] [phone]        The results of significant diagnostics from this hospitalization (including imaging, microbiology, ancillary and laboratory) are listed below for reference.    Significant Diagnostic Studies: Dg Chest 2 View  12/17/2012   *RADIOLOGY REPORT*  Clinical Data: Shortness of breath, weakness, evaluate for pneumonia, initial encounter.  CHEST - 2 VIEW  Comparison: 10/01/2012; 09/19/2012; 01/10/2006  Findings:  Grossly unchanged cardiac  silhouette and mediastinal contours with mild tortuosity of the thoracic aorta.  The lungs appear hyperexpanded with flattening of bilateral hemidiaphragms and mild diffuse slightly nodular thickening of the pulmonary interstitium. No focal airspace opacities.  No pleural effusion or pneumothorax. No evidence of edema.  Osteopenia with grossly unchanged mild compression deformity of a mid thoracic vertebral body.  IMPRESSION: Lung hyperexpansion without acute cardiopulmonary disease. Specifically, no definite evidence of pneumonia.   Original Report Authenticated By: Tacey Ruiz, MD   Ct Head Wo Contrast  12/17/2012   CLINICAL DATA:  Altered mental status, patient says she does not feel right, history of stroke, hypertension, diabetes, initial encounter  EXAM: CT HEAD WITHOUT CONTRAST  TECHNIQUE: Contiguous axial images were obtained from the base of the skull through the vertex without intravenous contrast.  COMPARISON:  10/01/2012  FINDINGS: Generalized atrophy.  Normal ventricular morphology.  No midline shift or mass effect.  Minimal small vessel chronic ischemic changes in deep cerebral white matter.  Small focus of encephalomalacia at a high left parietal gyrus at site of old hemorrhagic infarct.  No intracranial hemorrhage, mass lesion, or evidence of acute  infarction.  No extra-axial fluid collections.  Minimal atherosclerotic calcification is skullbase.  Bones and sinuses unremarkable.  IMPRESSION: Small focus of encephalomalacia at high left parietal lobe at site of old hemorrhagic infarct.  Atrophy with minimal small vessel chronic ischemic changes in deep cerebral white matter.  No acute intracranial abnormalities.   Electronically Signed   By: Ulyses Southward M.D.   On: 12/17/2012 17:14   Mr Laqueta Jean AV Contrast  12/19/2012   CLINICAL DATA:  To earlier a.m. Hallucinations. Worsening memory. Intra cerebral hemorrhage in July of 2014.  EXAM: MRI HEAD WITHOUT AND WITH CONTRAST  TECHNIQUE: Multiplanar, multiecho pulse sequences of the brain and surrounding structures were obtained according to standard protocol without and with intravenous contrast  CONTRAST:  10mL MULTIHANCE GADOBENATE DIMEGLUMINE 529 MG/ML IV SOLN  COMPARISON:  Head CT 12/17/2012. MRI 09/19/2012.  FINDINGS: Diffusion imaging shows a punctate acute infarction within the cerebellar vermis. No other acute infarction.  There chronic small vessel changes throughout the pons. The cerebral hemispheres show chronic small vessel changes throughout the deep white matter with an old left parietal cortical hemorrhage that has involuted. No evidence of additional stroke or bleeding in that region. No mass lesion, hydrocephalus or extra-axial collection. No pituitary mass. No inflammatory sinus disease. No skull or skullbase lesion.  IMPRESSION: Newly seen punctate acute infarction within the cerebellar vermis.  Chronic small vessel disease elsewhere throughout the brain.  Previously seen left parietal hemorrhage has involuted without evidence of additional stroke or hemorrhage in that region.   Electronically Signed   By: Paulina Fusi M.D.   On: 12/19/2012 07:30    Microbiology: Recent Results (from the past 240 hour(s))  URINE CULTURE     Status: None   Collection Time    12/17/12  3:48 PM      Result  Value Range Status   Specimen Description URINE, CLEAN CATCH   Final   Special Requests NONE   Final   Culture  Setup Time     Final   Value: 12/17/2012 16:43     Performed at [facility] Count     Final   Value: 4,000 COLONIES/ML     Performed at [facility]   Culture     Final   Value: INSIGNIFICANT GROWTH     Performed  at [facility]   Report Status 12/18/2012 FINAL   Final  MRSA PCR SCREENING     Status: None   Collection Time    12/18/12  6:30 AM      Result Value Range Status   MRSA by PCR NEGATIVE  NEGATIVE Final   Comment:            The GeneXpert MRSA Assay (FDA     approved for NASAL specimens     only), is one component of a     comprehensive MRSA colonization     surveillance program. It is not     intended to diagnose MRSA     infection nor to guide or     monitor treatment for     MRSA infections.     Labs: Basic Metabolic Panel:  Recent Labs Lab 12/17/12 1351 12/17/12 2007  NA 136  --   K 4.2  --   CL 103  --   CO2 22  --   GLUCOSE 129*  --   BUN 22  --   CREATININE 0.93 0.80  CALCIUM 9.5  --    Liver Function Tests:  Recent Labs Lab 12/17/12 1351  AST 18  ALT 15  ALKPHOS 43  BILITOT 0.6  PROT 7.3  ALBUMIN 3.8   No results found for this basename: LIPASE, AMYLASE,  in the last 168 hours No results found for this basename: AMMONIA,  in the last 168 hours CBC:  Recent Labs Lab 12/17/12 1351 12/17/12 2007  WBC 6.1 5.7  HGB 11.0* 11.5*  HCT 33.2* 33.2*  MCV 82.0 81.2  PLT 203 218   Cardiac Enzymes: No results found for this basename: CKTOTAL, CKMB, CKMBINDEX, TROPONINI,  in the last 168 hours BNP: BNP (last 3 results) No results found for this basename: PROBNP,  in the last 8760 hours CBG:  Recent Labs Lab 12/22/12 0743 12/22/12 1149 12/22/12 1655 12/22/12 2229 12/23/12 0754  GLUCAP 230* 283* 233* 168* 144*       Signed:  [name],BELKYS  [facility] 12/23/2012, 10:29  AM

## 2012-12-23 NOTE — Progress Notes (Signed)
PT Cancellation Note  Patient Details Name: ZAIYA ANNUNZIATO MRN: [id number] DOB: 06-01-1928   Cancelled Treatment:    Reason Eval/Treat Not Completed: Pain limiting ability to participate. Attempting to see patient x2. First attempt patient was complaining of stomach pain. Second attempt patient eating lunch. Will reattempt at another time.    Fredrich Birks 12/23/2012, 1:09 PM

## 2012-12-23 NOTE — Progress Notes (Signed)
Notified Dr. Sunnie Nielsen that pt's daughter reported she would not be signing papers today to transfer her mother. Talked with Victorino Dike at [facility] and daughter had reported to her that she wouldn't be able to transfer her mother for a few days. Contacted on call social worker, since after hours, to help with issues. Pt's daughter who is POA came to visit and had questions about cost of SNF versus memory care units and what options there are for her mother. Jodie, in social work, was able to come and talk with pt's daughter about options. Pt's daughter has now decided to go with a skilled nursing facility rather than a memory unit.

## 2012-12-24 ENCOUNTER — Ambulatory Visit: Payer: Self-pay | Admitting: Neurology

## 2012-12-24 DIAGNOSIS — F0391 Unspecified dementia with behavioral disturbance: Secondary | ICD-10-CM

## 2012-12-24 LAB — GLUCOSE, CAPILLARY
Glucose-Capillary: 178 mg/dL — ABNORMAL HIGH (ref 70–99)
Glucose-Capillary: 189 mg/dL — ABNORMAL HIGH (ref 70–99)
Glucose-Capillary: 313 mg/dL — ABNORMAL HIGH (ref 70–99)

## 2012-12-24 NOTE — Clinical Social Work Note (Addendum)
CSW spoke to pt's daughter via phone. Pt's daughter going to [employer] facilities and call on-call CSW by 5pm to give final decision in pt's discharge disposition. CSW has sent both [facility] facilities discharge information.   CSW received message from pt's daughter requesting SNF bed offers for pt. CSW contacted SNF bed offers regarding late admission for today including the daughter being able to complete paperwork. [facility] (both [facility] and [facility]) are able to accommodate this need for the pt. [facility] request that the discharge summary and AVS be sent to them by CSW before 5pm. CSW called pt's daughter and left message with information above. CSW instructed pt's daughter to contact after hours CSW (included CSW numbers in voicemail) if it was after shift change for 6N CSW. CSW to follow up with on-call CSW and MD regarding information above.  CSW attempted to contact pt's daughter at 2:02pm (12/24/2012) via phone, but was unable to reach. CSW left message with pt's daughter. CSW consulted with CSW Chiropodist regarding information. CSW to contact SNF that have offered bed placement for pt to see which facilities will accept after hours discharges. (CSW will update note once information has been collected). CSW to continue to attempt to contact pt's daughter. CSW will consult with after hours/on-call CSW prior to 6N CSW ending their shift.   CSW consulted with CSW Chiropodist regarding pt's discharge disposition and conversations with RN staff, on-call CSW, and attempts by 6N CSW by pt's daughter. CSW attempted to speak with pt's daughter via phone, CSW left message with pt's daughter at 8:50am (12/24/2012). CSW to continue to attempt to speak with pt's daughter regarding discharge disposition for today (12/24/2012).  Darlyn Chamber, MSW, LCSWA Clinical Social Work [phone]

## 2012-12-24 NOTE — Progress Notes (Signed)
PT Cancellation Note  Patient Details Name: LATESHIA SCHMOKER MRN: [id number] DOB: 04/22/1928   Cancelled Treatment:    Reason Eval/Treat Not Completed: Patient declined, no reason specified. Patient refused mulitple times to ambulate.    Fredrich Birks 12/24/2012, 1:46 PM

## 2012-12-24 NOTE — Progress Notes (Signed)
Report called to Alona Bene, Charity fundraiser at [facility]. Patient and belongings were transported to [facility] by [facility]. While giving report patient arrived at facility. Patient's daughter had already been to facility and met Alona Bene, Charity fundraiser, completed paperwork and left.

## 2012-12-24 NOTE — Progress Notes (Signed)
Pt's daughter agreeable to pt tx to [facility] this pm and planned to do paperwork there at 530 pm.  Facility agreeable to tx.  [facility] called for transport.

## 2012-12-29 ENCOUNTER — Other Ambulatory Visit: Payer: Self-pay | Admitting: *Deleted

## 2012-12-29 MED ORDER — CLONAZEPAM 0.25 MG PO TBDP: ORAL_TABLET | ORAL | Status: DC

## 2013-01-12 ENCOUNTER — Ambulatory Visit (INDEPENDENT_AMBULATORY_CARE_PROVIDER_SITE_OTHER): Payer: Medicare Other | Admitting: Endocrinology

## 2013-01-12 ENCOUNTER — Encounter: Payer: Self-pay | Admitting: Endocrinology

## 2013-01-12 VITALS — BP 138/74 | Temp 97.9°F | Wt 199.7 lb

## 2013-01-12 DIAGNOSIS — E119 Type 2 diabetes mellitus without complications: Secondary | ICD-10-CM

## 2013-01-12 MED ORDER — ATORVASTATIN CALCIUM 80 MG PO TABS: 80.0000 mg | ORAL_TABLET | Freq: Every day | ORAL | Status: DC

## 2013-01-12 MED ORDER — NATEGLINIDE 60 MG PO TABS: 60.0000 mg | ORAL_TABLET | Freq: Three times a day (TID) | ORAL | Status: DC

## 2013-01-12 NOTE — Progress Notes (Signed)
Subjective:    Patient ID: [name], female    DOB: 04/30/1928, 79 y.o.   MRN: [id number]  HPI Pt returns for f/u of type 2 DM (dx'ed 2000; she has mild if any neuropathy of the lower extremities. no known associated complications; she takes 4 oral agents).   She was recently hospitalized for delirium.  dtr says cbg's have been 180-200's in the hospital and since d/c. Past Medical History  Diagnosis Date  . Memory loss 06/13/2009  . Irritable bowel syndrome 06/13/2009  . MIGRAINE HEADACHE 06/13/2009  . OSTEOARTHRITIS 06/13/2009  . HYPERTENSION 06/13/2009  . GERD Sept 2012    [facility], pH and manometry.. hiatal hernia present, resting pressure of LES hypotensive. normal peristalsis esophageal contractions. DeMeester score 0.2. Excellent control of GERD on PPI.   Marland Kitchen HYPERCHOLESTEROLEMIA 06/13/2009  . DM 06/13/2009    Past Surgical History  Procedure Laterality Date  . Appendectomy      40+ years ago  . Esophagogastroduodenoscopy  03/30/2003     [id number] ring otherwise normal esophagus/  Small hiatal hernia/ Normal stomach/ Status post dilation of the ring in an unusual manner  . Colonoscopy  03/30/2003    Poor prep precluded completion of the examination.  . Colonoscopy  June 2006    Dr. Elpidio Anis: normal  . 24 hour ph study  Sept 2012    DeMeester 0.2  . Esophageal manometry      hypotensive LES    History   Social History  . Marital Status: Divorced    Spouse Name: N/A    Number of Children: N/A  . Years of Education: N/A   Occupational History  . [employer]     Retired   Social History Main Topics  . Smoking status: Never Smoker   . Smokeless tobacco: Not on file  . Alcohol Use: No  . Drug Use: No  . Sexual Activity: Not on file   Other Topics Concern  . Not on file   Social History Narrative  . No narrative on file    Current Outpatient Prescriptions on File Prior to Visit  Medication Sig Dispense Refill  . acarbose (PRECOSE) 25 MG  tablet Take 1 tablet (25 mg total) by mouth 3 (three) times daily with meals.  90 tablet  11  . acetaminophen (TYLENOL) 500 MG tablet Take 500 mg by mouth every 6 (six) hours as needed for pain.      . Cholecalciferol (VITAMIN D) 2000 UNITS CAPS Take 2,000 Units by mouth daily.       . clonazePAM (KLONOPIN) 0.25 MG disintegrating tablet Take one tablet by mouth twice daily  60 tablet  5  . clonazePAM (KLONOPIN) 0.5 MG tablet Take 0.5 tablets (0.25 mg total) by mouth 2 (two) times daily.  30 tablet  0  . clopidogrel (PLAVIX) 75 MG tablet Take 1 tablet (75 mg total) by mouth daily with breakfast.  30 tablet  0  . esomeprazole (NEXIUM) 40 MG capsule Take 1 capsule (40 mg total) by mouth 2 times daily at 12 noon and 4 pm.  60 capsule  5  . linagliptin (TRADJENTA) 5 MG TABS tablet Take 1 tablet (5 mg total) by mouth daily.  30 tablet  6  . losartan (COZAAR) 50 MG tablet Take 1 tablet (50 mg total) by mouth daily.  30 tablet  4  . metFORMIN (GLUCOPHAGE) 500 MG tablet Take 1 tablet (500 mg total) by mouth 2 (two) times daily with a meal.  60 tablet  11  . mirtazapine (REMERON) 15 MG tablet Take 1 tablet (15 mg total) by mouth at bedtime.  30 tablet  0  . polyethylene glycol powder (GLYCOLAX/MIRALAX) powder Take 17 g by mouth daily. For constipation. Hold if loose stools.  255 g  3  . risperiDONE (RISPERDAL) 0.5 MG tablet Take 1 tablet (0.5 mg total) by mouth at bedtime.  30 tablet  0  . sucralfate (CARAFATE) 1 GM/10ML suspension Take 10 mLs (1 g total) by mouth 4 (four) times daily.  420 mL  1  . verapamil (VERELAN PM) 120 MG 24 hr capsule Take 120 mg by mouth daily.        No current facility-administered medications on file prior to visit.    No Known Allergies  Family History  Problem Relation Age of Onset  . Diabetes Father   . Diabetes Sister   . Colon cancer Brother     deceased at age  28, diagnosed age late 56s.     BP 138/74  Temp(Src) 97.9 F (36.6 C) (Oral)  Wt 199 lb 11.2 oz  (90.583 kg)  BMI 33.23 kg/m2  Review of Systems She has gained weight.  She denies hypoglycemia    Objective:   Physical Exam VITAL SIGNS:  See vs page GENERAL: no distress  Lab Results  Component Value Date   HGBA1C 7.3* 12/17/2012      Assessment & Plan:  DM: she needs increased rx; we discussed the nine oral agents available for type 2 diabetes.  This regimen gives the best risk-benefit ratio.  She needs increased rx Deliruim: this and its rx may have worsened glycemic control.

## 2013-01-12 NOTE — Patient Instructions (Addendum)
Please make a follow-up appointment in 3 months.     check your blood sugar 1 time a day.  vary the time of day when you check, between before the 3 meals, and at bedtime.  also check if you have symptoms of your blood sugar being too high or too low.  please keep a record of the readings and bring it to your next appointment here.  please call us sooner if you are having low blood sugar episodes, or if it stays over 200.  Please add "nateglinide."  i have sent a prescription to your pharmacy

## 2013-01-14 ENCOUNTER — Telehealth: Payer: Self-pay | Admitting: Endocrinology

## 2013-01-14 NOTE — Telephone Encounter (Signed)
Please start nateglinide 60 mg tid (qac).

## 2013-01-14 NOTE — Telephone Encounter (Signed)
Faxed to [facility].

## 2013-01-22 ENCOUNTER — Telehealth: Payer: Self-pay | Admitting: Neurology

## 2013-01-22 ENCOUNTER — Encounter: Payer: Self-pay | Admitting: Neurology

## 2013-01-22 ENCOUNTER — Ambulatory Visit (INDEPENDENT_AMBULATORY_CARE_PROVIDER_SITE_OTHER): Payer: Medicare Other | Admitting: Neurology

## 2013-01-22 VITALS — BP 133/72 | HR 87 | Temp 98.7°F | Ht 66.5 in | Wt 129.0 lb

## 2013-01-22 DIAGNOSIS — F0391 Unspecified dementia with behavioral disturbance: Secondary | ICD-10-CM

## 2013-01-22 DIAGNOSIS — F03918 Unspecified dementia, unspecified severity, with other behavioral disturbance: Secondary | ICD-10-CM

## 2013-01-22 MED ORDER — MEMANTINE HCL ER 28 MG PO CP24: 28.0000 mg | ORAL_CAPSULE | Freq: Every day | ORAL | Status: AC

## 2013-01-22 NOTE — Patient Instructions (Signed)
Trial of Namenda for her dementia and she has not tolerated Aricept and Exelon in the past. I gave her a sample pack and a prescription for Namenda XR 28 mg as well as samples. Follow up with Dr. Levada Dy whom she has seen in the past in 2 months.

## 2013-01-22 NOTE — Progress Notes (Signed)
[facility] [address] [place]. [place] [address] [phone]       OFFICE FOLLOW-UP NOTE  [name] Date of Birth:  [birth year] Medical Record Number:  [id number]   HPI: 69 year African american lady who has had progressive cognitive decline and memory loss for more than a year. She is unable to provide history which is mostly obtained from her daughter as well as review of hospital chart She has seen Dr. Levada [name] in our office previously and was tried on Aricept which she took only for a few days and did not like. She was subsequently switched to Exelon patch which she states was too difficult for her to place and stopped using it. She has had further cognitive decline since her recent admission to the hospital for intercerebral hemorrhage in July 2014. She was admitted on 09/18/2012 and she woke up with right leg weakness and difficulty walking. CT scan of the head showed of focal left parietal 1.3 cm lobar hematoma with mild surrounding edema. MRI subsequently confirmed this. This was felt to be likely due to prior hypertensive hemorrhage. She did have vascular risk factors of hypertension, diabetes and hyperlipidemia. She had mild right leg weakness which improved. She went to a home and actually was they would walk and did all right but for the last 4 weeks or so she develop significant changes in her behavior with the disorientation, agitation, wandering and hallucinations and worsening of her memory. She was admitted again on 12/17/12 for one week to the hospital and found to have delirium. It was noted that she had difficulty paying her bills and getting her car fixed and inheritance. MRI scan showed a small white matter infarct which was felt to be incidental and could not explain her symptoms. Psychiatry was consulted to help with managing her medications. TSH was normal and UA showed no evidence of infection. She was transferred to a skilled nursing facility and is  doing a little better but the daughter is concerned about the patient feeling sleepy and during the day because she is getting 3 different medications including Remeron, Seroquel and Risperdal at night.  ROS:   14 system review of systems is positive for memory loss, confusion, hallucinations, agitation, joint pain, constipation, loss of vision, weight gain, his leg swelling, rash. PMH:  Past Medical History  Diagnosis Date  . Memory loss 06/13/2009  . Irritable bowel syndrome 06/13/2009  . MIGRAINE HEADACHE 06/13/2009  . OSTEOARTHRITIS 06/13/2009  . HYPERTENSION 06/13/2009  . GERD Sept 2012    [facility], pH and manometry.. hiatal hernia present, resting pressure of LES hypotensive. normal peristalsis esophageal contractions. DeMeester score 0.2. Excellent control of GERD on PPI.   Marland Kitchen HYPERCHOLESTEROLEMIA 06/13/2009  . DM 06/13/2009    Social History:  History   Social History  . Marital Status: Divorced    Spouse Name: N/A    Number of Children: 1  . Years of Education: N/A   Occupational History  . [employer]     Retired   Social History Main Topics  . Smoking status: Never Smoker   . Smokeless tobacco: Not on file  . Alcohol Use: No  . Drug Use: No  . Sexual Activity: No   Other Topics Concern  . Not on file   Social History Narrative  . No narrative on file    Medications:   Current Outpatient Prescriptions on File Prior to Visit  Medication Sig Dispense Refill  . acarbose (PRECOSE)  25 MG tablet Take 1 tablet (25 mg total) by mouth 3 (three) times daily with meals.  90 tablet  11  . acetaminophen (TYLENOL) 500 MG tablet Take 500 mg by mouth every 6 (six) hours as needed for pain.      . Cholecalciferol (VITAMIN D) 2000 UNITS CAPS Take 2,000 Units by mouth daily.       . clonazePAM (KLONOPIN) 0.25 MG disintegrating tablet Take one tablet by mouth twice daily  60 tablet  5  . clonazePAM (KLONOPIN) 0.5 MG tablet Take 0.5 tablets (0.25 mg total) by mouth 2 (two)  times daily.  30 tablet  0  . clopidogrel (PLAVIX) 75 MG tablet Take 1 tablet (75 mg total) by mouth daily with breakfast.  30 tablet  0  . linagliptin (TRADJENTA) 5 MG TABS tablet Take 1 tablet (5 mg total) by mouth daily.  30 tablet  6  . losartan (COZAAR) 50 MG tablet Take 1 tablet (50 mg total) by mouth daily.  30 tablet  4  . metFORMIN (GLUCOPHAGE) 500 MG tablet Take 1 tablet (500 mg total) by mouth 2 (two) times daily with a meal.  60 tablet  11  . mirtazapine (REMERON) 15 MG tablet Take 1 tablet (15 mg total) by mouth at bedtime.  30 tablet  0  . nateglinide (STARLIX) 60 MG tablet Take 1 tablet (60 mg total) by mouth 3 (three) times daily before meals.  90 tablet  11  . polyethylene glycol powder (GLYCOLAX/MIRALAX) powder Take 17 g by mouth daily. For constipation. Hold if loose stools.  255 g  3  . risperiDONE (RISPERDAL) 0.5 MG tablet Take 1 tablet (0.5 mg total) by mouth at bedtime.  30 tablet  0  . sucralfate (CARAFATE) 1 GM/10ML suspension Take 10 mLs (1 g total) by mouth 4 (four) times daily.  420 mL  1  . verapamil (VERELAN PM) 120 MG 24 hr capsule Take 120 mg by mouth daily.       Marland Kitchen esomeprazole (NEXIUM) 40 MG capsule Take 1 capsule (40 mg total) by mouth 2 times daily at 12 noon and 4 pm.  60 capsule  5   No current facility-administered medications on file prior to visit.    Allergies:  No Known Allergies  Physical Exam General: well developed, well nourished, seated, in no evident distress Head: head normocephalic and atraumatic. Orohparynx benign Neck: supple with no carotid or supraclavicular bruits Cardiovascular: regular rate and rhythm, no murmurs Musculoskeletal: Mild Skin:  no rash/petichiae. 1+ pedal edema Vascular:  Normal pulses all extremities Filed Vitals:   01/22/13 1504  BP: 133/72  Pulse: 87  Temp: 98.7 F (37.1 C)    Neurologic Exam Mental Status: Awake and fully alert. Oriented to place and time. Recent and remote memory poor. Attention span,  concentration and fund of knowledge diminished Mood and affect appropriate. Mini-Mental status exam score 16/30 with deficits in recall, orientation attention and repetition. Animal fluency test score 10. Clock drawing 3/4. Geriatric depression scale score 8. Katz index of Activities of Daily Living 6. Instrumental Activities of Daily Living Scale Score 3 Only. NPI-Q.  Delusions 1 for Hallucinations 2 for agitation, Depression, Upper T., Disinhibition, Irritability. 3 for Decreased Appetite 1 for Motor Disturbance and Induration. Cranial Nerves: Fundoscopic exam reveals sharp disc margins. Pupils equal, briskly reactive to light. Extraocular movements full without nystagmus. Visual fields full to confrontation. Hearing poor bilaterally.. Facial sensation intact. Face, tongue, palate moves normally and symmetrically.  Motor: Normal bulk  and tone. Normal strength in all tested extremity muscles. Sensory.: intact to touch and pinprick and vibratory sensation.  Coordination: Rapid alternating movements normal in all extremities. Finger-to-nose and heel-to-shin performed accurately bilaterally. Gait and Station: Arises from chair without difficulty. Stance is normal. Gait demonstrates normal stride length and balance . Able to heel, toe and tandem walk without difficulty.  Reflexes: 1+ and symmetric. Toes downgoing.       ASSESSMENT: 64 year lady with dementia to have significant cognitive decline following recent intercerebral hemorrhage in July 2014 and who has not tolerated Aricept and Exelon in the past.    PLAN: I had a long discussion with the patient and her daughter regarding her cognitive decline, discuss treatment plan and answered questions.Trial of Namenda for her dementia and she has not tolerated Aricept and Exelon in the past. I gave her a sample pack and a prescription for Namenda XR 28 mg as well as samples. I have advised the daughter to discuss with the skilled nursing facility medical  doctor to reduce her antipsychotics at night which may be responsible for her daytime sleepiness. Follow up with Dr. Levada [name] whom she has seen in the past in 2 months.

## 2013-01-23 ENCOUNTER — Telehealth: Payer: Self-pay | Admitting: Neurology

## 2013-01-27 NOTE — Telephone Encounter (Signed)
I called and LMVM for [name] to call me back.

## 2013-01-28 NOTE — Telephone Encounter (Signed)
I received her message.  Prescription to be sent today to [facility] [phone], [phone].  Spoke to [place] at the facility.

## 2013-01-29 NOTE — Telephone Encounter (Signed)
Phone note completed ° °

## 2013-02-02 ENCOUNTER — Telehealth: Payer: Self-pay | Admitting: *Deleted

## 2013-02-02 NOTE — Telephone Encounter (Signed)
It is good that you are seeing your pcp for this.  Please continue to f/u there for the swelling

## 2013-02-02 NOTE — Telephone Encounter (Signed)
Pt's daughter phoned in, stating patient's pedal edema was worsening, with present medication regime prescribed by dr. Everardo All.  Further states patient was seen by PCP this past weekend.  Is there anything that can be called in?  Please advise.  [phone] Winnifred Rosario Jacks

## 2013-02-02 NOTE — Telephone Encounter (Signed)
Called pt's daughter, Dot Been at [phone] and lvm advising her that Dr Everardo All said it is good that she is seeing her PCP for the edema. Advised her to please continue to f/up there for the swelling.

## 2013-04-13 ENCOUNTER — Ambulatory Visit: Payer: Self-pay | Admitting: Endocrinology

## 2013-04-22 ENCOUNTER — Encounter: Payer: Self-pay | Admitting: Endocrinology

## 2013-04-22 ENCOUNTER — Ambulatory Visit (INDEPENDENT_AMBULATORY_CARE_PROVIDER_SITE_OTHER): Payer: Medicare Other | Admitting: Endocrinology

## 2013-04-22 VITALS — BP 110/58 | HR 82 | Temp 97.7°F | Ht 66.5 in | Wt 140.0 lb

## 2013-04-22 DIAGNOSIS — E119 Type 2 diabetes mellitus without complications: Secondary | ICD-10-CM

## 2013-04-22 LAB — HEMOGLOBIN A1C: HEMOGLOBIN A1C: 8 % — AB (ref 4.6–6.5)

## 2013-04-22 MED ORDER — NATEGLINIDE 120 MG PO TABS: 120.0000 mg | ORAL_TABLET | Freq: Three times a day (TID) | ORAL | Status: DC

## 2013-04-22 NOTE — Patient Instructions (Addendum)
Please make a follow-up appointment in 3 months.     check your blood sugar 1 time a day.  vary the time of day when you check, between before the 3 meals, and at bedtime.  also check if you have symptoms of your blood sugar being too high or too low.  please keep a record of the readings and bring it to your next appointment here.  please call us sooner if you are having low blood sugar episodes, or if it stays over 200.  blood tests are being requested for you today.  We'll contact you with results. If it is high, we'll increase the "nateglinide."   Another option is we could resume "bromocriptine," if it is ok with your other medications.

## 2013-04-22 NOTE — Progress Notes (Signed)
Subjective:    Patient ID: [name], female    DOB: [birth year], 80 y.o.   MRN: [id number]  HPI Pt returns for f/u of type 2 DM (dx'ed 2000; she has mild if any neuropathy of the lower extremities; no known associated complications; she takes 4 oral agents; she declines invokana and welchol, due to cost; she cannot take actos, due to edema; she has never taken insulin, except in the hospital).  she brings a record of her cbg's which i have reviewed today.  It varies from 100-222.  It is in general higher as the day goes on.   Past Medical History  Diagnosis Date  . Memory loss 06/13/2009  . Irritable bowel syndrome 06/13/2009  . MIGRAINE HEADACHE 06/13/2009  . OSTEOARTHRITIS 06/13/2009  . HYPERTENSION 06/13/2009  . GERD Sept 2012    [facility], pH and manometry.. hiatal hernia present, resting pressure of LES hypotensive. normal peristalsis esophageal contractions. DeMeester score 0.2. Excellent control of GERD on PPI.   Marland Kitchen HYPERCHOLESTEROLEMIA 06/13/2009  . DM 06/13/2009    Past Surgical History  Procedure Laterality Date  . Appendectomy      40+ years ago  . Esophagogastroduodenoscopy  03/30/2003     [id number] ring otherwise normal esophagus/  Small hiatal hernia/ Normal stomach/ Status post dilation of the ring in an unusual manner  . Colonoscopy  03/30/2003    Poor prep precluded completion of the examination.  . Colonoscopy  June 2006    Dr. Elpidio Anis: normal  . 24 hour ph study  Sept 2012    DeMeester 0.2  . Esophageal manometry      hypotensive LES    History   Social History  . Marital Status: Divorced    Spouse Name: N/A    Number of Children: 1  . Years of Education: N/A   Occupational History  . [employer]     Retired   Social History Main Topics  . Smoking status: Never Smoker   . Smokeless tobacco: Not on file  . Alcohol Use: No  . Drug Use: No  . Sexual Activity: No   Other Topics Concern  . Not on file   Social History Narrative  .  No narrative on file    Current Outpatient Prescriptions on File Prior to Visit  Medication Sig Dispense Refill  . acarbose (PRECOSE) 25 MG tablet Take 1 tablet (25 mg total) by mouth 3 (three) times daily with meals.  90 tablet  11  . acetaminophen (TYLENOL) 500 MG tablet Take 500 mg by mouth every 6 (six) hours as needed for pain.      . Cholecalciferol (VITAMIN D) 2000 UNITS CAPS Take 2,000 Units by mouth daily.       . clonazePAM (KLONOPIN) 0.25 MG disintegrating tablet Take one tablet by mouth twice daily  60 tablet  5  . clonazePAM (KLONOPIN) 0.5 MG tablet Take 0.5 tablets (0.25 mg total) by mouth 2 (two) times daily.  30 tablet  0  . clopidogrel (PLAVIX) 75 MG tablet Take 1 tablet (75 mg total) by mouth daily with breakfast.  30 tablet  0  . esomeprazole (NEXIUM) 40 MG capsule Take 1 capsule (40 mg total) by mouth 2 times daily at 12 noon and 4 pm.  60 capsule  5  . linagliptin (TRADJENTA) 5 MG TABS tablet Take 1 tablet (5 mg total) by mouth daily.  30 tablet  6  . losartan (COZAAR) 50 MG tablet Take 1 tablet (  50 mg total) by mouth daily.  30 tablet  4  . Memantine HCl ER 28 MG CP24 Take 28 mg by mouth daily.  30 capsule  3  . metFORMIN (GLUCOPHAGE) 500 MG tablet Take 1 tablet (500 mg total) by mouth 2 (two) times daily with a meal.  60 tablet  11  . mirtazapine (REMERON) 15 MG tablet Take 1 tablet (15 mg total) by mouth at bedtime.  30 tablet  0  . polyethylene glycol powder (GLYCOLAX/MIRALAX) powder Take 17 g by mouth daily. For constipation. Hold if loose stools.  255 g  3  . QUEtiapine (SEROQUEL) 25 MG tablet       . risperiDONE (RISPERDAL) 0.5 MG tablet Take 1 tablet (0.5 mg total) by mouth at bedtime.  30 tablet  0  . sucralfate (CARAFATE) 1 GM/10ML suspension Take 10 mLs (1 g total) by mouth 4 (four) times daily.  420 mL  1  . verapamil (VERELAN PM) 120 MG 24 hr capsule Take 120 mg by mouth daily.        No current facility-administered medications on file prior to visit.     No Known Allergies  Family History  Problem Relation Age of Onset  . Diabetes Father   . Diabetes Sister   . Colon cancer Brother     deceased at age  78, diagnosed age late 260s.     BP 110/58  Pulse 82  Temp(Src) 97.7 F (36.5 C) (Oral)  Ht 5' 6.5" (1.689 m)  Wt 140 lb (63.504 kg)  BMI 22.26 kg/m2  SpO2 95%  Review of Systems She has abdominal bloating. denies hypoglycemia.    Objective:   Physical Exam VITAL SIGNS:  See vs page.   GENERAL: no distress.  Lab Results  Component Value Date   CREATININE 0.80 12/17/2012   BUN 22 12/17/2012   NA 136 12/17/2012   K 4.2 12/17/2012   CL 103 12/17/2012   CO2 22 12/17/2012      Assessment & Plan:  Type 2 DM: he needs increased rx Edema. This limits oral DM rx options. abd bloating: this precludes increasing the acarbose. Economic circumstances: these limit rx options of DM.

## 2013-04-23 ENCOUNTER — Other Ambulatory Visit: Payer: Self-pay

## 2013-04-23 MED ORDER — NATEGLINIDE 120 MG PO TABS: 120.0000 mg | ORAL_TABLET | Freq: Three times a day (TID) | ORAL | Status: DC

## 2013-05-21 ENCOUNTER — Encounter: Payer: Self-pay | Admitting: Diagnostic Neuroimaging

## 2013-05-21 ENCOUNTER — Ambulatory Visit (INDEPENDENT_AMBULATORY_CARE_PROVIDER_SITE_OTHER): Payer: Medicare Other | Admitting: Diagnostic Neuroimaging

## 2013-05-21 VITALS — BP 138/76 | HR 76 | Wt 147.0 lb

## 2013-05-21 DIAGNOSIS — F0390 Unspecified dementia without behavioral disturbance: Secondary | ICD-10-CM

## 2013-05-21 DIAGNOSIS — F03A0 Unspecified dementia, mild, without behavioral disturbance, psychotic disturbance, mood disturbance, and anxiety: Secondary | ICD-10-CM
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
## 2013-05-21 NOTE — Patient Instructions (Signed)
Continue namenda 

## 2013-07-21 ENCOUNTER — Encounter: Payer: Self-pay | Admitting: Endocrinology

## 2013-07-21 ENCOUNTER — Ambulatory Visit (INDEPENDENT_AMBULATORY_CARE_PROVIDER_SITE_OTHER): Payer: Medicare Other | Admitting: Endocrinology

## 2013-07-21 VITALS — BP 136/80 | HR 61 | Temp 97.8°F | Ht 66.5 in | Wt 157.0 lb

## 2013-07-21 DIAGNOSIS — E119 Type 2 diabetes mellitus without complications: Secondary | ICD-10-CM

## 2013-07-21 NOTE — Patient Instructions (Addendum)
Please make a follow-up appointment in 3 months.     check resident's blood sugar 4 times a day.  before the 3 meals, and at bedtime.  also check if you have symptoms of your blood sugar being too high or too low.  please keep a record of the readings and bring it to your next appointment here.  please call us sooner if you are having low blood sugar episodes, or if it stays over 200.  blood tests are being requested for you today.  We'll contact you with results. We'll ask [facility] for a record of the blood sugar.  Based on that, i'll prescribe insulin. We'll continue the diabetes pills for now.   Please eat just 3 meals per day, and avoid snacks.    Addendum: Please d/c levemir, and start novolog 5 units 3 times a day (just before each meal). Please come back for a follow-up appointment in 1 month.

## 2013-07-21 NOTE — Progress Notes (Signed)
Subjective:    Patient ID: [name], female    DOB: 07/07/1928, 80 y.o.   MRN: [id number]  HPI Pt returns for f/u of type 2 DM (dx'ed 2000; she has mild if any neuropathy of the lower extremities; no known associated complications; she takes 4 oral agents; she declines invokana and welchol, due to cost; she cannot take actos, due to edema; she has never taken insulin, except in the hospital; she lives at [facility]  no cbg record, but dtr states cbg's vary from 80-300. But most are in the 200's.  dtr further says cbg's are high in am, but she eats at hs.  She was rx'ed levemir 1 month ago, but dtr says pt's face got red and swollen.  Despite continuation of the levemir, the rash has resolved.   Past Medical History  Diagnosis Date  . Memory loss 06/13/2009  . Irritable bowel syndrome 06/13/2009  . MIGRAINE HEADACHE 06/13/2009  . OSTEOARTHRITIS 06/13/2009  . HYPERTENSION 06/13/2009  . GERD Sept 2012    [facility], pH and manometry.. hiatal hernia present, resting pressure of LES hypotensive. normal peristalsis esophageal contractions. DeMeester score 0.2. Excellent control of GERD on PPI.   Marland Kitchen. HYPERCHOLESTEROLEMIA 06/13/2009  . DM 06/13/2009    Past Surgical History  Procedure Laterality Date  . Appendectomy      40+ years ago  . Esophagogastroduodenoscopy  03/30/2003     WUJ:[id number]:Schatzki ring otherwise normal esophagus/  Small hiatal hernia/ Normal stomach/ Status post dilation of the ring in an unusual manner  . Colonoscopy  03/30/2003    Poor prep precluded completion of the examination.  . Colonoscopy  June 2006    Dr. Elpidio AnisLeroy Smith: normal  . 24 hour ph study  Sept 2012    DeMeester 0.2  . Esophageal manometry      hypotensive LES    History   Social History  . Marital Status: Divorced    Spouse Name: N/A    Number of Children: 1  . Years of Education: hs   Occupational History  . [employer]     Retired   Social History Main Topics  . Smoking status: Never  Smoker   . Smokeless tobacco: Never Used  . Alcohol Use: No  . Drug Use: No  . Sexual Activity: No   Other Topics Concern  . Not on file   Social History Narrative  . No narrative on file    Current Outpatient Prescriptions on File Prior to Visit  Medication Sig Dispense Refill  . acarbose (PRECOSE) 25 MG tablet Take 1 tablet (25 mg total) by mouth 3 (three) times daily with meals.  90 tablet  11  . acetaminophen (TYLENOL) 500 MG tablet Take 500 mg by mouth every 6 (six) hours as needed for pain.      . Cholecalciferol (VITAMIN D) 2000 UNITS CAPS Take 2,000 Units by mouth daily.       . clonazePAM (KLONOPIN) 0.25 MG disintegrating tablet Take one tablet by mouth twice daily  60 tablet  5  . clonazePAM (KLONOPIN) 0.5 MG tablet Take 0.5 tablets (0.25 mg total) by mouth 2 (two) times daily.  30 tablet  0  . clopidogrel (PLAVIX) 75 MG tablet Take 1 tablet (75 mg total) by mouth daily with breakfast.  30 tablet  0  . DEXILANT 60 MG capsule Take 60 capsules by mouth daily.      Marland Kitchen. esomeprazole (NEXIUM) 40 MG capsule Take 1 capsule (40 mg total)  by mouth 2 times daily at 12 noon and 4 pm.  60 capsule  5  . linagliptin (TRADJENTA) 5 MG TABS tablet Take 1 tablet (5 mg total) by mouth daily.  30 tablet  6  . losartan (COZAAR) 50 MG tablet Take 1 tablet (50 mg total) by mouth daily.  30 tablet  4  . Memantine HCl ER 28 MG CP24 Take 28 mg by mouth daily.  30 capsule  3  . metFORMIN (GLUCOPHAGE) 500 MG tablet Take 1 tablet (500 mg total) by mouth 2 (two) times daily with a meal.  60 tablet  11  . mirtazapine (REMERON) 15 MG tablet Take 1 tablet (15 mg total) by mouth at bedtime.  30 tablet  0  . nateglinide (STARLIX) 120 MG tablet Take 1 tablet (120 mg total) by mouth 3 (three) times daily with meals.  90 tablet  11  . polyethylene glycol powder (GLYCOLAX/MIRALAX) powder Take 17 g by mouth daily. For constipation. Hold if loose stools.  255 g  3  . pregabalin (LYRICA) 50 MG capsule Take 50 mg by  mouth at bedtime.      Marland Kitchen. QUEtiapine (SEROQUEL) 25 MG tablet       . risperiDONE (RISPERDAL) 0.5 MG tablet Take 1 tablet (0.5 mg total) by mouth at bedtime.  30 tablet  0  . simvastatin (ZOCOR) 20 MG tablet Take 20 mg by mouth daily.      . sucralfate (CARAFATE) 1 GM/10ML suspension Take 10 mLs (1 g total) by mouth 4 (four) times daily.  420 mL  1  . verapamil (VERELAN PM) 120 MG 24 hr capsule Take 120 mg by mouth daily.        No current facility-administered medications on file prior to visit.    No Known Allergies  Family History  Problem Relation Age of Onset  . Diabetes Father   . Diabetes Sister   . Colon cancer Brother     deceased at age  78, diagnosed age late 8160s.     BP 136/80  Pulse 61  Temp(Src) 97.8 F (36.6 C) (Oral)  Ht 5' 6.5" (1.689 m)  Wt 157 lb (71.215 kg)  BMI 24.96 kg/m2  SpO2 96%   Review of Systems Pt has gained weight.  Pt and dtr are unaware of any hypoglycemia.      Objective:   Physical Exam VITAL SIGNS:  See vs page GENERAL: no distress   outside test results are reviewed: cbg record is obtained from [facility], and reviewed: It varies from 98-200, but most are in the 100's.  There is no trend throughout the day. Lab Results  Component Value Date   HGBA1C 8.5* 07/21/2013      Assessment & Plan:  Weight gain, new DM: control is worse, prob due to the above. Memory loss: this complicates the rx of DM.

## 2013-07-22 LAB — MICROALBUMIN / CREATININE URINE RATIO
Creatinine,U: 19.5 mg/dL
Microalb Creat Ratio: 1 mg/g (ref 0.0–30.0)
Microalb, Ur: 0.2 mg/dL (ref 0.0–1.9)

## 2013-07-22 LAB — HEMOGLOBIN A1C: Hgb A1c MFr Bld: 8.5 % — ABNORMAL HIGH (ref 4.6–6.5)

## 2013-07-27 ENCOUNTER — Telehealth: Payer: Self-pay

## 2013-07-27 NOTE — Telephone Encounter (Signed)
Please increase the novolog to 8 units 3 times a day (just before each meal) i'll see you next time.

## 2013-07-27 NOTE — Telephone Encounter (Signed)
Pt's daughter called stating her mother blood sugars have been over 200 since last visit. Pt is taking 5 units of novolog 3 times a day just before each meal.  Please advise, Thanks!

## 2013-07-27 NOTE — Telephone Encounter (Signed)
Left voicemail for pt's daughter informing of new dosages.

## 2013-07-29 ENCOUNTER — Telehealth: Payer: Self-pay

## 2013-07-29 NOTE — Telephone Encounter (Signed)
[facility] pt's facility notified of new dosage.

## 2013-08-04 ENCOUNTER — Telehealth: Payer: Self-pay | Admitting: Endocrinology

## 2013-08-04 NOTE — Telephone Encounter (Signed)
Ok, please reduce novolog to 7 units 3 times a day (just before each meal) Add levemir, 3 units qhs

## 2013-08-04 NOTE — Telephone Encounter (Signed)
Please call [facility]: Does pt eat a snack at hs?

## 2013-08-04 NOTE — Telephone Encounter (Signed)
Called and spoke with [facility] Nurse Neysa Bonitohristy. She states the pt has a snack at 10:30 and 3:30.   Please advise, Thanks!

## 2013-08-05 NOTE — Telephone Encounter (Signed)
New directions faxed to pt's facility [facility].

## 2013-08-25 ENCOUNTER — Telehealth: Payer: Self-pay | Admitting: *Deleted

## 2013-08-25 NOTE — Telephone Encounter (Signed)
Had a cortisone injection yesterday her sugars reading over 300 increase in insulin unit temporarily, [name] per daughter needs this taking care of today

## 2013-08-25 NOTE — Telephone Encounter (Signed)
See below and please advise, Thanks!  

## 2013-08-25 NOTE — Telephone Encounter (Signed)
Check cbg qac and qhs.  Take novolog 5 units for any cbg in the 200's, and 10 units for any over 300's.

## 2013-08-26 NOTE — Telephone Encounter (Signed)
Please call erin [phone] at [facility] needs clarification of order just sent please

## 2013-08-27 NOTE — Telephone Encounter (Signed)
New orders faxed to [facility].

## 2013-11-18 ENCOUNTER — Ambulatory Visit (INDEPENDENT_AMBULATORY_CARE_PROVIDER_SITE_OTHER): Payer: Medicare Other | Admitting: Internal Medicine

## 2013-11-18 ENCOUNTER — Encounter: Payer: Self-pay | Admitting: Internal Medicine

## 2013-11-18 VITALS — BP 138/70 | HR 96 | Temp 97.4°F | Resp 20 | Ht 66.5 in | Wt 167.0 lb

## 2013-11-18 DIAGNOSIS — F329 Major depressive disorder, single episode, unspecified: Secondary | ICD-10-CM

## 2013-11-18 DIAGNOSIS — R0600 Dyspnea, unspecified: Secondary | ICD-10-CM | POA: Insufficient documentation

## 2013-11-18 DIAGNOSIS — I10 Essential (primary) hypertension: Secondary | ICD-10-CM

## 2013-11-18 DIAGNOSIS — F0393 Unspecified dementia, unspecified severity, with mood disturbance: Secondary | ICD-10-CM | POA: Insufficient documentation

## 2013-11-18 DIAGNOSIS — K2100 Gastro-esophageal reflux disease with esophagitis, without bleeding: Secondary | ICD-10-CM

## 2013-11-18 DIAGNOSIS — F3289 Other specified depressive episodes: Secondary | ICD-10-CM

## 2013-11-18 DIAGNOSIS — K5900 Constipation, unspecified: Secondary | ICD-10-CM

## 2013-11-18 DIAGNOSIS — F0280 Dementia in other diseases classified elsewhere without behavioral disturbance: Secondary | ICD-10-CM

## 2013-11-18 DIAGNOSIS — R0989 Other specified symptoms and signs involving the circulatory and respiratory systems: Secondary | ICD-10-CM

## 2013-11-18 DIAGNOSIS — E7800 Pure hypercholesterolemia, unspecified: Secondary | ICD-10-CM

## 2013-11-18 DIAGNOSIS — E1149 Type 2 diabetes mellitus with other diabetic neurological complication: Secondary | ICD-10-CM

## 2013-11-18 DIAGNOSIS — E1142 Type 2 diabetes mellitus with diabetic polyneuropathy: Secondary | ICD-10-CM | POA: Insufficient documentation

## 2013-11-18 DIAGNOSIS — M1990 Unspecified osteoarthritis, unspecified site: Secondary | ICD-10-CM

## 2013-11-18 DIAGNOSIS — R0609 Other forms of dyspnea: Secondary | ICD-10-CM

## 2013-11-18 DIAGNOSIS — F0150 Vascular dementia without behavioral disturbance: Secondary | ICD-10-CM

## 2013-11-18 MED ORDER — PREGABALIN 50 MG PO CAPS: 50.0000 mg | ORAL_CAPSULE | Freq: Every day | ORAL | Status: DC

## 2013-11-18 MED ORDER — MIRTAZAPINE 15 MG PO TABS: ORAL_TABLET | ORAL | Status: DC

## 2013-11-18 MED ORDER — CLONAZEPAM 0.5 MG PO TABS: 0.2500 mg | ORAL_TABLET | Freq: Two times a day (BID) | ORAL | Status: DC

## 2013-11-18 NOTE — Patient Instructions (Signed)
Bring copy of POA next visit.   Taper remeron as advised

## 2013-11-18 NOTE — Progress Notes (Signed)
Patient ID: [name], female   DOB: 06/18/1928, 80 y.o.   MRN: [id number]    Chief Complaint  Patient presents with  . Establish Care   No Known Allergies  HPI 80 y/o female pt is here to establish care. She was getting medical care with Dr Mirna Mires with [facility] until few months back. She has hx of HTN, migraine, cva, constipation, gerd, dm, dementia among others. POA Winifert Turri-Graves her daughter is here with her this visit Did not bring POA paperwork. No living willto bring it next visit She has been gaining weight and gets short of breath easily these days  Wt Readings from Last 3 Encounters:  11/18/13 167 lb (75.751 kg)  07/21/13 157 lb (71.215 kg)  05/21/13 147 lb (66.679 kg)   Review of Systems  Constitutional: Negative for fever, chills, malaise/fatigue and diaphoresis.  HENT: Negative for congestion, hearing loss and sore throat.   Eyes: Negative for blurred vision, double vision and discharge.  Respiratory: Negative for cough, sputum production, wheezing.  SOB with exertion Cardiovascular: Negative for chest pain, palpitations. Has leg swelling. uses 2 pillows to sleep Gastrointestinal: Negative for heartburn, nausea, vomiting, abdominal pain, diarrhea. Has constipation and is on laxatives. Has hx of hiatal hernia and reflux. Medications are keeping symptoms under control  Genitourinary: Negative for dysuria, urgency, frequency and flank pain.  Musculoskeletal: Negative for back pain, falls.  Does not use assistive device. Uses ted hose. Has hx of bursitis and arthritis Skin: Negative for itching and rash.  Neurological: Negative for dizziness, tingling, focal weakness. has diabetic neuropathy of her feet. Occasional headache. Has history of migraine Psychiatric/Behavioral: positive for depression and memory loss. Denies insomnia  Past Medical History  Diagnosis Date  . Memory loss 06/13/2009  . Irritable bowel syndrome 06/13/2009  . MIGRAINE HEADACHE  06/13/2009  . OSTEOARTHRITIS 06/13/2009  . HYPERTENSION 06/13/2009  . GERD Sept 2012    [facility], pH and manometry.. hiatal hernia present, resting pressure of LES hypotensive. normal peristalsis esophageal contractions. DeMeester score 0.2. Excellent control of GERD on PPI.   Marland Kitchen HYPERCHOLESTEROLEMIA 06/13/2009  . DM 06/13/2009   Past Surgical History  Procedure Laterality Date  . Appendectomy      40+ years ago  . Esophagogastroduodenoscopy  03/30/2003     WUJ:[id number] ring otherwise normal esophagus/  Small hiatal hernia/ Normal stomach/ Status post dilation of the ring in an unusual manner  . Colonoscopy  03/30/2003    Poor prep precluded completion of the examination.  . Colonoscopy  June 2006    Dr. Elpidio Anis: normal  . 24 hour ph study  Sept 2012    DeMeester 0.2  . Esophageal manometry      hypotensive LES   Current Outpatient Prescriptions on File Prior to Visit  Medication Sig Dispense Refill  . acarbose (PRECOSE) 25 MG tablet Take 1 tablet (25 mg total) by mouth 3 (three) times daily with meals.  90 tablet  11  . acetaminophen (TYLENOL) 500 MG tablet Take 500 mg by mouth every 6 (six) hours as needed for pain.      . Cholecalciferol (VITAMIN D) 2000 UNITS CAPS Take 2,000 Units by mouth daily.       . clopidogrel (PLAVIX) 75 MG tablet Take 1 tablet (75 mg total) by mouth daily with breakfast.  30 tablet  0  . esomeprazole (NEXIUM) 40 MG capsule Take 1 capsule (40 mg total) by mouth 2 times daily at 12 noon and 4  pm.  60 capsule  5  . insulin aspart (NOVOLOG) 100 UNIT/ML injection Inject 7 Units into the skin 3 (three) times daily with meals. Morning and evening inject 5 units as needed for BS > 200 and 10 units for BS > 300.      Marland Kitchen linagliptin (TRADJENTA) 5 MG TABS tablet Take 1 tablet (5 mg total) by mouth daily.  30 tablet  6  . losartan (COZAAR) 50 MG tablet Take 1 tablet (50 mg total) by mouth daily.  30 tablet  4  . Memantine HCl ER 28 MG CP24 Take 28 mg by  mouth daily.  30 capsule  3  . metFORMIN (GLUCOPHAGE) 500 MG tablet Take 1 tablet (500 mg total) by mouth 2 (two) times daily with a meal.  60 tablet  11  . nateglinide (STARLIX) 120 MG tablet Take 1 tablet (120 mg total) by mouth 3 (three) times daily with meals.  90 tablet  11  . polyethylene glycol powder (GLYCOLAX/MIRALAX) powder Take 17 g by mouth daily. For constipation. Hold if loose stools.  255 g  3  . QUEtiapine (SEROQUEL) 25 MG tablet Take 25 mg by mouth at bedtime.       . risperiDONE (RISPERDAL) 0.5 MG tablet Take 1 tablet (0.5 mg total) by mouth at bedtime.  30 tablet  0  . simvastatin (ZOCOR) 20 MG tablet Take 20 mg by mouth daily.      . sucralfate (CARAFATE) 1 GM/10ML suspension Take 10 mLs (1 g total) by mouth 4 (four) times daily.  420 mL  1  . verapamil (VERELAN PM) 120 MG 24 hr capsule Take 120 mg by mouth daily.       . clonazePAM (KLONOPIN) 0.25 MG disintegrating tablet Take one tablet by mouth twice daily  60 tablet  5  . DEXILANT 60 MG capsule Take 60 capsules by mouth daily.       No current facility-administered medications on file prior to visit.   Family History  Problem Relation Age of Onset  . Diabetes Father   . Diabetes Sister   . Colon cancer Brother     deceased at age  48, diagnosed age late 60s.    History   Social History  . Marital Status: Divorced    Spouse Name: N/A    Number of Children: 1  . Years of Education: hs   Occupational History  . [employer]     Retired   Social History Main Topics  . Smoking status: Never Smoker   . Smokeless tobacco: Never Used  . Alcohol Use: No  . Drug Use: No  . Sexual Activity: No   Other Topics Concern  . Not on file   Social History Narrative  . No narrative on file   Physical exam BP 138/70  Pulse 96  Temp(Src) 97.4 F (36.3 C) (Oral)  Resp 20  Ht 5' 6.5" (1.689 m)  Wt 167 lb (75.751 kg)  BMI 26.55 kg/m2  SpO2 95%  General- elderly female in no acute distress Head- atraumatic,  normocephalic Eyes- PERRLA, EOMI, no pallor, no icterus, no discharge Neck- no lymphadenopathy, no thyromegaly Mouth- normal mucus membrane Cardiovascular- normal s1,s2, no murmurs Respiratory- bilateral clear to auscultation, no wheeze, no rhonchi, no crackles Abdomen- bowel sounds present, soft, non tender Musculoskeletal- able to move all 4 extremities, 1+ edema Neurological- no focal deficit Skin- warm and dry Psychiatry- alert and oriented to person, place and time, normal mood and affect  Lab Results  Component Value Date   HGBA1C 8.5* 07/21/2013   CMP     Component Value Date/Time   NA 136 12/17/2012 1351   K 4.2 12/17/2012 1351   CL 103 12/17/2012 1351   CO2 22 12/17/2012 1351   GLUCOSE 129* 12/17/2012 1351   BUN 22 12/17/2012 1351   CREATININE 0.80 12/17/2012 2007   CREATININE 1.07 06/06/2012 0931   CALCIUM 9.5 12/17/2012 1351   PROT 7.3 12/17/2012 1351   ALBUMIN 3.8 12/17/2012 1351   AST 18 12/17/2012 1351   AST 17 12/12/2011 1400   ALT 15 12/17/2012 1351   ALKPHOS 43 12/17/2012 1351   ALKPHOS 37 12/12/2011 1400   BILITOT 0.6 12/17/2012 1351   BILITOT 0.5 12/12/2011 1400   GFRNONAA 67* 12/17/2012 2007   GFRAA 78* 12/17/2012 2007   CBC Latest Ref Rng 12/17/2012 12/17/2012 10/01/2012  WBC 4.0 - 10.5 K/uL 5.7 6.1 7.2  Hemoglobin 12.0 - 15.0 g/dL 11.5(L) 11.0(L) 11.7(L)  Hematocrit 36.0 - 46.0 % 33.2(L) 33.2(L) 34.3(L)  Platelets 150 - 400 K/uL 218 203 198    Lipid Panel     Component Value Date/Time   CHOL 168 12/21/2012 0549   TRIG 70 12/21/2012 0549   HDL 55 12/21/2012 0549   CHOLHDL 3.1 12/21/2012 0549   VLDL 14 12/21/2012 0549   LDLCALC 99 12/21/2012 0549   cbg this am 156  Echocardiogram 12/19/12 Study Conclusions  - Left ventricle: The cavity size was normal. There was mild   focal basal hypertrophy of the septum. Systolic function   was normal. The estimated ejection fraction was in the   range of 55% to 60%. Wall motion was normal; there were no   regional wall motion  abnormalities. Doppler parameters are   consistent with abnormal left ventricular relaxation   (grade 1 diastolic dysfunction). - Aortic valve: Valve mobility was restricted. There was   very mild stenosis. Trivial regurgitation. - Mitral valve: Calcified annulus.  Assessment/plan  1. HYPERTENSION Continue losartan 50 mg daily and verapamil for now, continue plavix - CBC with Differential; Future - TSH; Future - CMP; Future  2. Constipation, unspecified constipation type Stable on miralax at present  3. Gastroesophageal reflux disease with esophagitis controlled symptoms. Hx of schitzaki's ring. On nexium with dexilant and carafate, monitor  4. OSTEOARTHRITIS On prn tylenol, monitor  5. HYPERCHOLESTEROLEMIA Continue zocor and monitor lipid panel - Lipid Panel; Future - TSH; Future  6. 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 Depression due to dementia Continue risperdal and seroquel for now. Also on remeron- taper off remeron 7.5 mg daily for a week and then stop  8. DM type 2 with diabetic peripheral neuropathy Follows with dr Everardo All- on acarbose, novolog, levemir, metformin and starlix, tradjenta . monitor cbgcontinue kyrica for neuropathic pain - TSH; Future - Hemoglobin A1c; Future - CMP; Future  9. Dyspnea New onset with slow worsening. Normal lung exam. Will get echocardiogram to assess for valvular abnormality and diastolic function worsening. On losartan. Weight gain has also contributed some - 2D Echocardiogram without contrast; Future

## 2013-12-02 ENCOUNTER — Other Ambulatory Visit (HOSPITAL_COMMUNITY): Payer: Self-pay | Admitting: Family Medicine

## 2013-12-02 ENCOUNTER — Other Ambulatory Visit (HOSPITAL_COMMUNITY): Payer: Self-pay | Admitting: Internal Medicine

## 2013-12-02 ENCOUNTER — Telehealth: Payer: Self-pay | Admitting: *Deleted

## 2013-12-02 DIAGNOSIS — R0602 Shortness of breath: Secondary | ICD-10-CM

## 2013-12-02 NOTE — Telephone Encounter (Signed)
Received Fax order regarding patient from [facility]--Resident has been very weepy/sad the last several days. She is also having increased confusion. Resident is spending most of the day in her room when she is normally very active. Please Advise. Per Dr. Darcus Austin make appointment to assess for depression and start her on antidepressant if necessary. I Spoke with Jasmine at [facility] and she will call back to schedule patient an appointment. Faxed order to her at [facility] Fax# [phone]

## 2013-12-03 ENCOUNTER — Ambulatory Visit (HOSPITAL_COMMUNITY): Payer: Medicare Other | Attending: Cardiology | Admitting: Radiology

## 2013-12-03 DIAGNOSIS — R0989 Other specified symptoms and signs involving the circulatory and respiratory systems: Secondary | ICD-10-CM | POA: Diagnosis not present

## 2013-12-03 DIAGNOSIS — R0609 Other forms of dyspnea: Secondary | ICD-10-CM | POA: Insufficient documentation

## 2013-12-03 DIAGNOSIS — R609 Edema, unspecified: Secondary | ICD-10-CM | POA: Insufficient documentation

## 2013-12-03 DIAGNOSIS — R0602 Shortness of breath: Secondary | ICD-10-CM | POA: Diagnosis present

## 2013-12-03 DIAGNOSIS — I359 Nonrheumatic aortic valve disorder, unspecified: Secondary | ICD-10-CM | POA: Diagnosis present

## 2013-12-03 DIAGNOSIS — E119 Type 2 diabetes mellitus without complications: Secondary | ICD-10-CM | POA: Diagnosis not present

## 2013-12-03 DIAGNOSIS — I10 Essential (primary) hypertension: Secondary | ICD-10-CM | POA: Diagnosis not present

## 2013-12-03 NOTE — Progress Notes (Signed)
Echocardiogram performed.  

## 2013-12-14 ENCOUNTER — Other Ambulatory Visit: Payer: Self-pay | Admitting: *Deleted

## 2013-12-14 MED ORDER — POLYETHYLENE GLYCOL 3350 17 GM/SCOOP PO POWD: ORAL | Status: DC

## 2013-12-14 NOTE — Telephone Encounter (Signed)
[facility] Fax order

## 2013-12-15 ENCOUNTER — Ambulatory Visit (INDEPENDENT_AMBULATORY_CARE_PROVIDER_SITE_OTHER): Payer: Medicare Other | Admitting: Internal Medicine

## 2013-12-15 ENCOUNTER — Ambulatory Visit: Payer: Medicare Other | Admitting: Internal Medicine

## 2013-12-15 ENCOUNTER — Encounter: Payer: Self-pay | Admitting: Internal Medicine

## 2013-12-15 ENCOUNTER — Telehealth: Payer: Self-pay

## 2013-12-15 VITALS — BP 142/80 | HR 96 | Temp 97.4°F | Resp 10 | Wt 166.0 lb

## 2013-12-15 DIAGNOSIS — E118 Type 2 diabetes mellitus with unspecified complications: Secondary | ICD-10-CM

## 2013-12-15 DIAGNOSIS — F3289 Other specified depressive episodes: Secondary | ICD-10-CM

## 2013-12-15 DIAGNOSIS — F329 Major depressive disorder, single episode, unspecified: Secondary | ICD-10-CM

## 2013-12-15 DIAGNOSIS — K219 Gastro-esophageal reflux disease without esophagitis: Secondary | ICD-10-CM

## 2013-12-15 DIAGNOSIS — F32A Depression, unspecified: Secondary | ICD-10-CM

## 2013-12-15 DIAGNOSIS — R143 Flatulence: Secondary | ICD-10-CM

## 2013-12-15 DIAGNOSIS — Z23 Encounter for immunization: Secondary | ICD-10-CM

## 2013-12-15 DIAGNOSIS — R141 Gas pain: Secondary | ICD-10-CM

## 2013-12-15 DIAGNOSIS — R142 Eructation: Secondary | ICD-10-CM

## 2013-12-15 MED ORDER — INSULIN DETEMIR 100 UNIT/ML ~~LOC~~ SOLN: 5.0000 [IU] | Freq: Every day | SUBCUTANEOUS | Status: DC

## 2013-12-15 MED ORDER — INSULIN ASPART 100 UNIT/ML ~~LOC~~ SOLN: 7.0000 [IU] | Freq: Three times a day (TID) | SUBCUTANEOUS | Status: DC

## 2013-12-15 MED ORDER — CITALOPRAM HYDROBROMIDE 20 MG PO TABS: 20.0000 mg | ORAL_TABLET | Freq: Every day | ORAL | Status: DC

## 2013-12-15 MED ORDER — SIMETHICONE 125 MG PO CHEW: 125.0000 mg | CHEWABLE_TABLET | Freq: Two times a day (BID) | ORAL | Status: DC

## 2013-12-15 MED ORDER — ESOMEPRAZOLE MAGNESIUM 40 MG PO CPDR: 40.0000 mg | DELAYED_RELEASE_CAPSULE | Freq: Two times a day (BID) | ORAL | Status: DC

## 2013-12-15 NOTE — Progress Notes (Signed)
Patient ID: [name], female   DOB: 1928/06/16, 80 y.o.   MRN: [id number]    Chief Complaint  Patient presents with  . Acute Visit    Depression off/on   . Burning Sensation    In stomach and throat x couple weeks    No Known Allergies  HPI 80 y/o female pt is here with her daughter for acute concerns. She resides in [facility]. She has been having burning sensation in throat and stomach for few weeks. She feels gasey and has been belching and passing flatus. Daughter also mentions pt has been feeling low and tearful for few weeks. She is not participating much in activities in building, misses home.  She has hx of HTN, migraine, cva, constipation, gerd, dm, dementia among others. POA Winifert Cogburn-Graves her daughter is here with her this visit Daughter would like echocardiogram result reviewed  Wt Readings from Last 3 Encounters:  12/15/13 166 lb (75.297 kg)  11/18/13 167 lb (75.751 kg)  07/21/13 157 lb (71.215 kg)   Review of Systems  Constitutional: Negative for fever, chills, malaise/fatigue and diaphoresis.  HENT: Negative for congestion.  Respiratory: Negative for cough, sputum production, wheezing.  SOB with exertion Cardiovascular: Negative for chest pain, palpitations. Has leg swelling. uses 2 pillows to sleep Gastrointestinal: Negative for nausea, vomiting, abdominal pain, diarrhea. Has constipation and is on laxatives. Has hx of hiatal hernia and reflux.  Genitourinary: Negative for dysuria, urgency, frequency and flank pain.  Musculoskeletal: Negative for back pain, falls.  Does not use assistive device. Uses ted hose. Has hx of bursitis and arthritis Skin: Negative for itching and rash.  Neurological: Negative for dizziness, tingling, focal weakness. has diabetic neuropathy of her feet. Occasional headache. Has history of migraine Psychiatric/Behavioral: positive for depression and memory loss. Denies insomnia  Past Medical History  Diagnosis Date  .  Memory loss 06/13/2009  . Irritable bowel syndrome 06/13/2009  . MIGRAINE HEADACHE 06/13/2009  . OSTEOARTHRITIS 06/13/2009  . HYPERTENSION 06/13/2009  . GERD Sept 2012    [facility], pH and manometry.. hiatal hernia present, resting pressure of LES hypotensive. normal peristalsis esophageal contractions. DeMeester score 0.2. Excellent control of GERD on PPI.   Marland Kitchen HYPERCHOLESTEROLEMIA 06/13/2009  . DM 06/13/2009   Current Outpatient Prescriptions on File Prior to Visit  Medication Sig Dispense Refill  . acarbose (PRECOSE) 25 MG tablet Take 1 tablet (25 mg total) by mouth 3 (three) times daily with meals.  90 tablet  11  . acetaminophen (TYLENOL) 500 MG tablet Take 500 mg by mouth every 6 (six) hours as needed for pain.      . Cholecalciferol (VITAMIN D) 2000 UNITS CAPS Take 2,000 Units by mouth daily.       . clonazePAM (KLONOPIN) 0.25 MG disintegrating tablet Take one tablet by mouth twice daily  60 tablet  5  . clopidogrel (PLAVIX) 75 MG tablet Take 1 tablet (75 mg total) by mouth daily with breakfast.  30 tablet  0  . linagliptin (TRADJENTA) 5 MG TABS tablet Take 1 tablet (5 mg total) by mouth daily.  30 tablet  6  . losartan (COZAAR) 50 MG tablet Take 1 tablet (50 mg total) by mouth daily.  30 tablet  4  . Memantine HCl ER 28 MG CP24 Take 28 mg by mouth daily.  30 capsule  3  . metFORMIN (GLUCOPHAGE) 500 MG tablet Take 1 tablet (500 mg total) by mouth 2 (two) times daily with a meal.  60 tablet  11  . QUEtiapine (SEROQUEL) 25 MG tablet Take 25 mg by mouth at bedtime.       . risperiDONE (RISPERDAL) 0.5 MG tablet Take 1 tablet (0.5 mg total) by mouth at bedtime.  30 tablet  0  . simvastatin (ZOCOR) 20 MG tablet Take 20 mg by mouth daily.      . sucralfate (CARAFATE) 1 GM/10ML suspension Take 10 mLs (1 g total) by mouth 4 (four) times daily.  420 mL  1  . verapamil (VERELAN PM) 120 MG 24 hr capsule Take 120 mg by mouth daily.        No current facility-administered medications on  file prior to visit.   Physical exam BP 142/80  Pulse 96  Temp(Src) 97.4 F (36.3 C) (Oral)  Resp 10  Wt 166 lb (75.297 kg)  SpO2 98%  General- elderly female in no acute distress Head- atraumatic, normocephalic Eyes- PERRLA, EOMI, no pallor, no icterus, no discharge Neck- no lymphadenopathy, no thyromegaly Mouth- normal mucus membrane Cardiovascular- normal s1,s2, no murmurs Respiratory- bilateral clear to auscultation, no wheeze, no rhonchi, no crackles Abdomen- bowel sounds present, soft, non tender, no epigastric tenderness Musculoskeletal- able to move all 4 extremities, 1+ edema Neurological- no focal deficit Skin- warm and dry Psychiatry- alert and oriented to person, place and time, normal mood and affect  Imaging 12/03/13: echocardiogram Impressions: - Normal LV function; moderate basal septal hypertrophy; grade 1   diastolic dysfunction; mildly calcified aortic valve with mild AS   and trace AI; mild MR; mildly elevated pulmonary pressures.  Assessment/plan  1. Type 2 diabetes mellitus with complication On multiple dm meds, last a1c several months back. Her symptom could be interaction of these multiple hypoglycemic agents. Recheck a1c. D/c starlix for now. On levemir, change this from 3 to 5 u for now, also change novolog to 5 u with meals for now for cbg > 150. Continue tradjenta, acarbose and metformin. Adjust further after reviewing a1c result. - Hemoglobin A1c - CMP - CBC with Differential  2. Need for prophylactic vaccination and inoculation against influenza Flu vaccine provided  3. Reflux disease With hiatal hernia. Appears to have worsened. Change nexium to 40 mg bid for now and continue carafate. Reassess  4. Flatulence Simethicone 125 mg bid for this and reassess  5. Depression Start celexa 20 mg daily and reassess

## 2013-12-16 LAB — CBC WITH DIFFERENTIAL/PLATELET
BASOS ABS: 0 10*3/uL (ref 0.0–0.2)
BASOS: 0 %
EOS ABS: 0.1 10*3/uL (ref 0.0–0.4)
Eos: 1 %
HCT: 34.6 % (ref 34.0–46.6)
HEMOGLOBIN: 11 g/dL — AB (ref 11.1–15.9)
Immature Grans (Abs): 0 10*3/uL (ref 0.0–0.1)
Immature Granulocytes: 0 %
LYMPHS ABS: 2.5 10*3/uL (ref 0.7–3.1)
Lymphs: 25 %
MCH: 24.3 pg — ABNORMAL LOW (ref 26.6–33.0)
MCHC: 31.8 g/dL (ref 31.5–35.7)
MCV: 77 fL — AB (ref 79–97)
Monocytes Absolute: 1.4 10*3/uL — ABNORMAL HIGH (ref 0.1–0.9)
Monocytes: 14 %
NEUTROS ABS: 5.9 10*3/uL (ref 1.4–7.0)
Neutrophils Relative %: 60 %
RBC: 4.52 x10E6/uL (ref 3.77–5.28)
RDW: 18 % — ABNORMAL HIGH (ref 12.3–15.4)
WBC: 9.9 10*3/uL (ref 3.4–10.8)

## 2013-12-16 LAB — COMPREHENSIVE METABOLIC PANEL
A/G RATIO: 1.5 (ref 1.1–2.5)
ALBUMIN: 4.2 g/dL (ref 3.5–4.7)
ALT: 10 IU/L (ref 0–32)
AST: 18 IU/L (ref 0–40)
Alkaline Phosphatase: 64 IU/L (ref 39–117)
BUN/Creatinine Ratio: 16 (ref 11–26)
BUN: 21 mg/dL (ref 8–27)
CALCIUM: 9.8 mg/dL (ref 8.7–10.3)
CO2: 20 mmol/L (ref 18–29)
CREATININE: 1.29 mg/dL — AB (ref 0.57–1.00)
Chloride: 102 mmol/L (ref 97–108)
GFR calc Af Amer: 45 mL/min/{1.73_m2} — ABNORMAL LOW (ref >59)
GFR calc non Af Amer: 39 mL/min/{1.73_m2} — ABNORMAL LOW (ref >59)
GLOBULIN, TOTAL: 2.8 g/dL (ref 1.5–4.5)
Glucose: 95 mg/dL (ref 65–99)
Potassium: 4.5 mmol/L (ref 3.5–5.2)
Sodium: 140 mmol/L (ref 134–144)
Total Bilirubin: 0.2 mg/dL (ref 0.0–1.2)
Total Protein: 7 g/dL (ref 6.0–8.5)

## 2013-12-16 LAB — HEMOGLOBIN A1C
ESTIMATED AVERAGE GLUCOSE: 171 mg/dL
Hgb A1c MFr Bld: 7.6 % — ABNORMAL HIGH (ref 4.8–5.6)

## 2013-12-16 NOTE — Telephone Encounter (Signed)
I called [facility] (per provider request), informed them medication list from our office is attached in patient's paperwork, patient was also given 2 new prescriptions.

## 2013-12-17 DIAGNOSIS — F32A Depression, unspecified: Secondary | ICD-10-CM | POA: Insufficient documentation

## 2013-12-17 DIAGNOSIS — F329 Major depressive disorder, single episode, unspecified: Secondary | ICD-10-CM | POA: Insufficient documentation

## 2013-12-17 DIAGNOSIS — R143 Flatulence: Secondary | ICD-10-CM | POA: Insufficient documentation

## 2013-12-17 DIAGNOSIS — E118 Type 2 diabetes mellitus with unspecified complications: Secondary | ICD-10-CM | POA: Insufficient documentation

## 2013-12-22 ENCOUNTER — Other Ambulatory Visit: Payer: Self-pay | Admitting: *Deleted

## 2013-12-22 MED ORDER — PANTOPRAZOLE SODIUM 20 MG PO TBEC: DELAYED_RELEASE_TABLET | ORAL | Status: DC

## 2013-12-22 NOTE — Telephone Encounter (Signed)
[facility] Medical Clarification Request stated Nexium may reduce the antiplatelet activity of Clopidogrel prescribed. Please consider switching to Pantoprazole, Lansoprazole, or Dexilant which have less CYP2C19 inhibitory activity. Dr. Glade LloydPandey Changed therapy to Pantoprazole 20mg  po daily [facility]

## 2013-12-28 ENCOUNTER — Other Ambulatory Visit: Payer: Self-pay | Admitting: *Deleted

## 2013-12-28 MED ORDER — CLONAZEPAM 0.5 MG PO TABS: ORAL_TABLET | ORAL | Status: DC

## 2013-12-28 NOTE — Telephone Encounter (Signed)
[facility] fax Order

## 2013-12-29 ENCOUNTER — Encounter: Payer: Self-pay | Admitting: Internal Medicine

## 2013-12-29 ENCOUNTER — Ambulatory Visit (INDEPENDENT_AMBULATORY_CARE_PROVIDER_SITE_OTHER): Payer: Medicare Other | Admitting: Internal Medicine

## 2013-12-29 VITALS — BP 160/88 | HR 89 | Temp 97.8°F | Resp 10 | Wt 163.0 lb

## 2013-12-29 DIAGNOSIS — N289 Disorder of kidney and ureter, unspecified: Secondary | ICD-10-CM

## 2013-12-29 DIAGNOSIS — D509 Iron deficiency anemia, unspecified: Secondary | ICD-10-CM | POA: Insufficient documentation

## 2013-12-29 DIAGNOSIS — E1129 Type 2 diabetes mellitus with other diabetic kidney complication: Secondary | ICD-10-CM | POA: Insufficient documentation

## 2013-12-29 DIAGNOSIS — F329 Major depressive disorder, single episode, unspecified: Secondary | ICD-10-CM

## 2013-12-29 DIAGNOSIS — R0981 Nasal congestion: Secondary | ICD-10-CM

## 2013-12-29 DIAGNOSIS — I10 Essential (primary) hypertension: Secondary | ICD-10-CM

## 2013-12-29 DIAGNOSIS — F32A Depression, unspecified: Secondary | ICD-10-CM

## 2013-12-29 MED ORDER — MENTHOL 3 MG MT LOZG: 1.0000 | LOZENGE | OROMUCOSAL | Status: AC | PRN

## 2013-12-29 MED ORDER — INSULIN ASPART 100 UNIT/ML ~~LOC~~ SOLN: SUBCUTANEOUS | Status: DC

## 2013-12-29 MED ORDER — CITALOPRAM HYDROBROMIDE 40 MG PO TABS: 40.0000 mg | ORAL_TABLET | Freq: Every day | ORAL | Status: AC

## 2013-12-29 MED ORDER — SALINE SPRAY 0.65 % NA SOLN: 1.0000 | Freq: Two times a day (BID) | NASAL | Status: DC

## 2013-12-29 NOTE — Progress Notes (Signed)
Patient ID: [name], female   DOB: [birth year], 80 y.o.   MRN: [id number]    Chief Complaint  Patient presents with  . Follow-up    Discuss labs (copy printed)   . URI    Congestion, headache (? sinus)  . Altered Mental Status    More confusion per daughter    No Known Allergies  HPI 80 y/o female patient is seen today for follow up on abnormal labs. Her blood work showed worsening kidney function and anemia cbg at facility [phone] with 2 readings of 174 in October Reviewed her medication list Daughter has found patient crying in the facility. She feels the mood medication has helped her some but not fully. She has dementia and is on seroquel and risperdal as well. Patient mentions feeling stuffed in her nose with clear discharge and scratchy in her throat. Denies sore throat. No cough.  Daughter mentions that on 2 ocassions in the past her mother's bp has been different in both arms and she would like that checked today  ROS Denies headache or dizziness Denies chest pain or dyspnea Denies change of vision Denies abdominal pain  Past Medical History  Diagnosis Date  . Memory loss 06/13/2009  . Irritable bowel syndrome 06/13/2009  . MIGRAINE HEADACHE 06/13/2009  . OSTEOARTHRITIS 06/13/2009  . HYPERTENSION 06/13/2009  . GERD Sept 2012    [facility], pH and manometry.. hiatal hernia present, resting pressure of LES hypotensive. normal peristalsis esophageal contractions. DeMeester score 0.2. Excellent control of GERD on PPI.   Marland Kitchen HYPERCHOLESTEROLEMIA 06/13/2009  . DM 06/13/2009   Current Outpatient Prescriptions on File Prior to Visit  Medication Sig Dispense Refill  . acarbose (PRECOSE) 25 MG tablet Take 1 tablet (25 mg total) by mouth 3 (three) times daily with meals.  90 tablet  11  . acetaminophen (TYLENOL) 500 MG tablet Take 500 mg by mouth every 6 (six) hours as needed for pain.      . Cholecalciferol (VITAMIN D) 2000 UNITS CAPS Take 2,000 Units by mouth daily.        . clonazePAM (KLONOPIN) 0.5 MG tablet Take 1/2 tablet by mouth twice daily  30 tablet  5  . clopidogrel (PLAVIX) 75 MG tablet Take 1 tablet (75 mg total) by mouth daily with breakfast.  30 tablet  0  . linagliptin (TRADJENTA) 5 MG TABS tablet Take 1 tablet (5 mg total) by mouth daily.  30 tablet  6  . losartan (COZAAR) 50 MG tablet Take 1 tablet (50 mg total) by mouth daily.  30 tablet  4  . Memantine HCl ER 28 MG CP24 Take 28 mg by mouth daily.  30 capsule  3  . metFORMIN (GLUCOPHAGE) 500 MG tablet Take 1 tablet (500 mg total) by mouth 2 (two) times daily with a meal.  60 tablet  11  . pregabalin (LYRICA) 50 MG capsule Take 50 mg by mouth 2 (two) times daily. And 1 by mouth at bedtime      . QUEtiapine (SEROQUEL) 25 MG tablet Take 25 mg by mouth at bedtime.       . risperiDONE (RISPERDAL) 0.5 MG tablet Take 1 tablet (0.5 mg total) by mouth at bedtime.  30 tablet  0  . simethicone (MYLICON) 125 MG chewable tablet Chew 1 tablet (125 mg total) by mouth 2 (two) times daily.  30 tablet  3  . simvastatin (ZOCOR) 20 MG tablet Take 20 mg by mouth daily.      Marland Kitchen  sucralfate (CARAFATE) 1 GM/10ML suspension Take 10 mLs (1 g total) by mouth 4 (four) times daily.  420 mL  1  . traMADol-acetaminophen (ULTRACET) 37.5-325 MG per tablet Take 1 tablet by mouth every 6 (six) hours as needed.      . verapamil (VERELAN PM) 120 MG 24 hr capsule Take 120 mg by mouth daily.        No current facility-administered medications on file prior to visit.   Physical exam BP 160/88  Pulse 89  Temp(Src) 97.8 F (36.6 C) (Oral)  Resp 10  Wt 163 lb (73.936 kg)  SpO2 97%  Right arm bp 174/88, left arm bp 158/80  General- elderly female in no acute distress Head- atraumatic, normocephalic Eyes- no pallor, no icterus, no discharge Neck- no cervical lymphadenopathy, no thyromegaly Nose- no sinus tenderness, erythematous nasal mucosa with clear discharge Mouth- normal mucus membrane Cardiovascular- normal s1,s2, no  murmurs Respiratory- bilateral clear to auscultation, no wheeze, no rhonchi, no crackles Abdomen- bowel sounds present, soft, non tender Musculoskeletal- able to move all 4 extremities Neurological- no focal deficit Skin- warm and dry Psychiatry- alert and oriented with somewhat flat affect   Labs- Lab Results  Component Value Date   HGBA1C 7.6* 12/15/2013   CMP     Component Value Date/Time   NA 140 12/15/2013 1435   NA 136 12/17/2012 1351   K 4.5 12/15/2013 1435   CL 102 12/15/2013 1435   CO2 20 12/15/2013 1435   GLUCOSE 95 12/15/2013 1435   GLUCOSE 129* 12/17/2012 1351   BUN 21 12/15/2013 1435   BUN 22 12/17/2012 1351   CREATININE 1.29* 12/15/2013 1435   CREATININE 1.07 06/06/2012 0931   CALCIUM 9.8 12/15/2013 1435   PROT 7.0 12/15/2013 1435   PROT 7.3 12/17/2012 1351   ALBUMIN 3.8 12/17/2012 1351   AST 18 12/15/2013 1435   AST 17 12/12/2011 1400   ALT 10 12/15/2013 1435   ALKPHOS 64 12/15/2013 1435   ALKPHOS 37 12/12/2011 1400   BILITOT 0.2 12/15/2013 1435   BILITOT 0.5 12/12/2011 1400   GFRNONAA 39* 12/15/2013 1435   GFRAA 45* 12/15/2013 1435   CBC Latest Ref Rng 12/15/2013 12/17/2012 12/17/2012  WBC 3.4 - 10.8 x10E3/uL 9.9 5.7 6.1  Hemoglobin 11.1 - 15.9 g/dL 11.0(L) 11.5(L) 11.0(L)  Hematocrit 34.0 - 46.6 % 34.6 33.2(L) 33.2(L)  Platelets 150 - 400 K/uL - 218 203   Imaging 12/03/13: echocardiogram Impressions: - Normal LV function; moderate basal septal hypertrophy; grade 1   diastolic dysfunction; mildly calcified aortic valve with mild AS   and trace AI; mild MR; mildly elevated pulmonary pressures.  Assessment/plan  1. Type 2 diabetes mellitus with renal disease On acarbose 25 mg tid, levemir 3 u, novolog 7 u tid with 5 u prn for cbg > 150, tradjenta 5 mg daily, metformin 500 mg bid. Reviewed cbg and a1c. Will d/c levemir for now and change novolog to 5 u for cbg > 150 only. Continue tradjenta, acarbose and metformin for now. Will recheck renal function on next visit and if  kidney function remains impaired or has worsened, d/c metformin. Monitor cbg for now  2. Renal impairment Likely in setting of her DM and HTN. Consider discontinuation of metformin if repeat check shows worsening kidney function. Continue losartan for now  3. Microcytic Anemia Likely for CKD. Check iron panel and erythropoetin  4. Nasal congestion Likely allergic, Will treat her with flonase and prn cepacol and reassess  5. Depression Increase celexa to  40 mg daily and reassess  6. Hypertension With difference in SBp of > 10 mm in both arms. Remains asymptomatic. Continue losartan current regimen. bp in office on higher side but pt has nasal congestion, feeling low and tearful today and this could be contributing further. Will need bp readings from facility to review further. Monitor bp readings in facility. On next visit if patient has difference in bp in both arms, consider workup for upper extremity peripheral arterial disease like subclavian stenosis and coarctation of aorta. No signs of aortic dissection at present. Important to have bp and sugar under control

## 2013-12-30 LAB — FERRITIN: Ferritin: 17 ng/mL (ref 15–150)

## 2013-12-30 LAB — IRON AND TIBC
IRON SATURATION: 5 % — AB (ref 15–55)
Iron: 19 ug/dL — ABNORMAL LOW (ref 35–155)
TIBC: 349 ug/dL (ref 250–450)
UIBC: 330 ug/dL (ref 150–375)

## 2013-12-30 LAB — ERYTHROPOIETIN: ERYTHROPOIETIN: 36.2 m[IU]/mL — AB (ref 2.6–18.5)

## 2013-12-31 ENCOUNTER — Telehealth: Payer: Self-pay | Admitting: *Deleted

## 2013-12-31 MED ORDER — FERROUS SULFATE 325 (65 FE) MG PO TABS: 325.0000 mg | ORAL_TABLET | Freq: Three times a day (TID) | ORAL | Status: DC

## 2013-12-31 NOTE — Telephone Encounter (Signed)
Left message for daughter to return call regarding her mother lab results. Received a call back from her daughter Engineer, maintenance (IT)(Winifert) informed her that her mother iron level was extremely low and would be sending a prescription to the pharmacy.

## 2013-12-31 NOTE — Telephone Encounter (Signed)
Message copied by Lamont SnowballICE, Copelyn Widmer L on Thu Dec 31, 2013 11:07 AM ------      Message from: Oneal GroutPANDEY, MAHIMA      Created: Wed Dec 30, 2013  5:30 PM       Extremely low iron level. Start her on ferrous sulfate 325 mg three times a day for now ------

## 2014-01-04 ENCOUNTER — Telehealth: Payer: Self-pay

## 2014-01-04 NOTE — Telephone Encounter (Signed)
Left message on voicemail for patient to return call when available, reason for call- discuss labs   It appears Courtney DecemberSharon [facility]) spoke with patient's daughter on 12/31/13, left another message for patient's daughter to dis-regard phone call

## 2014-01-04 NOTE — Telephone Encounter (Signed)
Message copied by Maurice SmallBEATTY, Terran Hollenkamp C on Mon Jan 04, 2014 11:22 AM ------      Message from: Oneal GroutPANDEY, MAHIMA      Created: Wed Dec 30, 2013  5:30 PM       Extremely low iron level. Start her on ferrous sulfate 325 mg three times a day for now ------

## 2014-01-11 ENCOUNTER — Inpatient Hospital Stay (HOSPITAL_COMMUNITY)
Admission: EM | Admit: 2014-01-11 | Discharge: 2014-01-17 | DRG: 064 | Disposition: E | Payer: Medicare Other | Attending: Internal Medicine | Admitting: Internal Medicine

## 2014-01-11 ENCOUNTER — Encounter (HOSPITAL_COMMUNITY): Payer: Self-pay | Admitting: *Deleted

## 2014-01-11 ENCOUNTER — Emergency Department (HOSPITAL_COMMUNITY): Payer: Medicare Other

## 2014-01-11 DIAGNOSIS — G629 Polyneuropathy, unspecified: Secondary | ICD-10-CM

## 2014-01-11 DIAGNOSIS — R414 Neurologic neglect syndrome: Secondary | ICD-10-CM | POA: Diagnosis present

## 2014-01-11 DIAGNOSIS — I10 Essential (primary) hypertension: Secondary | ICD-10-CM | POA: Diagnosis present

## 2014-01-11 DIAGNOSIS — E118 Type 2 diabetes mellitus with unspecified complications: Secondary | ICD-10-CM

## 2014-01-11 DIAGNOSIS — K5900 Constipation, unspecified: Secondary | ICD-10-CM

## 2014-01-11 DIAGNOSIS — E1165 Type 2 diabetes mellitus with hyperglycemia: Secondary | ICD-10-CM | POA: Diagnosis present

## 2014-01-11 DIAGNOSIS — K219 Gastro-esophageal reflux disease without esophagitis: Secondary | ICD-10-CM | POA: Diagnosis present

## 2014-01-11 DIAGNOSIS — E780 Pure hypercholesterolemia: Secondary | ICD-10-CM | POA: Diagnosis present

## 2014-01-11 DIAGNOSIS — E785 Hyperlipidemia, unspecified: Secondary | ICD-10-CM | POA: Diagnosis present

## 2014-01-11 DIAGNOSIS — I615 Nontraumatic intracerebral hemorrhage, intraventricular: Principal | ICD-10-CM

## 2014-01-11 DIAGNOSIS — I161 Hypertensive emergency: Secondary | ICD-10-CM | POA: Diagnosis present

## 2014-01-11 DIAGNOSIS — Z79899 Other long term (current) drug therapy: Secondary | ICD-10-CM | POA: Diagnosis not present

## 2014-01-11 DIAGNOSIS — G935 Compression of brain: Secondary | ICD-10-CM | POA: Diagnosis present

## 2014-01-11 DIAGNOSIS — E1142 Type 2 diabetes mellitus with diabetic polyneuropathy: Secondary | ICD-10-CM

## 2014-01-11 DIAGNOSIS — R410 Disorientation, unspecified: Secondary | ICD-10-CM

## 2014-01-11 DIAGNOSIS — I610 Nontraumatic intracerebral hemorrhage in hemisphere, subcortical: Secondary | ICD-10-CM

## 2014-01-11 DIAGNOSIS — Z515 Encounter for palliative care: Secondary | ICD-10-CM | POA: Diagnosis not present

## 2014-01-11 DIAGNOSIS — Z8673 Personal history of transient ischemic attack (TIA), and cerebral infarction without residual deficits: Secondary | ICD-10-CM | POA: Diagnosis not present

## 2014-01-11 DIAGNOSIS — Z66 Do not resuscitate: Secondary | ICD-10-CM | POA: Diagnosis present

## 2014-01-11 DIAGNOSIS — Z794 Long term (current) use of insulin: Secondary | ICD-10-CM

## 2014-01-11 DIAGNOSIS — I619 Nontraumatic intracerebral hemorrhage, unspecified: Secondary | ICD-10-CM | POA: Diagnosis present

## 2014-01-11 DIAGNOSIS — Z833 Family history of diabetes mellitus: Secondary | ICD-10-CM

## 2014-01-11 DIAGNOSIS — G8194 Hemiplegia, unspecified affecting left nondominant side: Secondary | ICD-10-CM | POA: Diagnosis present

## 2014-01-11 DIAGNOSIS — I609 Nontraumatic subarachnoid hemorrhage, unspecified: Secondary | ICD-10-CM | POA: Diagnosis present

## 2014-01-11 DIAGNOSIS — F0390 Unspecified dementia without behavioral disturbance: Secondary | ICD-10-CM | POA: Diagnosis present

## 2014-01-11 DIAGNOSIS — G936 Cerebral edema: Secondary | ICD-10-CM | POA: Diagnosis present

## 2014-01-11 DIAGNOSIS — M1990 Unspecified osteoarthritis, unspecified site: Secondary | ICD-10-CM | POA: Diagnosis present

## 2014-01-11 DIAGNOSIS — J9600 Acute respiratory failure, unspecified whether with hypoxia or hypercapnia: Secondary | ICD-10-CM | POA: Diagnosis present

## 2014-01-11 DIAGNOSIS — Z7902 Long term (current) use of antithrombotics/antiplatelets: Secondary | ICD-10-CM | POA: Diagnosis not present

## 2014-01-11 DIAGNOSIS — I618 Other nontraumatic intracerebral hemorrhage: Secondary | ICD-10-CM | POA: Diagnosis present

## 2014-01-11 DIAGNOSIS — I612 Nontraumatic intracerebral hemorrhage in hemisphere, unspecified: Secondary | ICD-10-CM

## 2014-01-11 DIAGNOSIS — E871 Hypo-osmolality and hyponatremia: Secondary | ICD-10-CM | POA: Diagnosis present

## 2014-01-11 DIAGNOSIS — F32A Depression, unspecified: Secondary | ICD-10-CM

## 2014-01-11 DIAGNOSIS — Z01818 Encounter for other preprocedural examination: Secondary | ICD-10-CM

## 2014-01-11 DIAGNOSIS — F329 Major depressive disorder, single episode, unspecified: Secondary | ICD-10-CM

## 2014-01-11 HISTORY — DX: Transient cerebral ischemic attack, unspecified: G45.9

## 2014-01-11 HISTORY — DX: Nontraumatic intracerebral hemorrhage, unspecified: I61.9

## 2014-01-11 LAB — GLUCOSE, CAPILLARY
GLUCOSE-CAPILLARY: 443 mg/dL — AB (ref 70–99)
Glucose-Capillary: 424 mg/dL — ABNORMAL HIGH (ref 70–99)
Glucose-Capillary: 408 mg/dL — ABNORMAL HIGH (ref 70–99)
Glucose-Capillary: 348 mg/dL — ABNORMAL HIGH (ref 70–99)

## 2014-01-11 LAB — COMPREHENSIVE METABOLIC PANEL
ALT: 13 U/L (ref 0–35)
ANION GAP: 16 — AB (ref 5–15)
AST: 22 U/L (ref 0–37)
Albumin: 3.8 g/dL (ref 3.5–5.2)
Alkaline Phosphatase: 71 U/L (ref 39–117)
BUN: 17 mg/dL (ref 6–23)
CO2: 21 mEq/L (ref 19–32)
Calcium: 9.5 mg/dL (ref 8.4–10.5)
Chloride: 102 mEq/L (ref 96–112)
Creatinine, Ser: 0.87 mg/dL (ref 0.50–1.10)
GFR calc Af Amer: 70 mL/min — ABNORMAL LOW (ref >90)
GFR calc non Af Amer: 60 mL/min — ABNORMAL LOW (ref >90)
GLUCOSE: 272 mg/dL — AB (ref 70–99)
POTASSIUM: 3.8 meq/L (ref 3.7–5.3)
Sodium: 139 mEq/L (ref 137–147)
TOTAL PROTEIN: 7.8 g/dL (ref 6.0–8.3)
Total Bilirubin: 0.5 mg/dL (ref 0.3–1.2)

## 2014-01-11 LAB — URINALYSIS, ROUTINE W REFLEX MICROSCOPIC
Bilirubin Urine: NEGATIVE
GLUCOSE, UA: 500 mg/dL — AB
Ketones, ur: NEGATIVE mg/dL
LEUKOCYTES UA: NEGATIVE
Nitrite: NEGATIVE
Protein, ur: 100 mg/dL — AB
Specific Gravity, Urine: 1.012 (ref 1.005–1.030)
Urobilinogen, UA: 0.2 mg/dL (ref 0.0–1.0)
pH: 7.5 (ref 5.0–8.0)

## 2014-01-11 LAB — TYPE AND SCREEN
ABO/RH(D): O POS
Antibody Screen: NEGATIVE

## 2014-01-11 LAB — I-STAT CHEM 8, ED
BUN: 18 mg/dL (ref 6–23)
CALCIUM ION: 1.15 mmol/L (ref 1.13–1.30)
Chloride: 105 mEq/L (ref 96–112)
Creatinine, Ser: 0.9 mg/dL (ref 0.50–1.10)
GLUCOSE: 279 mg/dL — AB (ref 70–99)
HEMATOCRIT: 41 % (ref 36.0–46.0)
HEMOGLOBIN: 13.9 g/dL (ref 12.0–15.0)
Potassium: 3.5 mEq/L — ABNORMAL LOW (ref 3.7–5.3)
Sodium: 139 mEq/L (ref 137–147)
TCO2: 22 mmol/L (ref 0–100)

## 2014-01-11 LAB — URINE MICROSCOPIC-ADD ON

## 2014-01-11 LAB — CBC
HCT: 36.4 % (ref 36.0–46.0)
HEMOGLOBIN: 11.7 g/dL — AB (ref 12.0–15.0)
MCH: 23.9 pg — ABNORMAL LOW (ref 26.0–34.0)
MCHC: 32.1 g/dL (ref 30.0–36.0)
MCV: 74.3 fL — AB (ref 78.0–100.0)
Platelets: 235 10*3/uL (ref 150–400)
RBC: 4.9 MIL/uL (ref 3.87–5.11)
RDW: 18 % — ABNORMAL HIGH (ref 11.5–15.5)
WBC: 12.4 10*3/uL — AB (ref 4.0–10.5)

## 2014-01-11 LAB — I-STAT TROPONIN, ED: Troponin i, poc: 0 ng/mL (ref 0.00–0.08)

## 2014-01-11 LAB — RAPID URINE DRUG SCREEN, HOSP PERFORMED
Amphetamines: NOT DETECTED
BENZODIAZEPINES: NOT DETECTED
Barbiturates: NOT DETECTED
Cocaine: NOT DETECTED
Opiates: NOT DETECTED
Tetrahydrocannabinol: NOT DETECTED

## 2014-01-11 LAB — MRSA PCR SCREENING: MRSA BY PCR: POSITIVE — AB

## 2014-01-11 LAB — DIFFERENTIAL
Basophils Absolute: 0 10*3/uL (ref 0.0–0.1)
Basophils Relative: 0 % (ref 0–1)
EOS ABS: 0.2 10*3/uL (ref 0.0–0.7)
Eosinophils Relative: 1 % (ref 0–5)
LYMPHS ABS: 5.2 10*3/uL — AB (ref 0.7–4.0)
LYMPHS PCT: 42 % (ref 12–46)
MONOS PCT: 8 % (ref 3–12)
Monocytes Absolute: 1 10*3/uL (ref 0.1–1.0)
NEUTROS PCT: 49 % (ref 43–77)
Neutro Abs: 6 10*3/uL (ref 1.7–7.7)

## 2014-01-11 LAB — PROTIME-INR
INR: 1.02 (ref 0.00–1.49)
Prothrombin Time: 13.5 seconds (ref 11.6–15.2)

## 2014-01-11 LAB — I-STAT ARTERIAL BLOOD GAS, ED
BICARBONATE: 25 meq/L — AB (ref 20.0–24.0)
O2 Saturation: 100 %
PH ART: 7.382 (ref 7.350–7.450)
Patient temperature: 98.6
TCO2: 26 mmol/L (ref 0–100)
pCO2 arterial: 42 mmHg (ref 35.0–45.0)
pO2, Arterial: 307 mmHg — ABNORMAL HIGH (ref 80.0–100.0)

## 2014-01-11 LAB — ETHANOL: Alcohol, Ethyl (B): <11 mg/dL (ref 0–11)

## 2014-01-11 LAB — ABO/RH: ABO/RH(D): O POS

## 2014-01-11 LAB — GLUCOSE, RANDOM: Glucose, Bld: 461 mg/dL — ABNORMAL HIGH (ref 70–99)

## 2014-01-11 LAB — APTT: aPTT: 21 seconds — ABNORMAL LOW (ref 24–37)

## 2014-01-11 MED ORDER — MUPIROCIN 2 % EX OINT
1.0000 "application " | TOPICAL_OINTMENT | Freq: Two times a day (BID) | CUTANEOUS | Status: DC
  Administered 2014-01-11 – 2014-01-12 (×3): 1 via NASAL
  Filled 2014-01-11 (×2): qty 22

## 2014-01-11 MED ORDER — SODIUM CHLORIDE 0.9 % IV SOLN
100.0000 ug/h | INTRAVENOUS | Status: DC
  Administered 2014-01-11: 100 ug/h via INTRAVENOUS
  Filled 2014-01-11: qty 50

## 2014-01-11 MED ORDER — INSULIN ASPART 100 UNIT/ML ~~LOC~~ SOLN
10.0000 [IU] | Freq: Once | SUBCUTANEOUS | Status: AC
  Administered 2014-01-11: 10 [IU] via INTRAVENOUS

## 2014-01-11 MED ORDER — ROCURONIUM BROMIDE 50 MG/5ML IV SOLN
INTRAVENOUS | Status: AC | PRN
  Administered 2014-01-11: 100 mg via INTRAVENOUS

## 2014-01-11 MED ORDER — ROCURONIUM BROMIDE 50 MG/5ML IV SOLN
1.0000 mg/kg | Freq: Once | INTRAVENOUS | Status: AC
  Filled 2014-01-11: qty 7.39

## 2014-01-11 MED ORDER — PANTOPRAZOLE SODIUM 40 MG IV SOLR
40.0000 mg | Freq: Every day | INTRAVENOUS | Status: DC
  Administered 2014-01-11 – 2014-01-12 (×2): 40 mg via INTRAVENOUS
  Filled 2014-01-11 (×3): qty 40

## 2014-01-11 MED ORDER — CETYLPYRIDINIUM CHLORIDE 0.05 % MT LIQD
7.0000 mL | Freq: Four times a day (QID) | OROMUCOSAL | Status: DC
  Administered 2014-01-11 – 2014-01-13 (×8): 7 mL via OROMUCOSAL

## 2014-01-11 MED ORDER — ETOMIDATE 2 MG/ML IV SOLN
INTRAVENOUS | Status: AC
  Filled 2014-01-11: qty 20

## 2014-01-11 MED ORDER — INSULIN ASPART 100 UNIT/ML ~~LOC~~ SOLN
0.0000 [IU] | SUBCUTANEOUS | Status: DC
  Administered 2014-01-11: 11 [IU] via SUBCUTANEOUS
  Administered 2014-01-12 (×2): 5 [IU] via SUBCUTANEOUS
  Administered 2014-01-12: 8 [IU] via SUBCUTANEOUS
  Administered 2014-01-12 – 2014-01-13 (×5): 5 [IU] via SUBCUTANEOUS

## 2014-01-11 MED ORDER — ETOMIDATE 2 MG/ML IV SOLN: 20.0000 mg | Freq: Once | INTRAVENOUS | Status: AC

## 2014-01-11 MED ORDER — SODIUM CHLORIDE 0.9 % IV SOLN
0.0000 ug/h | INTRAVENOUS | Status: DC
  Administered 2014-01-11: 50 ug/h via INTRAVENOUS
  Filled 2014-01-11 (×2): qty 50

## 2014-01-11 MED ORDER — INSULIN ASPART 100 UNIT/ML ~~LOC~~ SOLN
10.0000 [IU] | Freq: Once | SUBCUTANEOUS | Status: AC
  Administered 2014-01-11: 10 [IU] via SUBCUTANEOUS

## 2014-01-11 MED ORDER — FENTANYL BOLUS VIA INFUSION
25.0000 ug | INTRAVENOUS | Status: DC | PRN
  Filled 2014-01-11: qty 50

## 2014-01-11 MED ORDER — SODIUM CHLORIDE 0.9 % IV SOLN
10.0000 mL/h | Freq: Once | INTRAVENOUS | Status: AC
  Administered 2014-01-11: 10 mL/h via INTRAVENOUS

## 2014-01-11 MED ORDER — ROCURONIUM BROMIDE 50 MG/5ML IV SOLN
INTRAVENOUS | Status: AC
  Filled 2014-01-11: qty 2

## 2014-01-11 MED ORDER — SODIUM CHLORIDE 0.9 % IV SOLN: 250.0000 mL | INTRAVENOUS | Status: DC | PRN

## 2014-01-11 MED ORDER — SUCCINYLCHOLINE CHLORIDE 20 MG/ML IJ SOLN
INTRAMUSCULAR | Status: AC
  Filled 2014-01-11: qty 1

## 2014-01-11 MED ORDER — NICARDIPINE HCL IN NACL 20-0.86 MG/200ML-% IV SOLN
5.0000 mg/h | Freq: Once | INTRAVENOUS | Status: AC
  Administered 2014-01-11: 5 mg/h via INTRAVENOUS

## 2014-01-11 MED ORDER — CHLORHEXIDINE GLUCONATE 0.12 % MT SOLN
15.0000 mL | Freq: Two times a day (BID) | OROMUCOSAL | Status: DC
  Administered 2014-01-11 – 2014-01-13 (×5): 15 mL via OROMUCOSAL
  Filled 2014-01-11 (×5): qty 15

## 2014-01-11 MED ORDER — CHLORHEXIDINE GLUCONATE CLOTH 2 % EX PADS
6.0000 | MEDICATED_PAD | Freq: Every day | CUTANEOUS | Status: DC
  Administered 2014-01-11 – 2014-01-13 (×3): 6 via TOPICAL

## 2014-01-11 MED ORDER — NICARDIPINE HCL IN NACL 20-0.86 MG/200ML-% IV SOLN
INTRAVENOUS | Status: AC
  Filled 2014-01-11: qty 200

## 2014-01-11 MED ORDER — SODIUM CHLORIDE 0.9 % IV SOLN
INTRAVENOUS | Status: DC
  Administered 2014-01-11: 10:00:00 via INTRAVENOUS

## 2014-01-11 MED ORDER — NICARDIPINE HCL IN NACL 20-0.86 MG/200ML-% IV SOLN
3.0000 mg/h | INTRAVENOUS | Status: DC
  Administered 2014-01-11 – 2014-01-12 (×5): 5 mg/h via INTRAVENOUS
  Administered 2014-01-12: 10 mg/h via INTRAVENOUS
  Administered 2014-01-12 (×2): 5 mg/h via INTRAVENOUS
  Administered 2014-01-12: 3 mg/h via INTRAVENOUS
  Administered 2014-01-13: 4 mg/h via INTRAVENOUS
  Administered 2014-01-13: 3 mg/h via INTRAVENOUS
  Filled 2014-01-11 (×2): qty 400
  Filled 2014-01-11 (×6): qty 200

## 2014-01-11 MED ORDER — NICARDIPINE HCL IN NACL 20-0.86 MG/200ML-% IV SOLN
3.0000 mg/h | Freq: Once | INTRAVENOUS | Status: DC
  Filled 2014-01-11: qty 200

## 2014-01-11 MED ORDER — LIDOCAINE HCL (CARDIAC) 20 MG/ML IV SOLN
INTRAVENOUS | Status: AC
Start: 2014-01-11 — End: 2014-01-11
  Filled 2014-01-11: qty 5

## 2014-01-11 MED ORDER — ETOMIDATE 2 MG/ML IV SOLN
INTRAVENOUS | Status: AC | PRN
  Administered 2014-01-11: 20 mg via INTRAVENOUS

## 2014-01-11 NOTE — Consult Note (Signed)
Referring Physician: Mora BellmanNI, A    Chief Complaint: New onset acute left hemiplegia.  HPI: [name] is an 80 y.o. female history diabetes mellitus, hypertension and hyperlipidemia as well as dementia and previous stroke presenting with new onset gaze to the right, inability to speak and left hemiplegia. Onset was at 5:30 AM today. Patient has been on Plavix 75 mg per day. On arrival in the emergency room she was intubated for airway protection. CT scan of the head showed large right hemispheric intraparenchymal hemorrhage with ventricular extension as well as midline shift. NIH stroke score was 21. Blood pressure was markedly elevated at 235/110. Patient was started on Cardene drip IV.  LSN: 5:30 AM on 12/19/2013 tPA Given: No: Acute ICH mRankin:  Past Medical History  Diagnosis Date  . Memory loss 06/13/2009  . Irritable bowel syndrome 06/13/2009  . MIGRAINE HEADACHE 06/13/2009  . OSTEOARTHRITIS 06/13/2009  . HYPERTENSION 06/13/2009  . GERD Sept 2012    [facility], pH and manometry.. hiatal hernia present, resting pressure of LES hypotensive. normal peristalsis esophageal contractions. DeMeester score 0.2. Excellent control of GERD on PPI.   [facility] Kitchen. HYPERCHOLESTEROLEMIA 06/13/2009  . DM 06/13/2009    Family History  Problem Relation Age of Onset  . Diabetes Father   . Diabetes Sister   . Colon cancer Brother     deceased at age  78, diagnosed age late 1960s.      Medications: I have reviewed the patient's current medications.  ROS: Unavailable due to patient's mental status.  Physical Examination: Blood pressure 237/133, pulse 84, resp. rate 20, height 5' 6.54" (1.69 m), weight 73.936 kg (163 lb), SpO2 99.00%.  Neurologic Examination: Patient was lethargic but able to follow commands with use of right extremities. She had no speech output and had complete neglect of left side. Pupils were equal and reacted normally to light. Extraocular movements were limited to right side  and partial movement towards the left but not beyond midline conjugately. Left facial weakness noted. Motor exam showed flaccid hemiplegia on the left. Strength of right extremities was normal. Deep tendon reflexes were 1+ and symmetrical. Plantar response on the left was extensor, and on the right, flexor. Patient had marked sensory neglect of left side.  No results found.  Assessment: 80 y.o. female 80 year old lady with hypertension, hyperlipidemia and diabetes mellitus as well as dementia, presenting with acute right intraparenchymal large hemorrhage with mass effect and extension into ventricular system, as well as hypertensive emergency. Prognosis is poor for recovery to functional independence, given the extent of acute hemorrhagic stroke as well as underlying dementia.  Stroke Risk Factors - diabetes mellitus, hyperlipidemia and hypertension  Plan: 1. Maintain systolic blood pressure below 161160, and diastolic pressure below 90. 2. MRI, MRA  of the brain without contrast when feasible 3. PT consult, OT consult, Speech consult when feasible 4. Echocardiogram 5. Carotid dopplers 6. Prophylactic therapy-None 7. Aggressive therapy to the extent of family's wishes, given the patient's poor prognosis.   C.R. Roseanne RenoStewart, MD [facility] [phone]  01/06/2014, 6:39 AM

## 2014-01-11 NOTE — ED Notes (Signed)
Family at bedside. 

## 2014-01-11 NOTE — ED Notes (Signed)
Patient resting comfortably at this time, family at bedside, awaiting room assignment

## 2014-01-11 NOTE — Progress Notes (Signed)
CBG 424, MD aware, STAT lab ordered.

## 2014-01-11 NOTE — Progress Notes (Signed)
eLink Physician-Brief Progress Note Patient Name: Kathrene AluDora A Wallenstein DOB: 05/06/1928 MRN: [id number]   Date of Service  01/13/2014  HPI/Events of Note  Severe hyperglycemia Poor prognosis  eICU Interventions  Insulin 10 units IV Insulin 10 units SQ Cont SSI as ordered thereafter     Intervention Category Major Interventions: Hyperglycemia - active titration of insulin therapy  Billy FischerDavid Simonds 12/22/2013, 5:56 PM

## 2014-01-11 NOTE — ED Notes (Signed)
Per EMS: [facility] with c/o stroke symptoms. Staff reports hearing a "thud" in the bathroom. Tech checked on pt, pt c/o headache and dizziness but A&Ox4. Staff reported pt suddenly became non verbal, incontinent of stool and presenting with increase altered mental status. Upon EMS arrival pt had left sided weakness, left sided facial droop and right sided gaze.

## 2014-01-11 NOTE — H&P (Addendum)
PULMONARY / CRITICAL CARE MEDICINE   Name: Courtney AluDora A [name] MRN: [id number] DOB: 10/07/1928    ADMISSION DATE:  01/05/2014  REFERRING MD :  Roseanne RenoStewart   CHIEF COMPLAINT:  ICH  INITIAL PRESENTATION: 80 yo female with hx HTN, DM, previous stroke, mild dementia presented from [place].  Staff heard a "thud" and found pt altered, non-verbal and incontinent.  Mental status continued to deteriorate with R gaze, L sided weakness and required intubation on arrival to ER for airway protection.  BP was markedly elevted with SBP >230, improved with cardene gtt.  CT head showed large R hemispheric hemorrhage with midline shift.  PCCM called to admit.   STUDIES:  CT head 10/26>>>Large intraparenchymal hematoma involving the right posterior parietal lobe with associated SAH and IVH. This could be a hypertensive hemorrhage or possibly a hemorrhagic infarct. There is 7.5 mm of right to left shift  SIGNIFICANT EVENTS: 10/26>> admitted, large ICH, d/w daughter, DNR, no escalation    HISTORY OF PRESENT ILLNESS:   80 yo female with hx HTN, DM, previous stroke, mild dementia presented from [place].  Staff heard a "thud" and found pt altered, non-verbal and incontinent.  Mental status continued to deteriorate with R gaze, L sided weakness and required intubation on arrival to ER for airway protection.  BP was markedly elevted with SBP >230, improved with cardene gtt.  CT head showed large R hemispheric hemorrhage with midline shift.   Pts baseline dementia seems to be strictly "memory loss".  She is able to dress, bathe, feed herself, go out shopping with her daughter, etc.  Had a previous hemorrhagic stroke in 2014.     PAST MEDICAL HISTORY :   has a past medical history of Memory loss (06/13/2009); Irritable bowel syndrome (06/13/2009); MIGRAINE HEADACHE (06/13/2009); OSTEOARTHRITIS (06/13/2009); HYPERTENSION (06/13/2009); GERD (Sept 2012); HYPERCHOLESTEROLEMIA (06/13/2009); and DM (06/13/2009).   has past surgical history that  includes Appendectomy; Esophagogastroduodenoscopy (03/30/2003); Colonoscopy (03/30/2003); Colonoscopy (June 2006); 24 hour ph study (Sept 2012); and Esophageal manometry. Prior to Admission medications   Medication Sig Start Date End Date Taking? Authorizing Provider  acarbose (PRECOSE) 25 MG tablet Take 1 tablet (25 mg total) by mouth 3 (three) times daily with meals. 08/26/12  Yes Romero BellingSean Ellison, MD  acetaminophen (TYLENOL) 500 MG tablet Take 500 mg by mouth 2 (two) times daily.   Yes Historical Provider, MD  calcium carbonate (TUMS - DOSED IN MG ELEMENTAL CALCIUM) 500 MG chewable tablet Chew 1 tablet by mouth daily as needed for indigestion or heartburn.   Yes Historical Provider, MD  Cholecalciferol (VITAMIN D) 2000 UNITS CAPS Take 2,000 Units by mouth daily.    Yes Historical Provider, MD  citalopram (CELEXA) 40 MG tablet Take 1 tablet (40 mg total) by mouth daily. 12/29/13  Yes Mahima Pandey, MD  clonazePAM (KLONOPIN) 0.5 MG tablet Take 0.25 mg by mouth 2 (two) times daily.   Yes Historical Provider, MD  clopidogrel (PLAVIX) 75 MG tablet Take 1 tablet (75 mg total) by mouth daily with breakfast. 12/23/12  Yes Belkys A [name], MD  esomeprazole (NEXIUM) 20 MG capsule Take 20 mg by mouth daily at 12 noon.   Yes Historical Provider, MD  insulin aspart (NOVOLOG) 100 UNIT/ML injection Inject 5 Units into the skin 3 (three) times daily before meals. If CBG >150   Yes Historical Provider, MD  linagliptin (TRADJENTA) 5 MG TABS tablet Take 1 tablet (5 mg total) by mouth daily. 10/31/12  Yes Romero BellingSean Ellison, MD  losartan (COZAAR) 50 MG  tablet Take 1 tablet (50 mg total) by mouth daily. 12/08/12  Yes Romero Belling, MD  Memantine HCl ER 28 MG CP24 Take 28 mg by mouth daily. 01/22/13  Yes Delia Heady, MD  metFORMIN (GLUCOPHAGE) 500 MG tablet Take 1 tablet (500 mg total) by mouth 2 (two) times daily with a meal. 02/13/12  Yes Carlus Pavlov, MD  polyethylene glycol (MIRALAX / GLYCOLAX) packet Take 17 g by mouth  daily.   Yes Historical Provider, MD  pregabalin (LYRICA) 50 MG capsule Take 50 mg by mouth at bedtime.   Yes Historical Provider, MD  QUEtiapine (SEROQUEL) 25 MG tablet Take 25 mg by mouth at bedtime.  12/29/12  Yes Historical Provider, MD  risperiDONE (RISPERDAL) 0.5 MG tablet Take 1 tablet (0.5 mg total) by mouth at bedtime. 12/23/12  Yes Belkys A [name], MD  sennosides-docusate sodium (SENOKOT-S) 8.6-50 MG tablet Take 1 tablet by mouth daily.   Yes Historical Provider, MD  simvastatin (ZOCOR) 20 MG tablet Take 20 mg by mouth daily. 05/03/13  Yes Historical Provider, MD  sucralfate (CARAFATE) 1 GM/10ML suspension Take 1 g by mouth 2 (two) times daily.   Yes Historical Provider, MD  traMADol-acetaminophen (ULTRACET) 37.5-325 MG per tablet Take 1 tablet by mouth every 6 (six) hours as needed.   Yes Historical Provider, MD  verapamil (VERELAN PM) 120 MG 24 hr capsule Take 120 mg by mouth daily.    Yes Historical Provider, MD  menthol-cetylpyridinium (CEPACOL REGULAR STRENGTH) 3 MG lozenge Take 1 lozenge (3 mg total) by mouth as needed for sore throat. 12/29/13   Oneal Grout, MD   No Known Allergies  FAMILY HISTORY:  indicated that her mother is deceased. She indicated that her father is deceased. She indicated that her sister is deceased.  SOCIAL HISTORY:  reports that she has never smoked. She has never used smokeless tobacco. She reports that she does not drink alcohol or use illicit drugs.  REVIEW OF SYSTEMS:  Unable.  Pt AMS on vent. HPI obtained from daughter and records.   SUBJECTIVE:   VITAL SIGNS: Temp:  [95.7 F (35.4 C)] 95.7 F (35.4 C) (10/26 0843) Pulse Rate:  [77-94] 79 (10/26 0905) Resp:  [18-22] 20 (10/26 0905) BP: (137-237)/(57-133) 145/61 mmHg (10/26 0905) SpO2:  [99 %-100 %] 100 % (10/26 0830) FiO2 (%):  [40 %-100 %] 40 % (10/26 0905) Weight:  [163 lb (73.936 kg)] 163 lb (73.936 kg) (10/26 0500) HEMODYNAMICS:   VENTILATOR SETTINGS: Vent Mode:  [-] PRVC FiO2  (%):  [40 %-100 %] 40 % Set Rate:  [14 bmp] 14 bmp Vt Set:  [500 mL] 500 mL PEEP:  [5 cmH20] 5 cmH20 Plateau Pressure:  [16 cmH20] 16 cmH20 INTAKE / OUTPUT:  Intake/Output Summary (Last 24 hours) at 01/21/14 0924 Last data filed at 21-Jan-2014 0905  Gross per 24 hour  Intake    280 ml  Output      0 ml  Net    280 ml    PHYSICAL EXAMINATION: General:  Chronically ill appearing female, NAD on vent  Neuro:  RASS -1 on low dose fent gtt, follows commands with R sided, pupils 3mm reactive, flaccid L side HEENT:  Mm dry, no JVD, ETT Cardiovascular:  s1s2 rrr Lungs:  resps even non labored on vent, diminished bases  Abdomen:  Round, soft, non tender, +bs Musculoskeletal:  Warm and dry, scant BLE edema   LABS:  CBC  Recent Labs Lab 01-21-14 0620 01-21-2014 0630  WBC 12.4*  --  HGB 11.7* 13.9  HCT 36.4 41.0  PLT 235  --    Coag's  Recent Labs Lab 12/27/2013 0620  APTT 21*  INR 1.02   BMET  Recent Labs Lab 12/31/2013 0620 12/26/2013 0630  NA 139 139  K 3.8 3.5*  CL 102 105  CO2 21  --   BUN 17 18  CREATININE 0.87 0.90  GLUCOSE 272* 279*   Electrolytes  Recent Labs Lab 01/01/2014 0620  CALCIUM 9.5   Sepsis Markers No results found for this basename: LATICACIDVEN, PROCALCITON, O2SATVEN,  in the last 168 hours ABG  Recent Labs Lab 12/30/2013 0727  PHART 7.382  PCO2ART 42.0  PO2ART 307.0*   Liver Enzymes  Recent Labs Lab 12/22/2013 0620  AST 22  ALT 13  ALKPHOS 71  BILITOT 0.5  ALBUMIN 3.8   Cardiac Enzymes No results found for this basename: TROPONINI, PROBNP,  in the last 168 hours Glucose No results found for this basename: GLUCAP,  in the last 168 hours  Imaging No results found.   ASSESSMENT / PLAN:  NEUROLOGIC ICH - large acute R intraparenchymal hemorrhage with mass effect, extension into ventricular system  P:   RASS goal: 0 MRI, MRA brain pending, consider dc this study, is this necessary? Carotid dopplers , is hemorraghic, can we  dc Neuro following closely  Hold all home meds for now - note she takes, Seroquel, Risperdal, clonopin at home  Cont low dose fent gtt for comfort  Frequent neuro checks  See discussion below  Control pain Daughter aware of declines which are immanent Daughter also agree no escalation consider empiric keppra - palliation  PULMONARY OETT  10/26>>> Acute respiratory failure - secondary to AMS in setting large ICH  P:   Cont vent support  abg reviewed, keep same MV No weaning Consider comfort in am  CARDIOVASCULAR HTN emergency P:  Cont cardene gtt  Goal SBP <160 Echo pending, can we dc this Hold home meds for now - verapamil, losartan   RENAL Hyponatremia  P:   F/u chem  Replete K prn  Avoid free water  GASTROINTESTINAL No active issue  P:   ppi   HEMATOLOGIC ICH P:  D/c home plavix  F/u coags  scd No hep  INFECTIOUS NO active s/s infection  P:   Monitor wbc, fever curve off abx  ENDOCRINE DM   P:   SSI   FAMILY  - Updates: daughter updated at length at bedside 10/26    TODAY'S SUMMARY:  80yo female with likely catastrophic ICH in setting HTN.  Pt with underlying mild dementia.  Discussed with daughter at bedside.  She needs some time to process but understands that prognosis is very poor and this may not be a survivable bleed.  She is not quite ready to discuss comfort care but does not want pt to suffer.  Will make DNR, no escalation of care, no pressors etc for now and depending on pts status over next 24 hours will discuss comfort.  Admit to neuro ICU for close monitoring.  Suspect that her fairly stable mental status at this moment is short lived and will deteriorate overnight.  Questions answered, pts granddaughter on her way to support pts daughter.   I have personally obtained a history, examined the patient, evaluated laboratory and imaging results, formulated the assessment and plan and placed orders. CRITICAL CARE: The patient is critically  ill with multiple organ systems failure and requires high complexity decision making for assessment and  support, frequent evaluation and titration of therapies, application of advanced monitoring technologies and extensive interpretation of multiple databases by me . Critical Care Time devoted to patient care services described in this note is 35 minutes is my time independent of the NP.   Dirk Dress, NP 12/30/2013  9:24 AM Pager: [phone] or [phone]  Mcarthur Rossetti. Tyson Alias, MD, FACP Pgr: [phone] [facility]

## 2014-01-11 NOTE — ED Notes (Signed)
Family spoke with CCM and DNR will be put in place for patient, family informed of plan of care

## 2014-01-11 NOTE — ED Notes (Signed)
Patient transported to CT 

## 2014-01-11 NOTE — ED Notes (Signed)
Warming measures provided to patient,

## 2014-01-11 NOTE — ED Provider Notes (Signed)
INTUBATION Performed by: Antony MaduraHUMES, Aizik Reh  Required items: required blood products, implants, devices, and special equipment available Patient identity confirmed: provided demographic data and hospital-assigned identification number Time out: Immediately prior to procedure a "time out" was called to verify the correct patient, procedure, equipment, support staff and site/side marked as required.  Indications: AMS  Intubation method: MAC 3 direct laryngoscopy  Preoxygenation: 100% BVM  Sedatives: 20mg  Etomidate Paralytic: 80mg  Rocuronium  Tube Size: 8-0 cuffed  Post-procedure assessment: chest rise and ETCO2 monitor Breath sounds: equal and absent over the epigastrium Tube secured with: ETT holder Chest x-ray interpreted by radiologist and me.  Chest x-ray findings: Endotracheal tube in appropriate position  Patient tolerated the procedure well with no immediate complications.     Antony MaduraKelly Marcos Peloso, [place] JerseyPA-C 12/18/2013 [phone]

## 2014-01-11 NOTE — ED Provider Notes (Signed)
CSN: [id number]     Arrival date & time 12/29/2013  4098 History   First MD Initiated Contact with Patient 12/20/2013 [phone]     Chief Complaint  Patient presents with  . Code Stroke     (Consider location/radiation/quality/duration/timing/severity/associated sxs/prior Treatment) The history is provided by the EMS personnel. No language interpreter was used.  [name] is an 80 y/o F with PMHx of memory loss, IBS, OA, HTN, GERD, DM, HCL presenting to the ED, BIBEMS, regarding stroke like symptoms. Patient last seen normal at 5:00AM this morning where staff in nursing home heard a thud - where patient had fallen in the bathroom. Shortly after the event patient was alert, complaining of headache and dizziness. Staff at the nursing home - [facility] - reported that patient suddenly became nonverbal and incontinent with stool. Increased altered mental status noted. As per EMS, reported that patient had rightward gaze and decreased ROM to the left side of the body - stated that she has sporadic motions to the right side of the body. ROS could not be performed secondary to acuity of condition and patient being intubated secondary to deep breathing.  PCP Dr. Loleta Chance   Past Medical History  Diagnosis Date  . Memory loss 06/13/2009  . Irritable bowel syndrome 06/13/2009  . MIGRAINE HEADACHE 06/13/2009  . OSTEOARTHRITIS 06/13/2009  . HYPERTENSION 06/13/2009  . GERD Sept 2012    [facility], pH and manometry.. hiatal hernia present, resting pressure of LES hypotensive. normal peristalsis esophageal contractions. DeMeester score 0.2. Excellent control of GERD on PPI.   Marland Kitchen HYPERCHOLESTEROLEMIA 06/13/2009  . DM 06/13/2009   Past Surgical History  Procedure Laterality Date  . Appendectomy      40+ years ago  . Esophagogastroduodenoscopy  03/30/2003     YNW:GNFAOZHY ring otherwise normal esophagus/  Small hiatal hernia/ Normal stomach/ Status post dilation of the ring in an unusual manner  .  Colonoscopy  03/30/2003    Poor prep precluded completion of the examination.  . Colonoscopy  June 2006    Dr. Elpidio Anis: normal  . 24 hour ph study  Sept 2012    DeMeester 0.2  . Esophageal manometry      hypotensive LES   Family History  Problem Relation Age of Onset  . Diabetes Father   . Diabetes Sister   . Colon cancer Brother     deceased at age  61, diagnosed age late 70s.    History  Substance Use Topics  . Smoking status: Never Smoker   . Smokeless tobacco: Never Used  . Alcohol Use: No   OB History   Grav Para Term Preterm Abortions TAB SAB Ect Mult Living                 Review of Systems  Unable to perform ROS: Intubated      Allergies  Review of patient's allergies indicates no known allergies.  Home Medications   Prior to Admission medications   Medication Sig Start Date End Date Taking? Authorizing Provider  acarbose (PRECOSE) 25 MG tablet Take 1 tablet (25 mg total) by mouth 3 (three) times daily with meals. 08/26/12  Yes Romero Belling, MD  acetaminophen (TYLENOL) 500 MG tablet Take 500 mg by mouth 2 (two) times daily.   Yes Historical Provider, MD  calcium carbonate (TUMS - DOSED IN MG ELEMENTAL CALCIUM) 500 MG chewable tablet Chew 1 tablet by mouth daily as needed for indigestion or heartburn.   Yes Historical  Provider, MD  Cholecalciferol (VITAMIN D) 2000 UNITS CAPS Take 2,000 Units by mouth daily.    Yes Historical Provider, MD  citalopram (CELEXA) 40 MG tablet Take 1 tablet (40 mg total) by mouth daily. 12/29/13  Yes Mahima Pandey, MD  clonazePAM (KLONOPIN) 0.5 MG tablet Take 0.25 mg by mouth 2 (two) times daily.   Yes Historical Provider, MD  clopidogrel (PLAVIX) 75 MG tablet Take 1 tablet (75 mg total) by mouth daily with breakfast. 12/23/12  Yes Belkys A [name], MD  esomeprazole (NEXIUM) 20 MG capsule Take 20 mg by mouth daily at 12 noon.   Yes Historical Provider, MD  insulin aspart (NOVOLOG) 100 UNIT/ML injection Inject 5 Units into the skin  3 (three) times daily before meals. If CBG >150   Yes Historical Provider, MD  linagliptin (TRADJENTA) 5 MG TABS tablet Take 1 tablet (5 mg total) by mouth daily. 10/31/12  Yes Romero Belling, MD  losartan (COZAAR) 50 MG tablet Take 1 tablet (50 mg total) by mouth daily. 12/08/12  Yes Romero Belling, MD  Memantine HCl ER 28 MG CP24 Take 28 mg by mouth daily. 01/22/13  Yes Delia Heady, MD  metFORMIN (GLUCOPHAGE) 500 MG tablet Take 1 tablet (500 mg total) by mouth 2 (two) times daily with a meal. 02/13/12  Yes Carlus Pavlov, MD  polyethylene glycol (MIRALAX / GLYCOLAX) packet Take 17 g by mouth daily.   Yes Historical Provider, MD  pregabalin (LYRICA) 50 MG capsule Take 50 mg by mouth at bedtime.   Yes Historical Provider, MD  QUEtiapine (SEROQUEL) 25 MG tablet Take 25 mg by mouth at bedtime.  12/29/12  Yes Historical Provider, MD  risperiDONE (RISPERDAL) 0.5 MG tablet Take 1 tablet (0.5 mg total) by mouth at bedtime. 12/23/12  Yes Belkys A [name], MD  sennosides-docusate sodium (SENOKOT-S) 8.6-50 MG tablet Take 1 tablet by mouth daily.   Yes Historical Provider, MD  simvastatin (ZOCOR) 20 MG tablet Take 20 mg by mouth daily. 05/03/13  Yes Historical Provider, MD  sucralfate (CARAFATE) 1 GM/10ML suspension Take 1 g by mouth 2 (two) times daily.   Yes Historical Provider, MD  traMADol-acetaminophen (ULTRACET) 37.5-325 MG per tablet Take 1 tablet by mouth every 6 (six) hours as needed.   Yes Historical Provider, MD  verapamil (VERELAN PM) 120 MG 24 hr capsule Take 120 mg by mouth daily.    Yes Historical Provider, MD  menthol-cetylpyridinium (CEPACOL REGULAR STRENGTH) 3 MG lozenge Take 1 lozenge (3 mg total) by mouth as needed for sore throat. 12/29/13   Mahima Glade Lloyd, MD   BP 158/62  Pulse 89  Temp(Src) 95.7 F (35.4 C) (Core (Comment))  Resp 20  Ht 5' 6.53" (1.69 m)  Wt 163 lb (73.936 kg)  BMI 25.89 kg/m2  SpO2 100% Physical Exam  Nursing note and vitals reviewed. Constitutional: She appears  well-developed and well-nourished. No distress.  HENT:  Head: Normocephalic and atraumatic.  Eyes: Conjunctivae and EOM are normal. Right eye exhibits no discharge. Left eye exhibits no discharge.  Decreased pupil reaction bilaterally   Neck: Normal range of motion. Neck supple.  Cardiovascular: Normal rate, regular rhythm and normal heart sounds.  Exam reveals no friction rub.   No murmur heard. Pulses:      Radial pulses are 2+ on the right side, and 2+ on the left side.       Dorsalis pedis pulses are 2+ on the right side, and 2+ on the left side.  Pulmonary/Chest: Breath sounds normal. No respiratory  distress. She has no wheezes. She has no rales.  Patient on ventilator  Neurological:  Mild motion noted to the right side of body - right hand Blank gaze note with right-ward deviation   Skin: Skin is warm and dry. No rash noted. She is not diaphoretic. No erythema.    ED Course  Procedures (including critical care time)  Results for orders placed during the hospital encounter of 06/28/2013  ETHANOL      Result Value Ref Range   Alcohol, Ethyl (B) <11  0 - 11 mg/dL  PROTIME-INR      Result Value Ref Range   Prothrombin Time 13.5  11.6 - 15.2 seconds   INR 1.02  0.00 - 1.49  APTT      Result Value Ref Range   aPTT 21 (*) 24 - 37 seconds  CBC      Result Value Ref Range   WBC 12.4 (*) 4.0 - 10.5 K/uL   RBC 4.90  3.87 - 5.11 MIL/uL   Hemoglobin 11.7 (*) 12.0 - 15.0 g/dL   HCT 19.136.4  47.836.0 - 29.546.0 %   MCV 74.3 (*) 78.0 - 100.0 fL   MCH 23.9 (*) 26.0 - 34.0 pg   MCHC 32.1  30.0 - 36.0 g/dL   RDW 62.118.0 (*) 30.811.5 - 65.715.5 %   Platelets 235  150 - 400 K/uL  DIFFERENTIAL      Result Value Ref Range   Neutrophils Relative % 49  43 - 77 %   Neutro Abs 6.0  1.7 - 7.7 K/uL   Lymphocytes Relative 42  12 - 46 %   Lymphs Abs 5.2 (*) 0.7 - 4.0 K/uL   Monocytes Relative 8  3 - 12 %   Monocytes Absolute 1.0  0.1 - 1.0 K/uL   Eosinophils Relative 1  0 - 5 %   Eosinophils Absolute 0.2  0.0 -  0.7 K/uL   Basophils Relative 0  0 - 1 %   Basophils Absolute 0.0  0.0 - 0.1 K/uL  COMPREHENSIVE METABOLIC PANEL      Result Value Ref Range   Sodium 139  137 - 147 mEq/L   Potassium 3.8  3.7 - 5.3 mEq/L   Chloride 102  96 - 112 mEq/L   CO2 21  19 - 32 mEq/L   Glucose, Bld 272 (*) 70 - 99 mg/dL   BUN 17  6 - 23 mg/dL   Creatinine, Ser 8.460.87  0.50 - 1.10 mg/dL   Calcium 9.5  8.4 - 96.210.5 mg/dL   Total Protein 7.8  6.0 - 8.3 g/dL   Albumin 3.8  3.5 - 5.2 g/dL   AST 22  0 - 37 U/L   ALT 13  0 - 35 U/L   Alkaline Phosphatase 71  39 - 117 U/L   Total Bilirubin 0.5  0.3 - 1.2 mg/dL   GFR calc non Af Amer 60 (*) >90 mL/min   GFR calc Af Amer 70 (*) >90 mL/min   Anion gap 16 (*) 5 - 15  URINE RAPID DRUG SCREEN (HOSP PERFORMED)      Result Value Ref Range   Opiates NONE DETECTED  NONE DETECTED   Cocaine NONE DETECTED  NONE DETECTED   Benzodiazepines NONE DETECTED  NONE DETECTED   Amphetamines NONE DETECTED  NONE DETECTED   Tetrahydrocannabinol NONE DETECTED  NONE DETECTED   Barbiturates NONE DETECTED  NONE DETECTED  URINALYSIS, ROUTINE W REFLEX MICROSCOPIC      Result Value  Ref Range   Color, Urine YELLOW  YELLOW   APPearance CLEAR  CLEAR   Specific Gravity, Urine 1.012  1.005 - 1.030   pH 7.5  5.0 - 8.0   Glucose, UA 500 (*) NEGATIVE mg/dL   Hgb urine dipstick TRACE (*) NEGATIVE   Bilirubin Urine NEGATIVE  NEGATIVE   Ketones, ur NEGATIVE  NEGATIVE mg/dL   Protein, ur 161 (*) NEGATIVE mg/dL   Urobilinogen, UA 0.2  0.0 - 1.0 mg/dL   Nitrite NEGATIVE  NEGATIVE   Leukocytes, UA NEGATIVE  NEGATIVE  URINE MICROSCOPIC-ADD ON      Result Value Ref Range   WBC, UA 0-2  <3 WBC/hpf   RBC / HPF 3-6  <3 RBC/hpf   Bacteria, UA RARE  RARE  I-STAT CHEM 8, ED      Result Value Ref Range   Sodium 139  137 - 147 mEq/L   Potassium 3.5 (*) 3.7 - 5.3 mEq/L   Chloride 105  96 - 112 mEq/L   BUN 18  6 - 23 mg/dL   Creatinine, Ser 0.96  0.50 - 1.10 mg/dL   Glucose, Bld 045 (*) 70 - 99 mg/dL    Calcium, Ion 4.09  8.11 - 1.30 mmol/L   TCO2 22  0 - 100 mmol/L   Hemoglobin 13.9  12.0 - 15.0 g/dL   HCT 91.4  78.2 - 95.6 %  I-STAT TROPOININ, ED      Result Value Ref Range   Troponin i, poc 0.00  0.00 - 0.08 ng/mL   Comment 3           I-STAT ARTERIAL BLOOD GAS, ED      Result Value Ref Range   pH, Arterial 7.382  7.350 - 7.450   pCO2 arterial 42.0  35.0 - 45.0 mmHg   pO2, Arterial 307.0 (*) 80.0 - 100.0 mmHg   Bicarbonate 25.0 (*) 20.0 - 24.0 mEq/L   TCO2 26  0 - 100 mmol/L   O2 Saturation 100.0     Patient temperature 98.6 F     Collection site RADIAL, ALLEN'S TEST ACCEPTABLE     Sample type ARTERIAL    TYPE AND SCREEN      Result Value Ref Range   ABO/RH(D) O POS     Antibody Screen NEG     Sample Expiration 01/19/14    PREPARE PLATELET PHERESIS      Result Value Ref Range   Unit Number [id number]     Blood Component Type PLTPHER LR1     Unit division 00     Status of Unit ISSUED     Transfusion Status OK TO TRANSFUSE      Labs Review Labs Reviewed  APTT - Abnormal; Notable for the following:    aPTT 21 (*)    All other components within normal limits  CBC - Abnormal; Notable for the following:    WBC 12.4 (*)    Hemoglobin 11.7 (*)    MCV 74.3 (*)    MCH 23.9 (*)    RDW 18.0 (*)    All other components within normal limits  DIFFERENTIAL - Abnormal; Notable for the following:    Lymphs Abs 5.2 (*)    All other components within normal limits  COMPREHENSIVE METABOLIC PANEL - Abnormal; Notable for the following:    Glucose, Bld 272 (*)    GFR calc non Af Amer 60 (*)    GFR calc Af Amer 70 (*)    Anion gap  16 (*)    All other components within normal limits  URINALYSIS, ROUTINE W REFLEX MICROSCOPIC - Abnormal; Notable for the following:    Glucose, UA 500 (*)    Hgb urine dipstick TRACE (*)    Protein, ur 100 (*)    All other components within normal limits  I-STAT CHEM 8, ED - Abnormal; Notable for the following:    Potassium 3.5 (*)    Glucose,  Bld 279 (*)    All other components within normal limits  I-STAT ARTERIAL BLOOD GAS, ED - Abnormal; Notable for the following:    pO2, Arterial 307.0 (*)    Bicarbonate 25.0 (*)    All other components within normal limits  ETHANOL  PROTIME-INR  URINE RAPID DRUG SCREEN (HOSP PERFORMED)  URINE MICROSCOPIC-ADD ON  BLOOD GAS, ARTERIAL  I-STAT TROPOININ, ED  I-STAT TROPOININ, ED  TYPE AND SCREEN  PREPARE PLATELET PHERESIS  ABO/RH    Imaging Review Ct Head Wo Contrast  01/04/2014   CLINICAL DATA:  Left-sided weakness and facial droop.  EXAM: CT HEAD WITHOUT CONTRAST  TECHNIQUE: Contiguous axial images were obtained from the base of the skull through the vertex without intravenous contrast.  COMPARISON:  12/17/2012  FINDINGS: The large parenchymal hematoma in the right posterior parietal region measuring approximately 8 x 7 x 5.6 cm (156 cubic cm). There is significant intraventricular hemorrhage also. There is mass effect and midline shift estimated at 7.5 mm. No subdural hemorrhage. The brainstem and cerebellum appear normal. No findings for it downward transtentorial herniation.  The bony structures are intact. No fracture or bone lesion. The paranasal sinuses are clear. There is a remote below in type fracture involving the right orbit. The globes are intact.  IMPRESSION: Large intraparenchymal hematoma involving the right posterior parietal lobe with associated SAH and IVH. This could be a hypertensive hemorrhage or possibly a hemorrhagic infarct. There is 7.5 mm of right to left shift.  These results were called by telephone at the time of interpretation on 01/10/2014 at 7:13 am to Dr. Roseanne RenoStewart, who verbally acknowledged these results.   Electronically Signed   By: Loralie ChampagneMark  Gallerani M.D.   On: 12/30/2013 07:14   Dg Chest Portable 1 View  01/09/2014   CLINICAL DATA:  Initial evaluation for intubation  EXAM: PORTABLE CHEST - 1 VIEW  COMPARISON:  Multiple prior studies most recent 12/17/2012   FINDINGS: Endotracheal to 3.2 centimeters above the carinal. NG tube crosses the gastroesophageal junction. Heart size normal. Mild vascular congestion. No consolidation or effusion.  IMPRESSION: Endotracheal tube as described above.   Electronically Signed   By: Esperanza Heiraymond  Rubner M.D.   On: 12/30/2013 07:43     EKG Interpretation None     7:02 AM Dr. Roseanne RenoStewart saw and assess patient. Recommended patient to be admitted to CCM. Does not believe patient to be a surgery candidate.   7:17 AM This provider spoke with Dr. Dutch QuintPoole, Neurosurgeon. Discussed case in great detail. Physician to assess scans and to call this provider back.   7:20 AM Dr. Dutch QuintPoole called this provider back - reviewed the imaging. Stated that there is nothing surgical for him to do. Reported that patient can be admitted to critical care and palliative care.   7:26 AM This provider spoke with Dr. Tyson AliasFeinstein from CCM - discussed case in great detail, imaging, status of patient. Dr. Tyson AliasFeinstein to come and assess patient.   8:51 AM Dr. Tyson AliasFeinstein spoke with this provider and attending physician, Dr. Ardeen JourdainM. Belfi -  reported that patient will be admitted to [facility] - stated that patient will be on strict palliative care. Stated that patient is DNR - reported that will most likely not make it through this hemorrhagic stroke.   MDM   Final diagnoses:  Encounter for intubation  Hemorrhagic cerebrovascular accident (CVA)    Medications  lidocaine (cardiac) 100 mg/87ml (XYLOCAINE) 20 MG/ML injection 2% (  Not Given 12/23/2013 0704)  succinylcholine (ANECTINE) 20 MG/ML injection (  Not Given 12/25/2013 0705)  nicardipine (CARDENE) 20mg  in 0.86% saline IV infusion (0.1 mg/ml) (5 mg/hr Intravenous Rate/Dose Change 01/16/2014 0742)  fentaNYL (SUBLIMAZE) 2,500 mcg in sodium chloride 0.9 % 250 mL (10 mcg/mL) infusion (100 mcg/hr Intravenous New Bag/Given 01/09/2014 0811)  etomidate (AMIDATE) injection 20 mg ( Intravenous Duplicate 12/31/2013 0705)  rocuronium  (ZEMURON) injection 73.9 mg ( Intravenous Duplicate 12/24/2013 0706)  etomidate (AMIDATE) injection (20 mg Intravenous Given 12/28/2013 0629)  rocuronium (ZEMURON) injection (100 mg Intravenous Given 12/30/2013 0629)  nicardipine (CARDENE) 20mg  in 0.86% saline IV infusion (0.1 mg/ml) (0 mg/hr Intravenous Stopped 12/21/2013 0703)  0.9 %  sodium chloride infusion (10 mL/hr Intravenous New Bag/Given 12/26/2013 0722)   Filed Vitals:   01/10/2014 0715 12/29/2013 0730 01/04/2014 0757 01/06/2014 0843  BP: 175/90 155/64 158/62   Pulse: 93 90 89   Temp:    95.7 F (35.4 C)  TempSrc:    Core (Comment)  Resp: 20 19 20    Height:      Weight:      SpO2: 100% 100% 100%    EKG noted sinus rhythm with a heart rate of 79 bpm with left anterior fascicular block with early transition of R-wave progression, prolonged QT interval noted. I-STAT troponin negative elevation. CBC noted mildly elevated WBC of 12.4. CMP noted glucose of 272, with mild elevated anion gap of 16.0 mEq/L. aPPT 21. INR within normal limits. Ethanol negative elevation. Urinalysis and urine drug screen unremarkable. ABG noted elevated O2 levels. CT head noted large intraparenchymal hematoma involving the right posterior parietal lobe with associated SAH and IVH with 7.5 mm of right to left shift. Chest x-ray noted endotracheal tube in proper placement. Upon arrival to the ED patient was not responsive and having difficulty breathing - colleague intubated patient, patient placed on ventilation. Patient with right ward gaze, left sided weakness - mild sporadic motions to the right side of the body. Patient not alert, does not respond to questions or commands. Patient placed on Cardene for elevated blood pressure. Patient given platelets secondary to head bleed and being on Plavix.  Neurology saw and assessed patient - recommended CCM admission. Neurosurgery consulted - reported this is a non-surgical candidate and for patient to be admitted to CCM. CCM to come  and admit patient. Patient DNR and placed on palliative care - admitted under the care of CCM.   Raymon Mutton, PA-C  [phone]

## 2014-01-11 NOTE — Plan of Care (Signed)
Problem: Acute Treatment Outcomes Goal: Prognosis discussed with family/patient as appropriate Outcome: Completed/Met Date Met:  12/30/2013 Per Dr. Titus Mould

## 2014-01-12 ENCOUNTER — Inpatient Hospital Stay (HOSPITAL_COMMUNITY): Payer: Medicare Other

## 2014-01-12 DIAGNOSIS — R410 Disorientation, unspecified: Secondary | ICD-10-CM

## 2014-01-12 DIAGNOSIS — I610 Nontraumatic intracerebral hemorrhage in hemisphere, subcortical: Secondary | ICD-10-CM

## 2014-01-12 DIAGNOSIS — E118 Type 2 diabetes mellitus with unspecified complications: Secondary | ICD-10-CM

## 2014-01-12 LAB — BASIC METABOLIC PANEL
ANION GAP: 16 — AB (ref 5–15)
BUN: 23 mg/dL (ref 6–23)
CALCIUM: 9.3 mg/dL (ref 8.4–10.5)
CO2: 21 mEq/L (ref 19–32)
CREATININE: 1.04 mg/dL (ref 0.50–1.10)
Chloride: 105 mEq/L (ref 96–112)
GFR calc Af Amer: 56 mL/min — ABNORMAL LOW (ref >90)
GFR, EST NON AFRICAN AMERICAN: 49 mL/min — AB (ref >90)
Glucose, Bld: 299 mg/dL — ABNORMAL HIGH (ref 70–99)
Potassium: 4.1 mEq/L (ref 3.7–5.3)
SODIUM: 142 meq/L (ref 137–147)

## 2014-01-12 LAB — GLUCOSE, CAPILLARY
GLUCOSE-CAPILLARY: 232 mg/dL — AB (ref 70–99)
GLUCOSE-CAPILLARY: 234 mg/dL — AB (ref 70–99)
GLUCOSE-CAPILLARY: 244 mg/dL — AB (ref 70–99)
GLUCOSE-CAPILLARY: 349 mg/dL — AB (ref 70–99)
Glucose-Capillary: 282 mg/dL — ABNORMAL HIGH (ref 70–99)
Glucose-Capillary: 211 mg/dL — ABNORMAL HIGH (ref 70–99)
Glucose-Capillary: 217 mg/dL — ABNORMAL HIGH (ref 70–99)

## 2014-01-12 LAB — CBC
HCT: 33.9 % — ABNORMAL LOW (ref 36.0–46.0)
Hemoglobin: 10.9 g/dL — ABNORMAL LOW (ref 12.0–15.0)
MCH: 24.1 pg — AB (ref 26.0–34.0)
MCHC: 32.2 g/dL (ref 30.0–36.0)
MCV: 75 fL — ABNORMAL LOW (ref 78.0–100.0)
PLATELETS: 320 10*3/uL (ref 150–400)
RBC: 4.52 MIL/uL (ref 3.87–5.11)
RDW: 18.2 % — AB (ref 11.5–15.5)
WBC: 16.2 10*3/uL — ABNORMAL HIGH (ref 4.0–10.5)

## 2014-01-12 LAB — PREPARE PLATELET PHERESIS: Unit division: 0

## 2014-01-12 MED ORDER — INSULIN GLARGINE 100 UNIT/ML ~~LOC~~ SOLN
20.0000 [IU] | Freq: Every day | SUBCUTANEOUS | Status: DC
  Administered 2014-01-12: 20 [IU] via SUBCUTANEOUS
  Filled 2014-01-12 (×2): qty 0.2

## 2014-01-12 NOTE — Progress Notes (Signed)
PULMONARY / CRITICAL CARE MEDICINE   Name: [name] MRN: [id number] DOB: 06/18/1928    ADMISSION DATE:  01/03/2014  REFERRING MD :  Roseanne RenoStewart   CHIEF COMPLAINT:  ICH  INITIAL PRESENTATION: 80 yo female with hx HTN, DM, previous stroke, mild dementia presented from [place].  Staff heard a "thud" and found pt altered, non-verbal and incontinent.  Mental status continued to deteriorate with R gaze, L sided weakness and required intubation on arrival to ER for airway protection.  BP was markedly elevted with SBP >230, improved with cardene gtt.  CT head showed large R hemispheric hemorrhage with midline shift.  PCCM called to admit.   STUDIES:  CT head 10/26>>>Large intraparenchymal hematoma involving the right posterior parietal lobe with associated SAH and IVH. This could be a hypertensive hemorrhage or possibly a hemorrhagic infarct. There is 7.5 mm of right to left shift  SIGNIFICANT EVENTS: 10/26>> admitted, large ICH, d/w daughter, DNR, no escalation  10/27- worsening neurostatus  SUBJECTIVE: no longer follows commands, neg 1.5 liters  VITAL SIGNS: Temp:  [95.7 F (35.4 C)-99.3 F (37.4 C)] 98.4 F (36.9 C) (10/27 0700) Pulse Rate:  [74-101] 77 (10/27 0700) Resp:  [14-26] 14 (10/27 0700) BP: (119-173)/(48-63) 128/49 mmHg (10/27 0700) SpO2:  [100 %] 100 % (10/27 0700) FiO2 (%):  [40 %] 40 % (10/27 0800) Weight:  [71.4 kg (157 lb 6.5 oz)] 71.4 kg (157 lb 6.5 oz) (10/26 1500) HEMODYNAMICS:   VENTILATOR SETTINGS: Vent Mode:  [-] PRVC FiO2 (%):  [40 %] 40 % Set Rate:  [14 bmp] 14 bmp Vt Set:  [500 mL] 500 mL PEEP:  [5 cmH20] 5 cmH20 Plateau Pressure:  [14 cmH20-15 cmH20] 15 cmH20 INTAKE / OUTPUT:  Intake/Output Summary (Last 24 hours) at 01/12/14 0840 Last data filed at 01/12/14 0800  Gross per 24 hour  Intake 1647.08 ml  Output   3045 ml  Net -1397.92 ml    PHYSICAL EXAMINATION: General:  Chronically ill appearing female, NAD on vent  Neuro:  RASS -3, reduced, no  sedation given, moves right side to pain HEENT:  Mm dry, no JVD, ETT Cardiovascular:  s1s2 rrr Lungs: CTA Abdomen:  Round, soft, non tender, +bs Musculoskeletal:  Warm and dry, scant BLE edema   LABS:  CBC  Recent Labs Lab 01/02/2014 0620 01/09/2014 0630 01/12/14 0247  WBC 12.4*  --  16.2*  HGB 11.7* 13.9 10.9*  HCT 36.4 41.0 33.9*  PLT 235  --  320   Coag's  Recent Labs Lab 12/27/2013 0620  APTT 21*  INR 1.02   BMET  Recent Labs Lab 01/03/2014 0620 01/10/2014 0630 12/21/2013 1700 01/12/14 0247  NA 139 139  --  142  K 3.8 3.5*  --  4.1  CL 102 105  --  105  CO2 21  --   --  21  BUN 17 18  --  23  CREATININE 0.87 0.90  --  1.04  GLUCOSE 272* 279* 461* 299*   Electrolytes  Recent Labs Lab 12/18/2013 0620 01/12/14 0247  CALCIUM 9.5 9.3   Sepsis Markers No results found for this basename: LATICACIDVEN, PROCALCITON, O2SATVEN,  in the last 168 hours ABG  Recent Labs Lab 12/25/2013 0727  PHART 7.382  PCO2ART 42.0  PO2ART 307.0*   Liver Enzymes  Recent Labs Lab 01/02/2014 0620  AST 22  ALT 13  ALKPHOS 71  BILITOT 0.5  ALBUMIN 3.8   Cardiac Enzymes No results found for this basename: TROPONINI, PROBNP,  in the last 168 hours Glucose  Recent Labs Lab 01/03/2014 1624 12/23/2013 1941 12/18/2013 1943 12/21/2013 2307 01/12/14 0407 01/12/14 0741  GLUCAP 424* 443* 408* 348* 282* 232*    Imaging Ct Head Wo Contrast  01/07/2014   CLINICAL DATA:  Left-sided weakness and facial droop.  EXAM: CT HEAD WITHOUT CONTRAST  TECHNIQUE: Contiguous axial images were obtained from the base of the skull through the vertex without intravenous contrast.  COMPARISON:  12/17/2012  FINDINGS: The large parenchymal hematoma in the right posterior parietal region measuring approximately 8 x 7 x 5.6 cm (156 cubic cm). There is significant intraventricular hemorrhage also. There is mass effect and midline shift estimated at 7.5 mm. No subdural hemorrhage. The brainstem and cerebellum appear  normal. No findings for it downward transtentorial herniation.  The bony structures are intact. No fracture or bone lesion. The paranasal sinuses are clear. There is a remote below in type fracture involving the right orbit. The globes are intact.  IMPRESSION: Large intraparenchymal hematoma involving the right posterior parietal lobe with associated SAH and IVH. This could be a hypertensive hemorrhage or possibly a hemorrhagic infarct. There is 7.5 mm of right to left shift.  These results were called by telephone at the time of interpretation on 01/13/2014 at 7:13 am to Dr. Roseanne RenoStewart, who verbally acknowledged these results.   Electronically Signed   By: Loralie ChampagneMark  Gallerani M.D.   On: 01/03/2014 07:14   Dg Chest Portable 1 View  01/01/2014   CLINICAL DATA:  Initial evaluation for intubation  EXAM: PORTABLE CHEST - 1 VIEW  COMPARISON:  Multiple prior studies most recent 12/17/2012  FINDINGS: Endotracheal to 3.2 centimeters above the carinal. NG tube crosses the gastroesophageal junction. Heart size normal. Mild vascular congestion. No consolidation or effusion.  IMPRESSION: Endotracheal tube as described above.   Electronically Signed   By: Esperanza Heiraymond  Rubner M.D.   On: 12/17/2013 07:43     ASSESSMENT / PLAN:  NEUROLOGIC ICH - large acute R intraparenchymal hemorrhage with mass effect, extension into ventricular system  P:   RASS goal: 0 consider dc further testing No escalation, prognosis horrific Family considering comfort care I instructed duaghter to have family members visit Avoiding sedation as able  PULMONARY OETT  10/26>>> Acute respiratory failure - secondary to AMS in setting large ICH  P:   Catastrophic bleed No abg needed Reluctant to sbt, high risk apnea, low volumes Will assess drive at bedside with RT  CARDIOVASCULAR HTN emergency P:  Cont cardene gtt Goal SBP <160 consider addition oral dilt to dc drip in am if comfort not chosen  RENAL Hyponatremia  improved P:   F/u  chem  Avoid free water  GASTROINTESTINAL No active issue  P:   ppi  Consider start TF in am if not extubated for comfort or not  brain dead  HEMATOLOGIC ICH P:  scd No hep  INFECTIOUS NO active s/s infection  P:   Monitor wbc, fever curve off abx  ENDOCRINE DM   hyperglycemia P:   SSI, moderate May need lantus if TFstarted Low threshold IV bolus insulin regular  FAMILY  - Updates: daughter updated at length at bedside 10/26   TODAY'S SUMMARY:  Further d/w family to consider comfort care, consider oral dilt, T f, may need lantus and Tf started  I have personally obtained a history, examined the patient, evaluated laboratory and imaging results, formulated the assessment and plan and placed orders. CRITICAL CARE: The patient is critically ill with multiple  organ systems failure and requires high complexity decision making for assessment and support, frequent evaluation and titration of therapies, application of advanced monitoring technologies and extensive interpretation of multiple databases. Critical Care Time devoted to patient care services described in this note is 30 minutes.     Mcarthur Rossetti. Tyson Alias, MD, FACP Pgr: [phone] [facility]

## 2014-01-12 NOTE — Progress Notes (Signed)
Nutrition Brief Note  Chart reviewed. Pt with large ICH. Pt made DNR with no escalation of care.  Family considering transitioning to comfort care.  No nutrition interventions warranted at this time.  Please re-consult as needed.   Kendell BaneHeather Etai Copado RD, LDN, CNSC [phone] Pager [phone] After Hours Pager

## 2014-01-12 NOTE — Progress Notes (Signed)
STROKE TEAM PROGRESS NOTE   HISTORY [name] is an 80 y.o. female history diabetes mellitus, hypertension and hyperlipidemia as well as dementia and previous stroke presenting with new onset gaze to the right, inability to speak and left hemiplegia. Onset was at 5:30 AM today 12/25/2013. Patient has been on Plavix 75 mg per day. On arrival in the emergency room she was intubated for airway protection. CT scan of the head showed large right hemispheric intraparenchymal hemorrhage with ventricular extension as well as midline shift. NIH stroke score was 21. Blood pressure was markedly elevated at 235/110. Patient was started on Cardene drip IV. Patient was not administered TPA secondary to ICH. She was admitted to the neuro ICU for further evaluation and treatment.   SUBJECTIVE (INTERVAL HISTORY) Her daughter is at the bedside.  Overall she feels her condition is gradually worsening. She is off sedation, but very lethargic, briefly open eye for voice and not following commands. Discussed with daughter regarding diagnosis, treatment options and prognosis. Daughter is waiting for families to come.   OBJECTIVE Temp:  [95.7 F (35.4 C)-99.3 F (37.4 C)] 98.4 F (36.9 C) (10/27 0700) Pulse Rate:  [74-101] 77 (10/27 0700) Cardiac Rhythm:  [-] Normal sinus rhythm (10/27 0800) Resp:  [14-26] 14 (10/27 0700) BP: (119-173)/(48-63) 128/49 mmHg (10/27 0700) SpO2:  [100 %] 100 % (10/27 0700) FiO2 (%):  [40 %] 40 % (10/27 0800) Weight:  [71.4 kg (157 lb 6.5 oz)] 71.4 kg (157 lb 6.5 oz) (10/26 1500)   Recent Labs Lab 01/16/2014 1941 12/30/2013 1943 12/29/2013 2307 01/12/14 0407 01/12/14 0741  GLUCAP 443* 408* 348* 282* 232*    Recent Labs Lab 01/13/2014 0620 12/19/2013 0630 12/21/2013 1700 01/12/14 0247  NA 139 139  --  142  K 3.8 3.5*  --  4.1  CL 102 105  --  105  CO2 21  --   --  21  GLUCOSE 272* 279* 461* 299*  BUN 17 18  --  23  CREATININE 0.87 0.90  --  1.04  CALCIUM 9.5  --   --  9.3     Recent Labs Lab 12/30/2013 0620  AST 22  ALT 13  ALKPHOS 71  BILITOT 0.5  PROT 7.8  ALBUMIN 3.8    Recent Labs Lab 12/19/2013 0620 12/19/2013 0630 01/12/14 0247  WBC 12.4*  --  16.2*  NEUTROABS 6.0  --   --   HGB 11.7* 13.9 10.9*  HCT 36.4 41.0 33.9*  MCV 74.3*  --  75.0*  PLT 235  --  320   No results found for this basename: CKTOTAL, CKMB, CKMBINDEX, TROPONINI,  in the last 168 hours  Recent Labs  01/02/2014 0620  LABPROT 13.5  INR 1.02    Recent Labs  01/12/2014 0745  COLORURINE YELLOW  LABSPEC 1.012  PHURINE 7.5  GLUCOSEU 500*  HGBUR TRACE*  BILIRUBINUR NEGATIVE  KETONESUR NEGATIVE  PROTEINUR 100*  UROBILINOGEN 0.2  NITRITE NEGATIVE  LEUKOCYTESUR NEGATIVE       Component Value Date/Time   CHOL 168 12/21/2012 0549   TRIG 70 12/21/2012 0549   HDL 55 12/21/2012 0549   CHOLHDL 3.1 12/21/2012 0549   VLDL 14 12/21/2012 0549   LDLCALC 99 12/21/2012 0549   Lab Results  Component Value Date   HGBA1C 7.6* 12/15/2013      Component Value Date/Time   LABOPIA NONE DETECTED 01/09/2014 0745   COCAINSCRNUR NONE DETECTED 01/01/2014 0745   LABBENZ NONE DETECTED 12/20/2013 0745  AMPHETMU NONE DETECTED 01/13/2014 0745   THCU NONE DETECTED 12/26/2013 0745   LABBARB NONE DETECTED 12/29/2013 0745     Recent Labs Lab 12/18/2013 0620  ETH <11   I have personally reviewed the radiological images below and agree with the radiology interpretations.  Ct Head Wo Contrast 12/20/2013    Large intraparenchymal hematoma involving the right posterior parietal lobe with associated SAH and IVH. This could be a hypertensive hemorrhage or possibly a hemorrhagic infarct. There is 7.5 mm of right to left shift.    Dg Chest Port 1 View 01/12/2014    Satisfactory endotracheal tube position.  Atelectasis versus consolidation LEFT lower lobe.    12/23/2013   Endotracheal tube as described above.     2D echo 11/2013 - Normal LV function; moderate basal septal hypertrophy; grade  1 diastolic dysfunction; mildly calcified aortic valve with mild AS and trace AI; mild MR; mildly elevated pulmonary pressures.   PHYSICAL EXAM  Temp:  [97 F (36.1 C)-99.3 F (37.4 C)] 99 F (37.2 C) (10/27 1200) Pulse Rate:  [73-101] 75 (10/27 1234) Resp:  [14-26] 15 (10/27 1234) BP: (128-173)/(46-61) 146/49 mmHg (10/27 1234) SpO2:  [100 %] 100 % (10/27 1234) FiO2 (%):  [40 %] 40 % (10/27 1234) Weight:  [157 lb 6.5 oz (71.4 kg)] 157 lb 6.5 oz (71.4 kg) (10/26 1500)  General - Well nourished, well developed, intubated, lethargic off sedation.  Ophthalmologic - not able to see through.  Cardiovascular - Regular rate and rhythm with no murmur.  Neuro - intubated on vent, open eyes briefly on voice stimulation, PERRL, eyes middle position, not following commands, LUE and LLE flaccid, RUE briefly against gravity due to lack of effort and RLE withdraw to pain. Reflex diminished and babinski positive bilaterally.   ASSESSMENT/PLAN [name] is a 80 y.o. female with history of diabetes mellitus, hypertension, hyperlipidemia and dementia presenting with new onset gaze to the right, inability to speak and left hemiplegia. CT showed large left ICH with IVH and SAH.   Stroke:  Non-dominant right IPH with IVH and SAH and transfalcine herniation in setting of malignant hypertension. CAA not able to ruled out.     2D Echo  Unremarkable in 11/2013  ICH score = 4 now which means 97% mortality within one month  SCDs for VTE prophylaxis  NPO   Bedrest  clopidogrel 75 mg orally every day prior to admission, now on no antithrombotics given ICH  DNR  Ongoing aggressive risk factor management  Discussed with daughter, she agree with no escalation of therapy but would like to make decision once other family arrives.  Cerebral edema - due to large right IPH with IVH and SAH - midline shift - pt mental status declining, which is expected - ICH score = 4, very poor prognosis, with  mortality rate 97% in one month - due to poor prognosis, discussed with daughter and agrees no escalation of current therapy  Acute respiratory failure  Intubated for airway protection s/p ICH  Vent per CCM  Hypertension, malignant  SBP > 230 on arrival  cardene drip started in ED  Home meds:   cozaar  Unstable  SBP goal < 160  Hyperlipidemia  Home meds:  zocor 20   No statin now due to ICH  Diabetes  HgbA1c 7.6 in Sept 2015, goal < 7.0  Uncontrolled, glu 424  On SSI  Added lantus 20 units  Other Stroke Risk Factors Advanced age Hx hemorrhagic stroke 2014  Migraines  Other Active Problems  Baseline dementia  Hospital day # 1  This patient is critically ill due to large ICH with IVH and SAH and at significant risk of neurological worsening, death form brain herniation and cerebral edema. This patient's care requires constant monitoring of vital signs, hemodynamics, respiratory and cardiac monitoring, review of multiple databases, neurological assessment, discussion with family, other specialists and medical decision making of high complexity. I spent 35 minutes of neurocritical care time in the care of this patient.  Marvel PlanJindong Gregorio Worley, MD PhD Stroke Neurology 01/12/2014 1:03 PM  To contact Stroke Continuity provider, please refer to [email or web address]. After hours, contact General Neurology

## 2014-01-12 NOTE — ED Provider Notes (Signed)
I primarily evaluated this patient immediately upon arrival and she as a code stroke.  She intermittently follows commands on the right side with no function on  The left.  She has a rightward gaze as well.  Speech is slurred.  PAtient continues to be altered and requires intubation prior to CT scan.  See intubation note.  Dr. Roseanne RenoStewart evaluated patient as well and she would be tpa candidate if no bleed, however CT reveals large bleed with shift.  She was placed on nicardipine gtt for goal SBP 140-160 (currently staying in the 220s), head of bed was elevated and the patient was given platelets (she is on plavix at home).  N.surg consulted by oncoming PA and they do not advise for surgical intervention.  Patient was admitted to the ICU for continued care.  CRITICAL CARE Performed by: Tomasita CrumbleNI,Kaylon Hitz   Total critical care time: 50min  Critical care time was exclusive of separately billable procedures and treating other patients.  Critical care was necessary to treat or prevent imminent or life-threatening deterioration.  Critical care was time spent personally by me on the following activities: development of treatment plan with patient and/or surrogate as well as nursing, discussions with consultants, evaluation of patient's response to treatment, examination of patient, obtaining history from patient or surrogate, ordering and performing treatments and interventions, ordering and review of laboratory studies, ordering and review of radiographic studies, pulse oximetry and re-evaluation of patient's condition.   Medical screening examination/treatment/procedure(s) were conducted as a shared visit with non-physician practitioner(s) and myself.  I personally evaluated the patient during the encounter.   EKG Interpretation   Date/Time:  Monday January 11 2014 09:42:28 EDT Ventricular Rate:  79 PR Interval:  152 QRS Duration: 92 QT Interval:  442 QTC Calculation: 507 R Axis:   -42 Text  Interpretation:  Sinus rhythm Left anterior fascicular block Abnormal  R-wave progression, early transition Prolonged QT interval Confirmed by  BELFI  MD, MELANIE ([id number]) on 01/13/2014 10:06:12 AM        Tomasita CrumbleAdeleke Anjolina Byrer, MD 01/12/14 1008

## 2014-01-12 NOTE — Clinical Documentation Improvement (Signed)
Possible Clinical Conditions? Cerebral Edema Brain Herniation Coma Other Condition Cannot Clinically Determine   Supporting Information:(As per notes) "CT head showed large R hemispheric hemorrhage with midline shift' "ICH - large acute R intraparenchymal hemorrhage with mass effect, extension into ventricular system"   Thank You, Nevin BloodgoodJoan B Kodee Ravert, RN, BSN, CCDS,Clinical Documentation Specialist:  [phone]  [phone]=[facility]- [facility] Information Management

## 2014-01-12 NOTE — ED Provider Notes (Signed)
I was present at the bedside for entire length of procedure and guided PA to intubation.  O2 sat was 92% at the lowest.  Successful on second attempt.  No complications.  Medical screening examination/treatment/procedure(s) were conducted as a shared visit with non-physician practitioner(s) and myself.  I personally evaluated the patient during the encounter.   EKG Interpretation   Date/Time:  Monday January 11 2014 09:42:28 EDT Ventricular Rate:  79 PR Interval:  152 QRS Duration: 92 QT Interval:  442 QTC Calculation: 507 R Axis:   -42 Text Interpretation:  Sinus rhythm Left anterior fascicular block Abnormal  R-wave progression, early transition Prolonged QT interval Confirmed by  BELFI  MD, MELANIE ([id number]) on June 26, 2013 10:06:12 AM        Tomasita CrumbleAdeleke Vandell Kun, MD 01/12/14 1003

## 2014-01-13 LAB — GLUCOSE, CAPILLARY
GLUCOSE-CAPILLARY: 238 mg/dL — AB (ref 70–99)
GLUCOSE-CAPILLARY: 212 mg/dL — AB (ref 70–99)

## 2014-01-13 MED ORDER — SODIUM CHLORIDE 0.9 % IV SOLN
10.0000 mg/h | INTRAVENOUS | Status: DC
  Administered 2014-01-13: 5 mg/h via INTRAVENOUS
  Administered 2014-01-13: 10 mg/h via INTRAVENOUS
  Filled 2014-01-13 (×4): qty 10

## 2014-01-13 MED ORDER — MORPHINE SULFATE 10 MG/ML IJ SOLN
10.0000 mg/h | INTRAVENOUS | Status: DC
  Administered 2014-01-13: 10 mg/h via INTRAVENOUS
  Administered 2014-01-13: 5 mg/h via INTRAVENOUS
  Filled 2014-01-13 (×2): qty 10

## 2014-01-13 MED ORDER — MORPHINE BOLUS VIA INFUSION
5.0000 mg | INTRAVENOUS | Status: DC | PRN
  Administered 2014-01-13: 5 mg via INTRAVENOUS
  Filled 2014-01-13: qty 20

## 2014-01-13 MED ORDER — DILTIAZEM 12 MG/ML ORAL SUSPENSION
60.0000 mg | Freq: Four times a day (QID) | ORAL | Status: DC
  Filled 2014-01-13 (×4): qty 6

## 2014-01-13 MED ORDER — MORPHINE SULFATE 10 MG/ML IJ SOLN
1.0000 mg/h | INTRAVENOUS | Status: DC
  Administered 2014-01-13: 10 mg/h via INTRAVENOUS
  Filled 2014-01-13: qty 10

## 2014-01-13 MED ORDER — SODIUM CHLORIDE 0.9 % IV SOLN
1.0000 mg/h | INTRAVENOUS | Status: DC
  Administered 2014-01-13: 11 mg/h via INTRAVENOUS
  Administered 2014-01-13: 10 mg/h via INTRAVENOUS
  Filled 2014-01-13: qty 10

## 2014-01-13 MED ORDER — ACETAMINOPHEN 650 MG RE SUPP: 650.0000 mg | RECTAL | Status: DC | PRN

## 2014-01-13 MED ORDER — MIDAZOLAM BOLUS VIA INFUSION
5.0000 mg | INTRAVENOUS | Status: DC | PRN
  Administered 2014-01-13: 5 mg via INTRAVENOUS
  Filled 2014-01-13: qty 20

## 2014-01-13 MED ORDER — ATROPINE SULFATE 1 % OP SOLN
4.0000 [drp] | OPHTHALMIC | Status: DC | PRN
  Filled 2014-01-13: qty 2

## 2014-01-13 NOTE — Progress Notes (Signed)
Chaplain responded to consult for end of life. Chaplain facilitated storytelling and offered prayer. Pt daughter and son-in-law at bedside and reported pt pastor and church members have been present. Page chaplain as needed.   01/13/14 1300  Clinical Encounter Type  Visited With Family  Visit Type Spiritual support;Patient actively dying  Referral From Physician  Recommendations Follow Up  Spiritual Encounters  Spiritual Needs Emotional;Grief support;Prayer  Stress Factors  Family Stress Factors Loss  Bali Lyn, Mayer MaskerCourtney F, Chaplain 01/13/2014 1:48 PM

## 2014-01-13 NOTE — Progress Notes (Signed)
Wasted 20cc of Fentanyl gtt with Verneda SkillElaine Thielen, RN.

## 2014-01-13 NOTE — Progress Notes (Signed)
01/13/2014- 1110-Resp care note- Pt suctioned and terminally extubated as per MD order.  Pt placed on 2lpm nasal cannula post extubation.  Sats 98%, rr20, hr110.  Vent pulled from room.  Rn at bedside for procedure.  s Dyshon Philbin rrt, rcp

## 2014-01-13 NOTE — Progress Notes (Signed)
PULMONARY / CRITICAL CARE MEDICINE   Name: [name] MRN: [id number] DOB: 04/23/1928    ADMISSION DATE:  27-Feb-2014  REFERRING MD :  Roseanne RenoStewart   CHIEF COMPLAINT:  ICH  INITIAL PRESENTATION: 80 yo female with hx HTN, DM, previous stroke, mild dementia presented from [place].  Staff heard a "thud" and found pt altered, non-verbal and incontinent.  Mental status continued to deteriorate with R gaze, L sided weakness and required intubation on arrival to ER for airway protection.  BP was markedly elevted with SBP >230, improved with cardene gtt.  CT head showed large R hemispheric hemorrhage with midline shift.  PCCM called to admit.   STUDIES:  CT head 10/26>>>Large intraparenchymal hematoma involving the right posterior parietal lobe with associated SAH and IVH. This could be a hypertensive hemorrhage or possibly a hemorrhagic infarct. There is 7.5 mm of right to left shift  SIGNIFICANT EVENTS: 10/26>> admitted, large ICH, d/w daughter, DNR, no escalation  10/27- worsening neurostatus  SUBJECTIVE: no changes in neurostatus  VITAL SIGNS: Temp:  [97.9 F (36.6 C)-99.9 F (37.7 C)] 99.9 F (37.7 C) (10/28 0700) Pulse Rate:  [65-114] 87 (10/28 0700) Resp:  [13-20] 15 (10/28 0700) BP: (135-174)/(42-69) 140/52 mmHg (10/28 0700) SpO2:  [100 %] 100 % (10/28 0700) FiO2 (%):  [40 %] 40 % (10/28 0401) Weight:  [72.3 kg (159 lb 6.3 oz)] 72.3 kg (159 lb 6.3 oz) (10/28 0448) HEMODYNAMICS:   VENTILATOR SETTINGS: Vent Mode:  [-] PRVC FiO2 (%):  [40 %] 40 % Set Rate:  [14 bmp] 14 bmp Vt Set:  [500 mL] 500 mL PEEP:  [5 cmH20] 5 cmH20 Plateau Pressure:  [14 cmH20-16 cmH20] 15 cmH20 INTAKE / OUTPUT:  Intake/Output Summary (Last 24 hours) at 01/13/14 0752 Last data filed at 01/13/14 0700  Gross per 24 hour  Intake 1180.75 ml  Output    792 ml  Net 388.75 ml    PHYSICAL EXAMINATION: General:  Chronically ill appearing female, NAD on vent  Neuro:  RASS -3, reduced, no sedation given, WD to  pain right HEENT:  Increase JVD, ETT Cardiovascular:  s1s2 rrr Lungs: CTA Abdomen:  Round, soft, non tender, +bs hypo Musculoskeletal:  No edema   LABS:  CBC  Recent Labs Lab 12-27-13 0620 12-27-13 0630 01/12/14 0247  WBC 12.4*  --  16.2*  HGB 11.7* 13.9 10.9*  HCT 36.4 41.0 33.9*  PLT 235  --  320   Coag's  Recent Labs Lab 12-27-13 0620  APTT 21*  INR 1.02   BMET  Recent Labs Lab 12-27-13 0620 12-27-13 0630 12-27-13 1700 01/12/14 0247  NA 139 139  --  142  K 3.8 3.5*  --  4.1  CL 102 105  --  105  CO2 21  --   --  21  BUN 17 18  --  23  CREATININE 0.87 0.90  --  1.04  GLUCOSE 272* 279* 461* 299*   Electrolytes  Recent Labs Lab 12-27-13 0620 01/12/14 0247  CALCIUM 9.5 9.3   Sepsis Markers No results found for this basename: LATICACIDVEN, PROCALCITON, O2SATVEN,  in the last 168 hours ABG  Recent Labs Lab 12-27-13 0727  PHART 7.382  PCO2ART 42.0  PO2ART 307.0*   Liver Enzymes  Recent Labs Lab 12-27-13 0620  AST 22  ALT 13  ALKPHOS 71  BILITOT 0.5  ALBUMIN 3.8   Cardiac Enzymes No results found for this basename: TROPONINI, PROBNP,  in the last 168 hours Glucose  Recent  Labs Lab 01/12/14 0741 01/12/14 1139 01/12/14 1547 01/12/14 2100 01/12/14 2314 01/13/14 0337  GLUCAP 232* 234* 211* 217* 244* 238*    Imaging Dg Chest Port 1 View  01/12/2014   CLINICAL DATA:  Respiratory failure, followup, personal history of hypertension, diabetes, TIA, irritable bowel syndrome  EXAM: PORTABLE CHEST - 1 VIEW  COMPARISON:  Portable exam 0613 hr compared to 01/04/2014  FINDINGS: Tip of endotracheal tube projects 4.5 cm above carina.  Nasogastric tube extends into stomach.  Normal heart size, mediastinal contours and pulmonary vascularity for technique.  Atherosclerotic calcification aorta.  Atelectasis versus consolidation in LEFT lower lobe.  Remaining lungs clear.  No pleural effusion or pneumothorax.  Bones unremarkable.  IMPRESSION:  Satisfactory endotracheal tube position.  Atelectasis versus consolidation LEFT lower lobe.   Electronically Signed   By: Ulyses SouthwardMark  Boles M.D.   On: 01/12/2014 08:03     ASSESSMENT / PLAN:  NEUROLOGIC ICH - large acute R intraparenchymal hemorrhage with mass effect, extension into ventricular system  Horrible prognosis P:   RASS goal: 0 Neuro note reviewed, 97% mort in 30 days unlikely to have any fxnal recovery  PULMONARY OETT  10/26>>> Acute respiratory failure - secondary to AMS in setting large ICH  Poor airway protection P:   will assess at bedside with RT and RN cpap 5 ps 5 x 30 sec Will try to have prediction for morphine needs for meeting with daughter this am  No new pcxr  Have stressed airway protection concerns to duaghetr if extubation done  CARDIOVASCULAR HTN emergency P:  Cont cardene gtt, goal to dc this Goal SBP <160, consider lower goals consider addition oral dilt then dc nicardipine  RENAL Hyponatremia  improved P:   Even balance No free water  GASTROINTESTINAL No active issue  P:   ppi  If not extubated start TF  HEMATOLOGIC ICH, high risk DVT P:  scd  INFECTIOUS NO active s/s infection  P:   Monitor wbc, fever curve off abx  ENDOCRINE DM   Hyperglycemia  P:   SSI, moderate No role drip lantus if Tf started  FAMILY  - Updates: daughter updated at length at bedside 10/26, 27th  TODAY'S SUMMARY: will assess vent weaning and comfort needs with daughter, start TF and lantus if not extubated  Continued vent support and aggressive care is futile  I have personally obtained a history, examined the patient, evaluated laboratory and imaging results, formulated the assessment and plan and placed orders. CRITICAL CARE: The patient is critically ill with multiple organ systems failure and requires high complexity decision making for assessment and support, frequent evaluation and titration of therapies, application of advanced monitoring  technologies and extensive interpretation of multiple databases. Critical Care Time devoted to patient care services described in this note is 30 minutes.   Mcarthur Rossettianiel J. Tyson AliasFeinstein, MD, FACP Pgr: [phone] [facility]

## 2014-01-13 NOTE — Progress Notes (Signed)
STROKE TEAM PROGRESS NOTE   HISTORY [name] is an 80 y.o. female history diabetes mellitus, hypertension and hyperlipidemia as well as dementia and previous stroke presenting with new onset gaze to the right, inability to speak and left hemiplegia. Onset was at 5:30 AM today 01/08/2014. Patient has been on Plavix 75 mg per day. On arrival in the emergency room she was intubated for airway protection. CT scan of the head showed large right hemispheric intraparenchymal hemorrhage with ventricular extension as well as midline shift. NIH stroke score was 21. Blood pressure was markedly elevated at 235/110. Patient was started on Cardene drip IV. Patient was not administered TPA secondary to ICH. She was admitted to the neuro ICU for further evaluation and treatment.   SUBJECTIVE (INTERVAL HISTORY) No family is at the bedside.  Overall she feels her condition is gradually worsening. She is off sedation, but very lethargic, briefly open eye for voice and not following commands. CCM is talking with family regarding comfort care measures.   OBJECTIVE Temp:  [97.9 F (36.6 C)-100.4 F (38 C)] 100.4 F (38 C) (10/28 1100) Pulse Rate:  [65-114] 97 (10/28 1100) Cardiac Rhythm:  [-] Normal sinus rhythm (10/28 0800) Resp:  [13-25] 21 (10/28 1100) BP: (136-174)/(42-69) 159/55 mmHg (10/28 1100) SpO2:  [99 %-100 %] 100 % (10/28 1100) FiO2 (%):  [40 %] 40 % (10/28 0854) Weight:  [159 lb 6.3 oz (72.3 kg)] 159 lb 6.3 oz (72.3 kg) (10/28 0448)   Recent Labs Lab 01/12/14 1547 01/12/14 2100 01/12/14 2314 01/13/14 0337 01/13/14 0729  GLUCAP 211* 217* 244* 238* 212*    Recent Labs Lab 12/17/2013 0620 12/19/2013 0630 12/26/2013 1700 01/12/14 0247  NA 139 139  --  142  K 3.8 3.5*  --  4.1  CL 102 105  --  105  CO2 21  --   --  21  GLUCOSE 272* 279* 461* 299*  BUN 17 18  --  23  CREATININE 0.87 0.90  --  1.04  CALCIUM 9.5  --   --  9.3    Recent Labs Lab 12/29/2013 0620  AST 22  ALT 13   ALKPHOS 71  BILITOT 0.5  PROT 7.8  ALBUMIN 3.8    Recent Labs Lab 01/04/2014 0620 01/05/2014 0630 01/12/14 0247  WBC 12.4*  --  16.2*  NEUTROABS 6.0  --   --   HGB 11.7* 13.9 10.9*  HCT 36.4 41.0 33.9*  MCV 74.3*  --  75.0*  PLT 235  --  320   No results found for this basename: CKTOTAL, CKMB, CKMBINDEX, TROPONINI,  in the last 168 hours  Recent Labs  01/13/2014 0620  LABPROT 13.5  INR 1.02    Recent Labs  12/22/2013 0745  COLORURINE YELLOW  LABSPEC 1.012  PHURINE 7.5  GLUCOSEU 500*  HGBUR TRACE*  BILIRUBINUR NEGATIVE  KETONESUR NEGATIVE  PROTEINUR 100*  UROBILINOGEN 0.2  NITRITE NEGATIVE  LEUKOCYTESUR NEGATIVE       Component Value Date/Time   CHOL 168 12/21/2012 0549   TRIG 70 12/21/2012 0549   HDL 55 12/21/2012 0549   CHOLHDL 3.1 12/21/2012 0549   VLDL 14 12/21/2012 0549   LDLCALC 99 12/21/2012 0549   Lab Results  Component Value Date   HGBA1C 7.6* 12/15/2013      Component Value Date/Time   LABOPIA NONE DETECTED 12/30/2013 0745   COCAINSCRNUR NONE DETECTED 01/06/2014 0745   LABBENZ NONE DETECTED 12/28/2013 0745   AMPHETMU NONE DETECTED 12/24/2013 0745  THCU NONE DETECTED Jul 14, 2013 0745   LABBARB NONE DETECTED Jul 14, 2013 0745     Recent Labs Lab 2013/08/27 0620  ETH <11   I have personally reviewed the radiological images below and agree with the radiology interpretations.  Ct Head Wo Contrast Jul 14, 2013    Large intraparenchymal hematoma involving the right posterior parietal lobe with associated SAH and IVH. This could be a hypertensive hemorrhage or possibly a hemorrhagic infarct. There is 7.5 mm of right to left shift.    Dg Chest Port 1 View 01/12/2014    Satisfactory endotracheal tube position.  Atelectasis versus consolidation LEFT lower lobe.    Jul 14, 2013   Endotracheal tube as described above.     2D echo 11/2013 - Normal LV function; moderate basal septal hypertrophy; grade 1 diastolic dysfunction; mildly calcified aortic valve with mild  AS and trace AI; mild MR; mildly elevated pulmonary pressures.   PHYSICAL EXAM  Temp:  [97.9 F (36.6 C)-100.4 F (38 C)] 100.4 F (38 C) (10/28 1100) Pulse Rate:  [65-114] 97 (10/28 1100) Resp:  [13-25] 21 (10/28 1100) BP: (136-174)/(42-69) 159/55 mmHg (10/28 1100) SpO2:  [99 %-100 %] 100 % (10/28 1100) FiO2 (%):  [40 %] 40 % (10/28 0854) Weight:  [159 lb 6.3 oz (72.3 kg)] 159 lb 6.3 oz (72.3 kg) (10/28 0448)  General - Well nourished, well developed, intubated, lethargic off sedation.  Ophthalmologic - not able to see through.  Cardiovascular - Regular rate and rhythm with no murmur.  Neuro - intubated on vent, open eyes briefly on voice stimulation, PERRL, eyes middle position, not following commands, LUE and LLE flaccid, RUE briefly against gravity due to lack of effort and RLE withdraw to pain. Reflex diminished and babinski positive bilaterally.   ASSESSMENT/PLAN Ms. Courtney AluDora A Eagon is a 80 y.o. female with history of diabetes mellitus, hypertension, hyperlipidemia and dementia presenting with new onset gaze to the right, inability to speak and left hemiplegia. CT showed large left ICH with IVH and SAH.   Stroke:  Non-dominant right IPH with IVH and SAH and transfalcine herniation in setting of malignant hypertension. CAA not able to ruled out.     2D Echo  Unremarkable in 11/2013  ICH score = 4 now which means 97% mortality within one month  SCDs for VTE prophylaxis  NPO   Bedrest  clopidogrel 75 mg orally every day prior to admission, now on no antithrombotics given ICH  DNR  Ongoing aggressive risk factor management  CCM discussed with daughter and other family and they requested comfort care to keep pt comfort.  Cerebral edema - due to large right IPH with IVH and SAH - midline shift - pt mental status declining, which is expected - ICH score = 4, very poor prognosis, with mortality rate 97% in one month - due to poor prognosis, family requested comfort  care.   Acute respiratory failure  Intubated for airway protection s/p ICH  Vent per CCM  As family requested comfort care, extubation is expected.  Hypertension, malignant  SBP > 230 on arrival  cardene drip started in ED  Home meds:   cozaar  Will place orders for comfort care  Hyperlipidemia  Home meds:  zocor 20   No statin now due to ICH  Diabetes  HgbA1c 7.6 in Sept 2015, goal < 7.0  Uncontrolled, glu 424  On SSI  Will follow comfort care measures  Other Stroke Risk Factors Advanced age Hx hemorrhagic stroke 2014   Migraines  Other Active  Problems  Baseline dementia  Hospital day # 2  Neurology will sign off. Please call with questions. Thanks for the consult.  Marvel Plan, MD PhD Stroke Neurology 01/13/2014 1:54 PM  To contact Stroke Continuity provider, please refer to [email or web address]. After hours, contact General Neurology

## 2014-01-13 NOTE — Progress Notes (Signed)
Patient ID: [name], female   DOB: 11/06/1928, 80 y.o.   MRN: [id number] Extensive discussion with family daughter. We discussed the poor prognosis and likely poor quality of life. Family has decided to offer full comfort care. They are aware that the patient may be transferred to palliative care floor for continued comfort care needs. They have been fully updated on the process and expectations.  Courtney Rossettianiel J. Tyson AliasFeinstein, MD, FACP Pgr: [phone]  Pulmonary & Critical Care  Courtney MohKaty [name] present

## 2014-01-13 NOTE — Progress Notes (Signed)
Patient terminally extubated and placed on 2L [place] for comfort.  Pt suctioned.  Vent removed from room.  RN went to get daughter and daughter at bedside at this time.  Will continue to provide support to family.

## 2014-01-17 NOTE — Progress Notes (Signed)
Pt passed 01/03/2014 00:11 Family at bedside CDS was notified of cardiac time of death, not suitable for donation Medical Examiner ruled pt was not a ME case.

## 2014-01-17 NOTE — Progress Notes (Signed)
Wasted in sink with Lowella PettiesK. James, RN as witness 25 ml Versed IV infusion 60 ml Morphine IV infusion

## 2014-01-17 DEATH — deceased

## 2014-01-21 NOTE — Discharge Summary (Signed)
[name], [name]                 ACCOUNT NO.:  [id number]  MEDICAL RECORD NO.:  [id number]  LOCATION:  [id number]                        FACILITY:  [facility]  PHYSICIAN:  Nelda Bucksaniel J Feinstein, MD DATE OF BIRTH:  09-15-1928  DATE OF ADMISSION:  12/22/2013 DATE OF DISCHARGE:  01/01/2014                              DISCHARGE SUMMARY   DEATH SUMMARY  This is an 80 year old female with history of hypertension, diabetes, previous stroke with mild dementia, presented with essentially altered with nonverbal and incontinent mental status, continued to deteriorate with right-sided gaze, left-sided weakness and requiring intubation on arrival.  The patient had hypertension significantly with systolics greater than 230.  CT scan of the head showed large right hemispheric stroke, which is in the midline shift and likely not survival with no surgical intervention, recommended by neurosurgical consultation. Discussions with daughter were had, the patient made DNR.  She continued to decline neurologically with a very, very poor prognosis with Neurology as well indicating a poor prognosis.  Comfort care was established and initiate, the patient expired.  FINAL DIAGNOSES UPON DEATH: 1. Large acute right intraparenchymal hemorrhage with mass effect     likely secondary to hypertension. 2. Hypertensive emergency. 3. Acute respiratory failure. 4. Early mild dementia at baseline.     Nelda Bucksaniel J Feinstein, MD     DJF/MEDQ  D:  01/21/2014  T:  01/21/2014  Job:  [id number]

## 2014-03-09 ENCOUNTER — Other Ambulatory Visit: Payer: Medicare Other

## 2014-03-16 ENCOUNTER — Encounter: Payer: Medicare Other | Admitting: Internal Medicine

## 2014-03-17 NOTE — Telephone Encounter (Signed)
error 

## 2015-05-05 IMAGING — CT CT HEAD W/O CM
1 series · 16 of 30 positions shown, 20 images · non-contrast
Comparison: 04/03/2012

CLINICAL DATA: CVA

CT HEAD WITHOUT CONTRAST
TECHNIQUE: Contiguous axial images were obtained from the base of
the skull through the vertex without contrast.

[Series 2: head 5.0 h30s · axial · 0.42mm/px · z∈[+1010,+1155]mm · 16 of 33 slices shown, 20 images]
[im 2/33  brain]
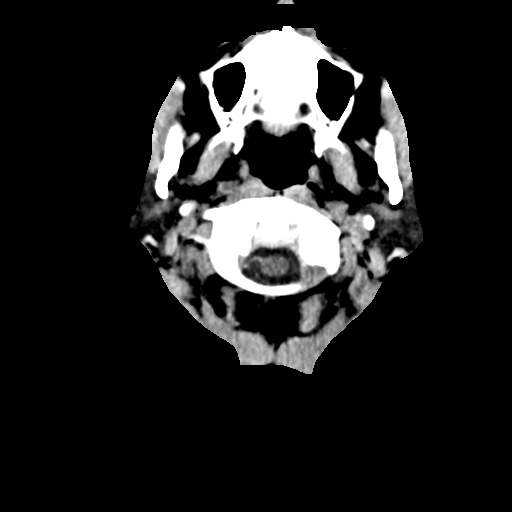
[im 2/33  bone]
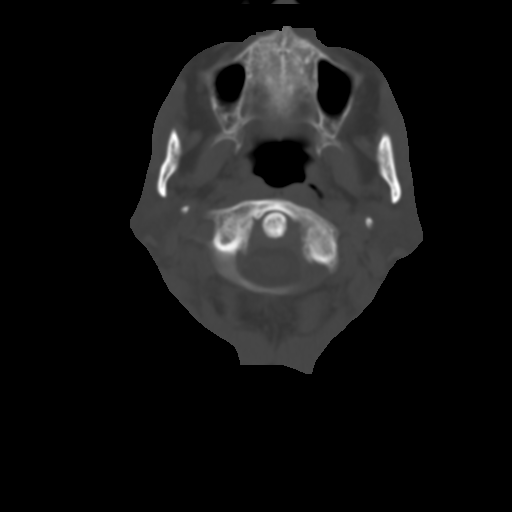
[im 4/33  brain]
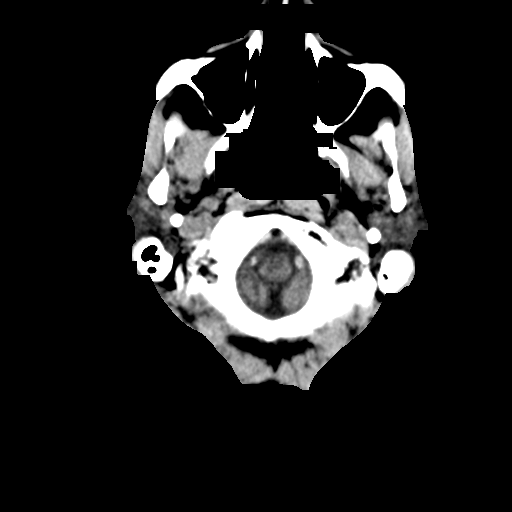
[im 6/33  brain]
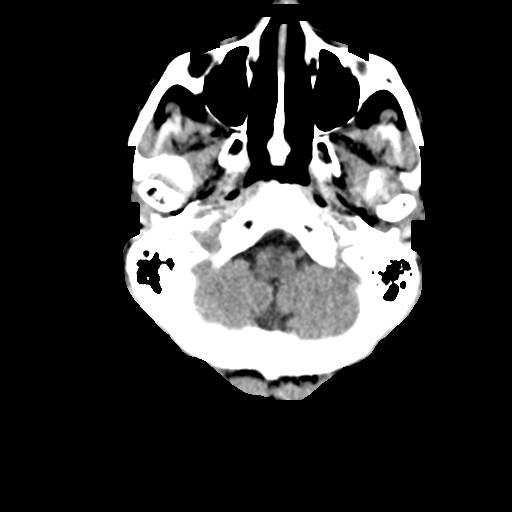
[im 8/33  brain]
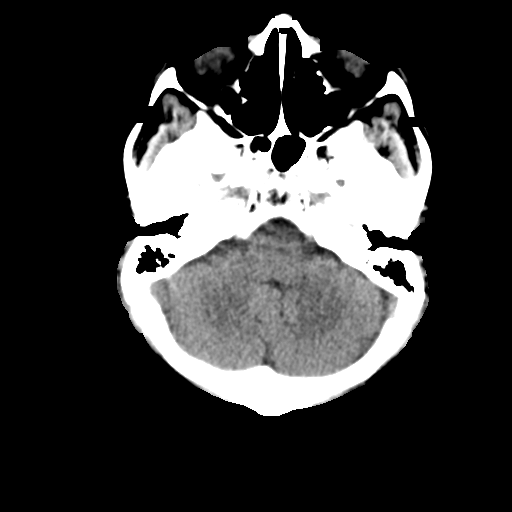
[im 9/33  brain]
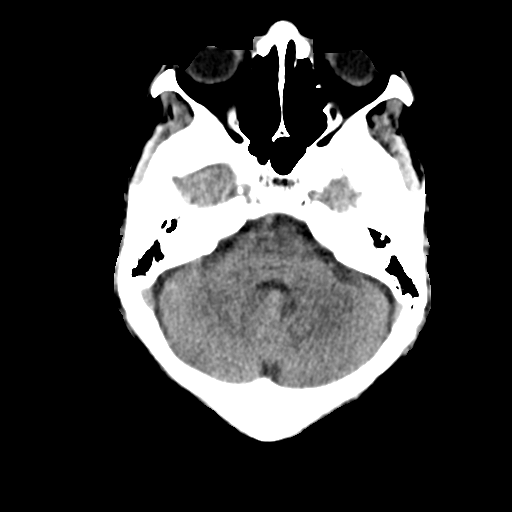
[im 9/33  bone]
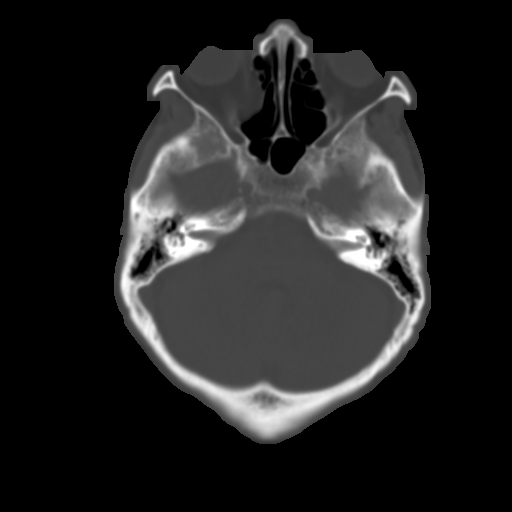
[im 12/33  brain]
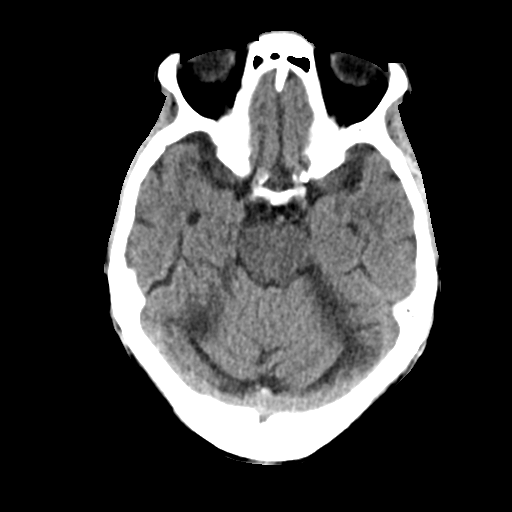
[im 14/33  brain]
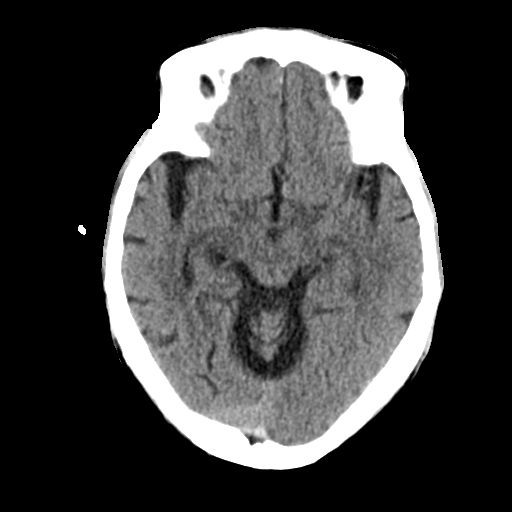
[im 16/33  brain]
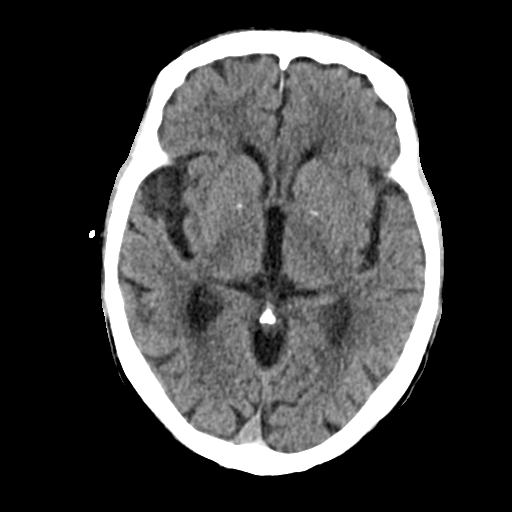
[im 17/33  brain]
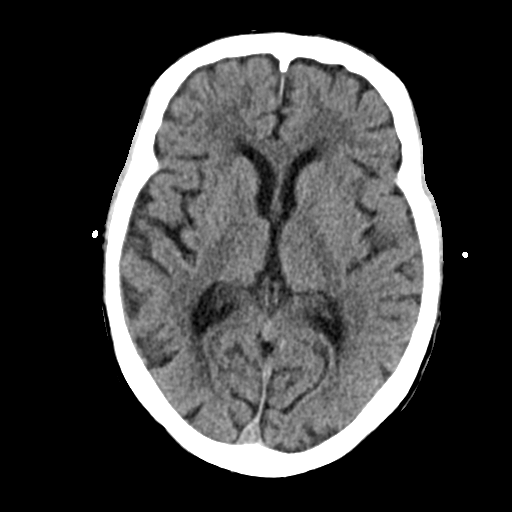
[im 17/33  bone]
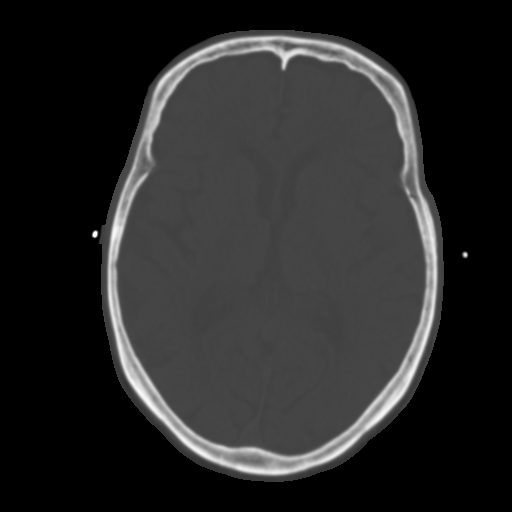
[im 19/33  brain]
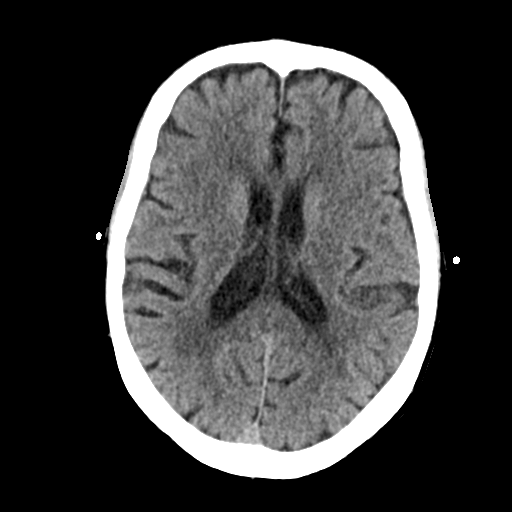
[im 21/33  brain]
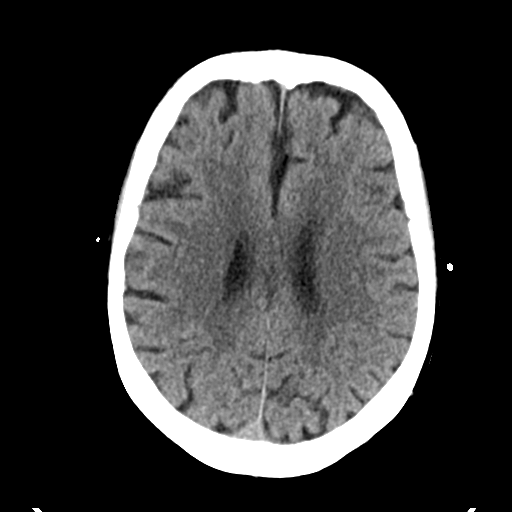
[im 24/33  brain]
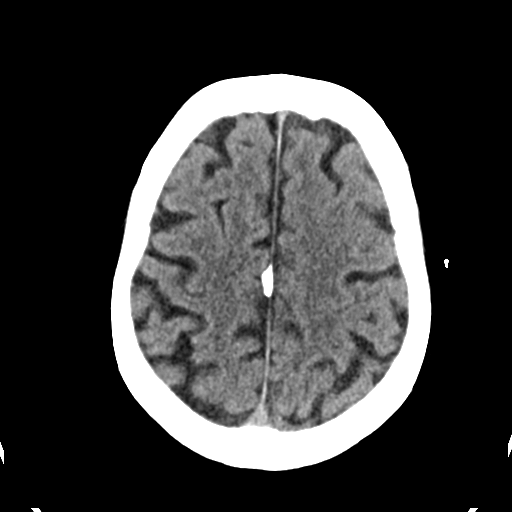
[im 25/33  brain]
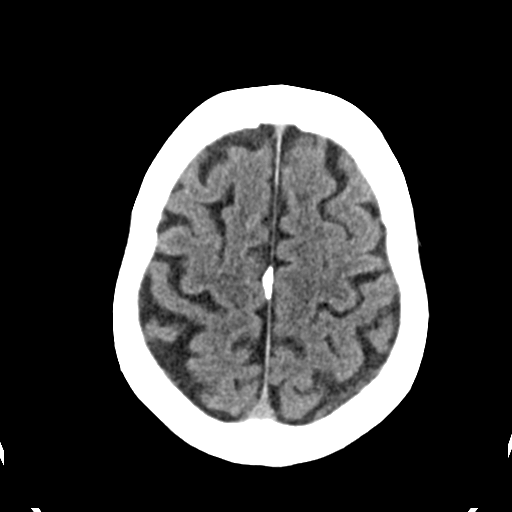
[im 25/33  bone]
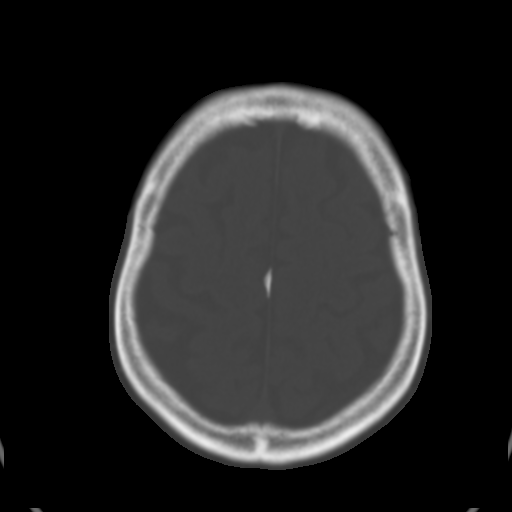
[im 27/33  brain]
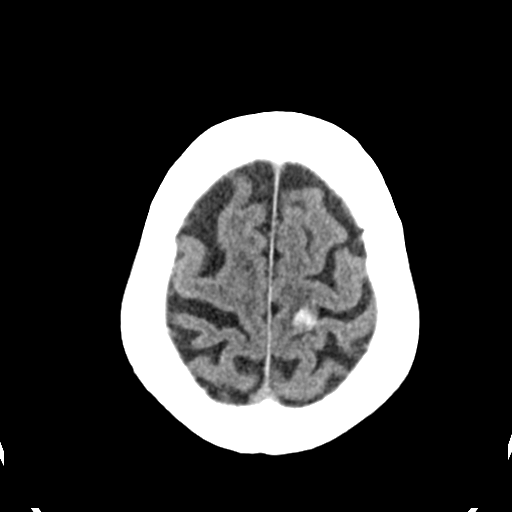
[im 29/33  brain]
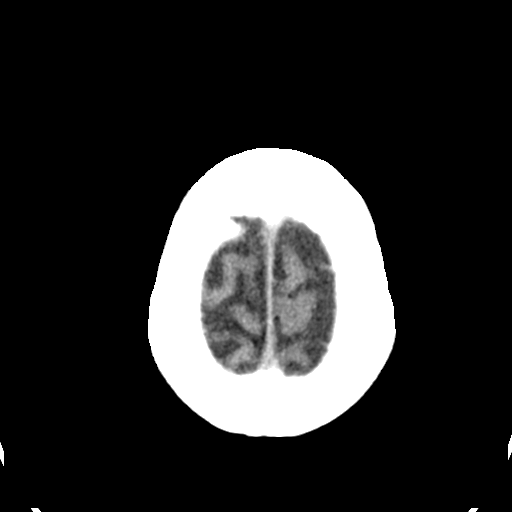
[im 31/33  brain]
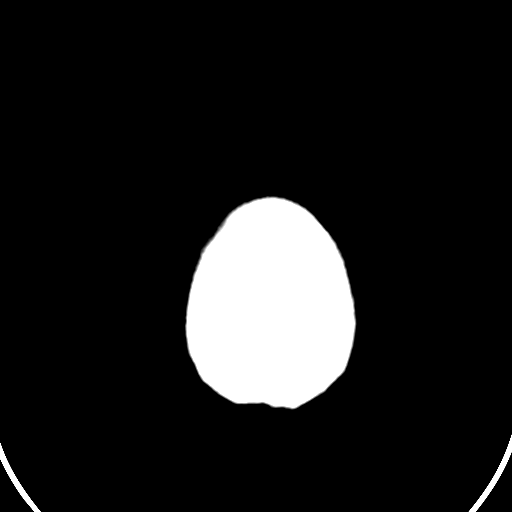

[16 of 30 positions shown; findings below may reference images not displayed]

FINDINGS: No skull fracture is noted.  Axial image 28 there is a
focal hemorrhage with tiny amount of surrounding edema in the left
parietal lobe high convexity measures 1.3 cm.

No intraventricular hemorrhage.  No mass effect or midline shift.
Mild cerebral atrophy.  Periventricular white matter decreased
attenuation probable due to chronic small vessel ischemic changes.
IMPRESSION: 1.  There is focal intraparenchymal hemorrhage with small amount of
surrounding edema in the left parietal lobe high convexity measures
1.3 cm.  Hemorrhagic infarct cannot be excluded.  Clinical
correlation is necessary.  Further evaluation with MRI is
recommended.
2.  Mild cerebral atrophy.  Periventricular white matter decreased
attenuation is probable due to chronic small vessel ischemic
changes.

Critical findings discussed with Dr. Montejano

## 2015-05-06 IMAGING — CR DG CHEST 2V
2 series · 2 of 2 positions shown · non-contrast
Comparison: 01/10/2006

CLINICAL DATA: Weakness, weight loss, stroke

CHEST - 2 VIEW

[w chest pa]
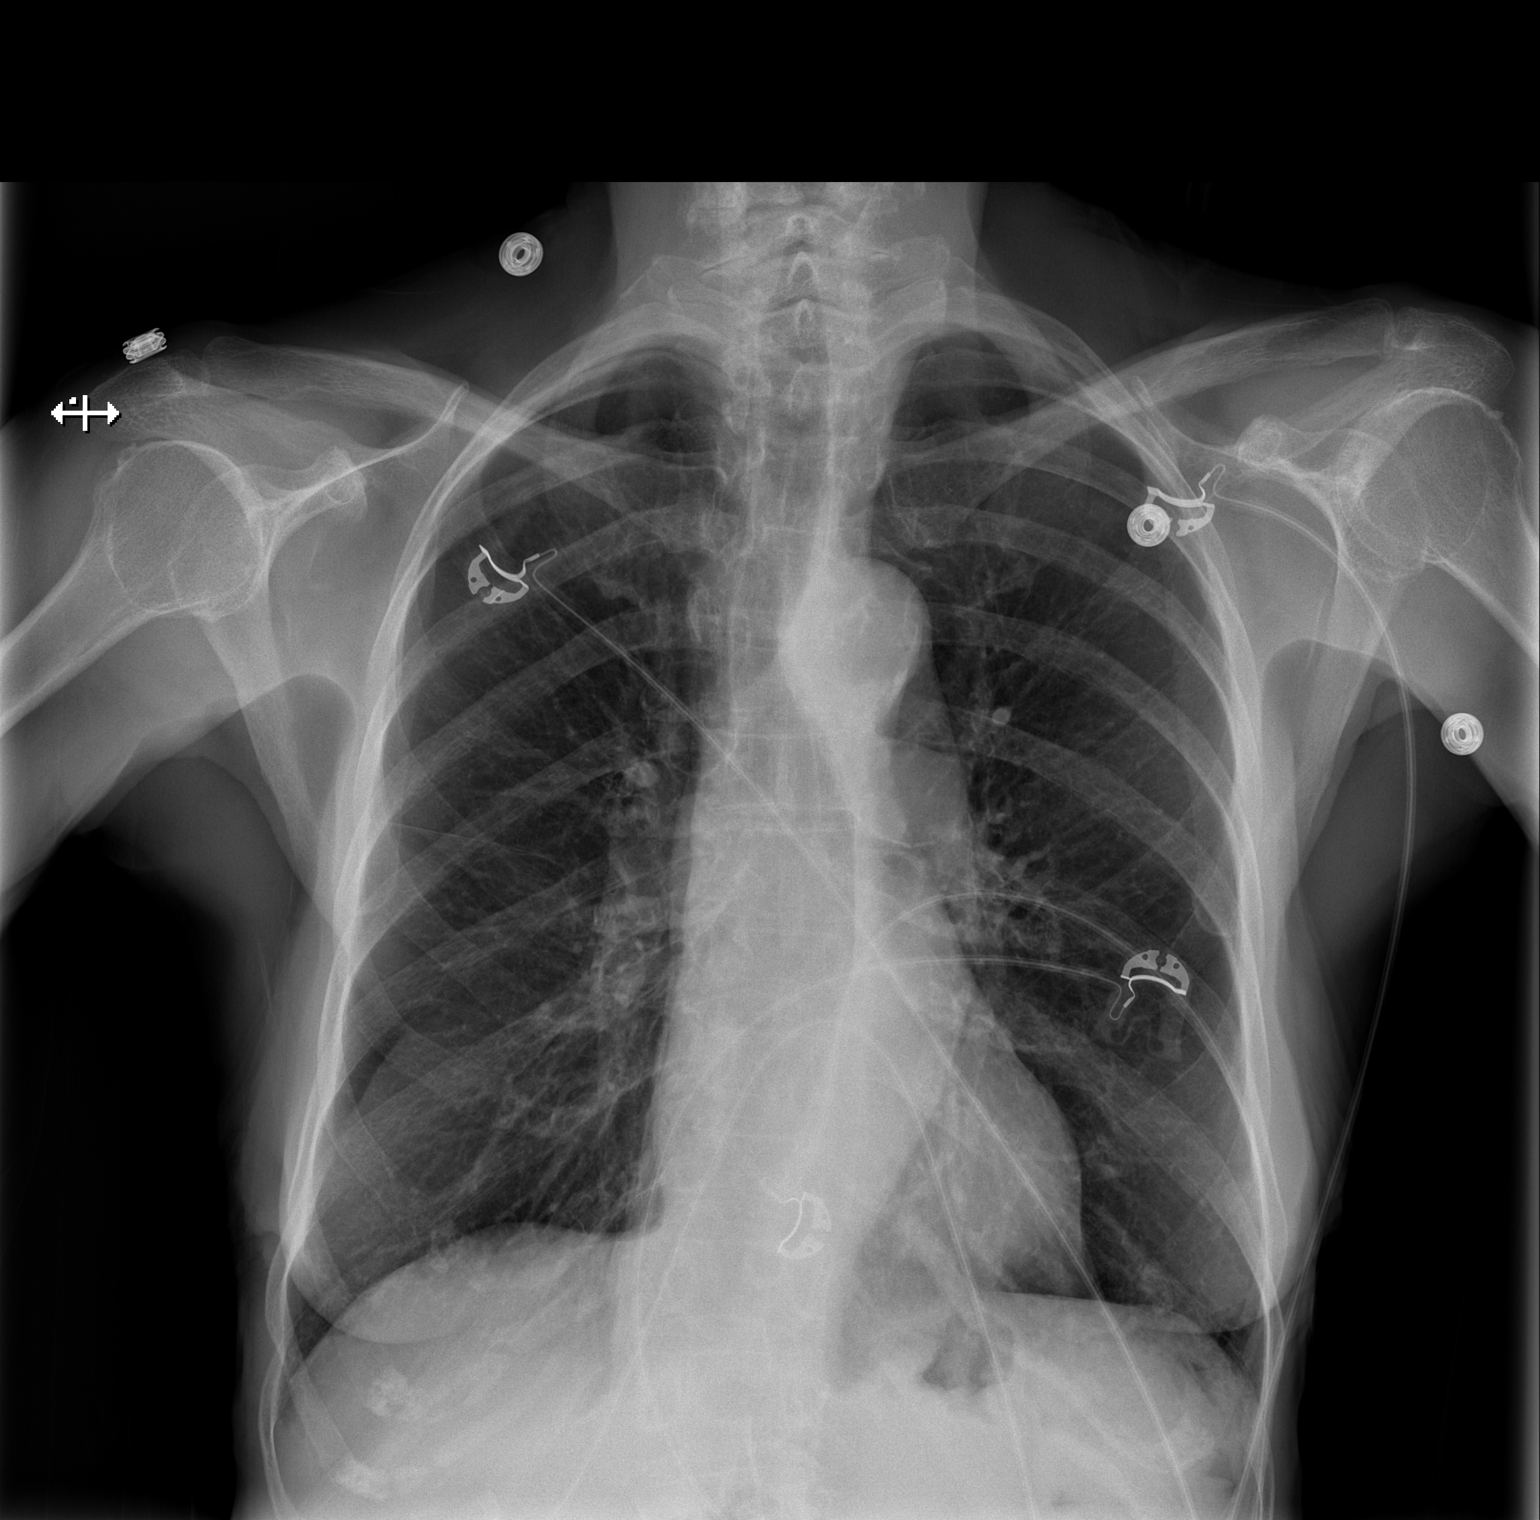

[w chest lat]
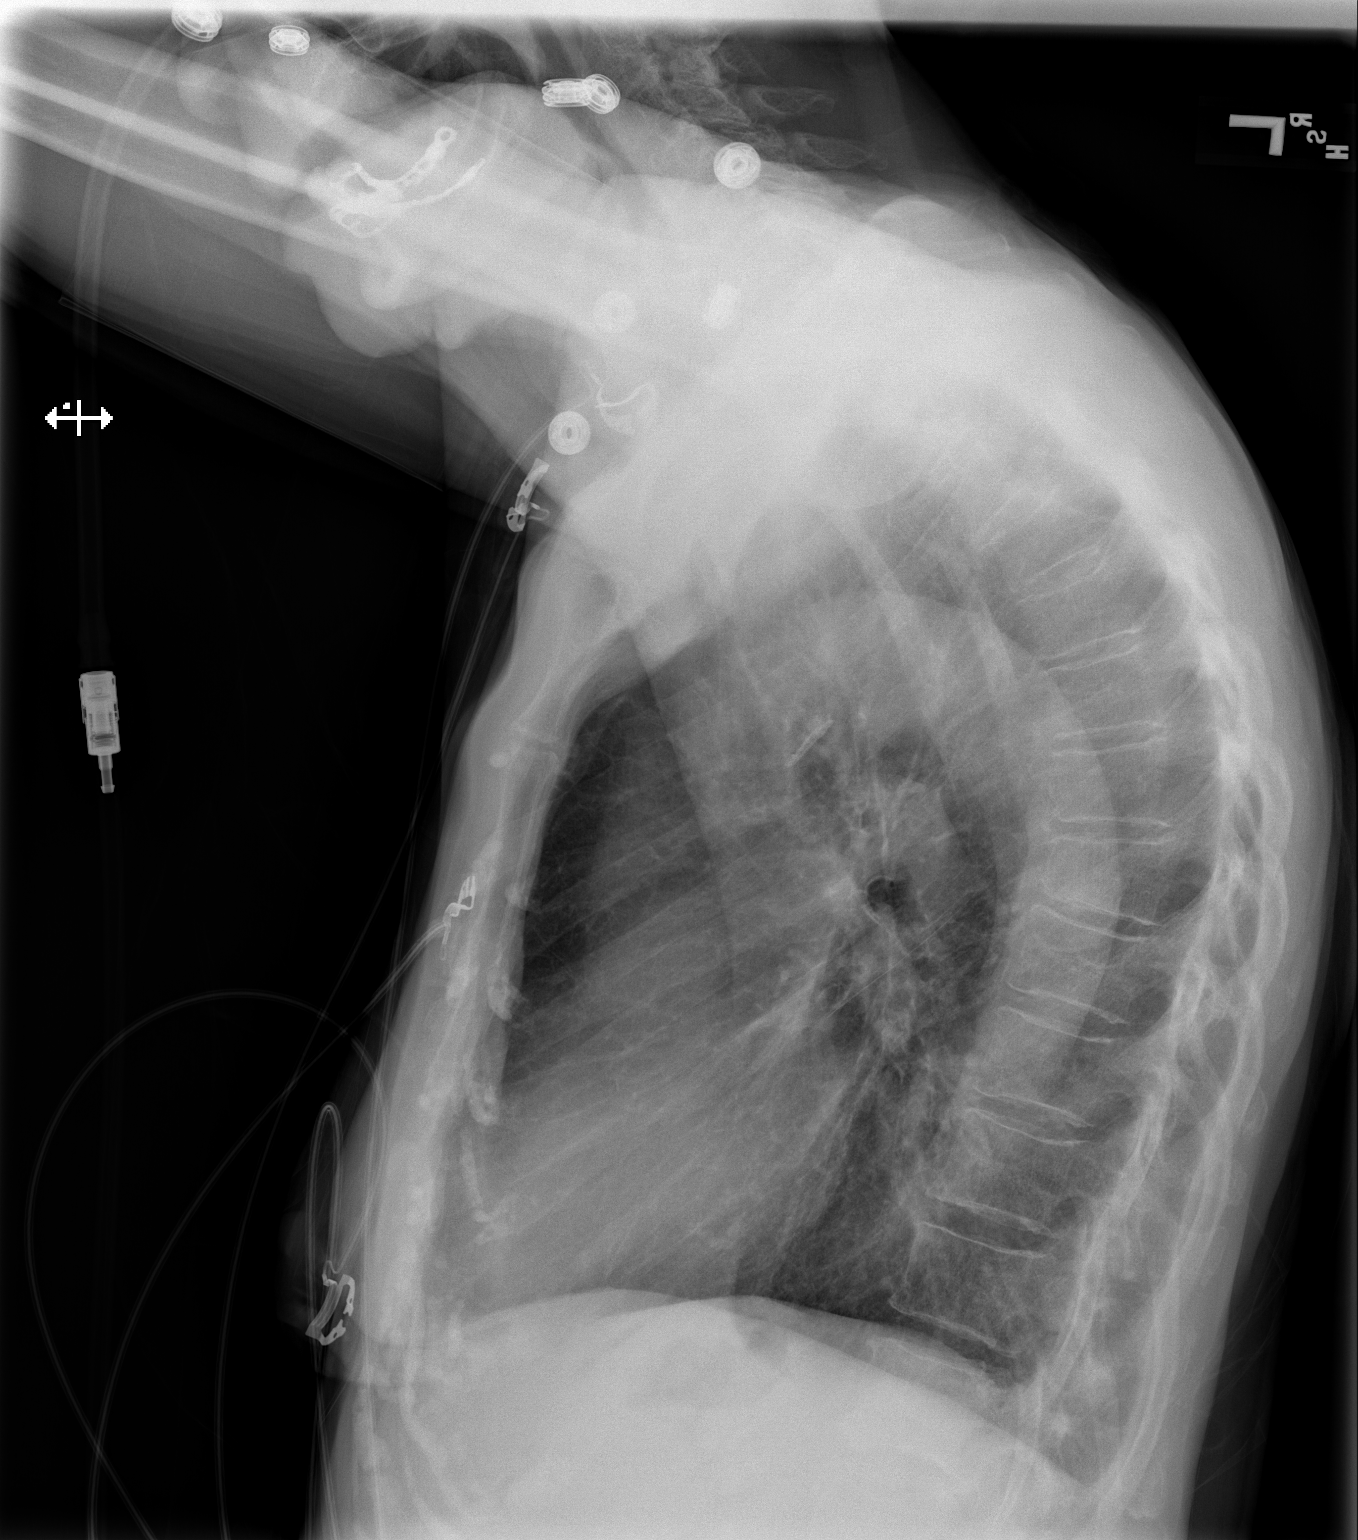

[2 of 2 positions shown; findings below may reference images not displayed]

FINDINGS: Lungs are essentially clear.  No focal consolidation.  No
pleural effusion or pneumothorax.

The heart is normal in size.

Mild degenerative changes of the visualized thoracolumbar spine.
IMPRESSION: No evidence of acute cardiopulmonary disease.
